# Patient Record
Sex: Female | Born: 1990 | Race: Black or African American | Hispanic: No | Marital: Married | Smoking: Never smoker
Health system: Southern US, Community
[De-identification: ages and names within clinical notes are randomized; demographics above are authoritative.]

## PROBLEM LIST (undated history)

## (undated) ENCOUNTER — Emergency Department (HOSPITAL_COMMUNITY): Admission: EM | Payer: No Typology Code available for payment source

## (undated) ENCOUNTER — Inpatient Hospital Stay (HOSPITAL_COMMUNITY): Payer: Self-pay

## (undated) DIAGNOSIS — O210 Mild hyperemesis gravidarum: Secondary | ICD-10-CM

## (undated) DIAGNOSIS — R87629 Unspecified abnormal cytological findings in specimens from vagina: Secondary | ICD-10-CM

## (undated) DIAGNOSIS — Z789 Other specified health status: Secondary | ICD-10-CM

## (undated) HISTORY — PX: NO PAST SURGERIES: SHX2092

---
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
## 2012-01-07 ENCOUNTER — Emergency Department (HOSPITAL_COMMUNITY)
Admission: EM | Admit: 2012-01-07 | Discharge: 2012-01-07 | Disposition: A | Discharge status: Home / Self Care | Payer: Self-pay | Attending: Emergency Medicine | Admitting: Emergency Medicine

## 2012-01-07 ENCOUNTER — Encounter (HOSPITAL_COMMUNITY): Payer: Self-pay | Admitting: Physical Medicine and Rehabilitation

## 2012-01-07 DIAGNOSIS — R350 Frequency of micturition: Secondary | ICD-10-CM | POA: Insufficient documentation

## 2012-01-07 LAB — POCT I-STAT, CHEM 8
BUN: 8 mg/dL (ref 6–23)
Creatinine, Ser: 0.8 mg/dL (ref 0.50–1.10)
Hemoglobin: 12.6 g/dL (ref 12.0–15.0)
Potassium: 4 mEq/L (ref 3.5–5.1)
Sodium: 141 mEq/L (ref 135–145)

## 2012-01-07 LAB — URINALYSIS, ROUTINE W REFLEX MICROSCOPIC
Glucose, UA: NEGATIVE mg/dL
Specific Gravity, Urine: 1.015 (ref 1.005–1.030)
pH: 6 (ref 5.0–8.0)

## 2012-01-07 LAB — URINE MICROSCOPIC-ADD ON

## 2012-01-07 NOTE — ED Provider Notes (Signed)
History     CSN: [id number]  Arrival date & time 01/07/12  1143   First MD Initiated Contact with Patient 01/07/12 1302      Chief Complaint  Patient presents with  . Urinary Frequency    (Consider location/radiation/quality/duration/timing/severity/associated sxs/prior treatment) Patient is a 21 y.o. female presenting with frequency. The history is provided by the patient.  Urinary Frequency   patient complains of 3 months of urinary frequency and urgency. Denies any fever or flank pain. Notes worsening symptoms over the past 3 days. Currently on her menstrual cycle. Denies any severe abdominal pain but does note some suprapubic pressure. Has been on Cipro Floxin and ditropan for same. Patient is currently seeing a physician but is unsure that person's name  No past medical history on file.  No past surgical history on file.  No family history on file.  History  Substance Use Topics  . Smoking status: Never Smoker   . Smokeless tobacco: Not on file  . Alcohol Use: No    OB History    Grav Para Term Preterm Abortions TAB SAB Ect Mult Living                  Review of Systems  Genitourinary: Positive for frequency.  All other systems reviewed and are negative.    Allergies  Review of patient's allergies indicates no known allergies.  Home Medications   Current Outpatient Rx  Name Route Sig Dispense Refill  . CIPROFLOXACIN HCL 500 MG PO TABS Oral Take 500 mg by mouth 2 (two) times daily.    . OXYBUTYNIN CHLORIDE ER 5 MG PO TB24 Oral Take 5 mg by mouth daily.      BP 132/81  Pulse 87  Temp 97.7 F (36.5 C) (Oral)  Resp 18  SpO2 100%  Physical Exam  Nursing note and vitals reviewed. Constitutional: She is oriented to person, place, and time. She appears well-developed and well-nourished.  Non-toxic appearance. No distress.  HENT:  Head: Normocephalic and atraumatic.  Eyes: Conjunctivae normal, EOM and lids are normal. Pupils are equal, round, and  reactive to light.  Neck: Normal range of motion. Neck supple. No tracheal deviation present. No mass present.  Cardiovascular: Normal rate, regular rhythm and normal heart sounds.  Exam reveals no gallop.   No murmur heard. Pulmonary/Chest: Effort normal and breath sounds normal. No stridor. No respiratory distress. She has no decreased breath sounds. She has no wheezes. She has no rhonchi. She has no rales.  Abdominal: Soft. Normal appearance and bowel sounds are normal. She exhibits no distension. There is no tenderness. There is no rigidity, no rebound, no guarding and no CVA tenderness.  Musculoskeletal: Normal range of motion. She exhibits no edema and no tenderness.  Neurological: She is alert and oriented to person, place, and time. She has normal strength. No cranial nerve deficit or sensory deficit. GCS eye subscore is 4. GCS verbal subscore is 5. GCS motor subscore is 6.  Skin: Skin is warm and dry. No abrasion and no rash noted.  Psychiatric: She has a normal mood and affect. Her speech is normal and behavior is normal.    ED Course  Procedures (including critical care time)  Labs Reviewed  URINALYSIS, ROUTINE W REFLEX MICROSCOPIC - Abnormal; Notable for the following:    APPearance HAZY (*)     Hgb urine dipstick LARGE (*)     Leukocytes, UA TRACE (*)     All other components within normal limits  URINE MICROSCOPIC-ADD ON - Abnormal; Notable for the following:    Squamous Epithelial / LPF FEW (*)     All other components within normal limits  URINE CULTURE   No results found.   No diagnosis found.    MDM  Urine pregnancy test negative. Symptoms have been long-standing for 3 months. Patient to be given referral to urology        Toy Baker, MD 01/07/12 1451

## 2012-01-07 NOTE — ED Notes (Signed)
Pt presents to department for evaluation of lower back pain and urinary frequency. Denies dysuria. Symptoms ongoing x3 months, pain has become worse over the past several days. 8/10 pain at the time. She is alert and oriented x4. No signs of distress noted at present.

## 2012-01-08 LAB — URINE CULTURE: Colony Count: 45000

## 2012-01-09 LAB — POCT PREGNANCY, URINE: Preg Test, Ur: NEGATIVE

## 2014-01-02 ENCOUNTER — Other Ambulatory Visit: Payer: Self-pay | Admitting: Infectious Disease

## 2014-01-02 ENCOUNTER — Ambulatory Visit
Admission: RE | Admit: 2014-01-02 | Discharge: 2014-01-02 | Disposition: A | Discharge status: Home / Self Care | Payer: No Typology Code available for payment source | Source: Ambulatory Visit | Attending: Infectious Disease | Admitting: Infectious Disease

## 2014-01-02 DIAGNOSIS — R7611 Nonspecific reaction to tuberculin skin test without active tuberculosis: Secondary | ICD-10-CM

## 2016-04-18 NOTE — L&D Delivery Note (Signed)
Delivery Note At 12:26 PM a viable female was delivered via Vaginal, Spontaneous (Presentation: ROA).  APGAR: 8, 10; weight pending.   Placenta status: Spontaneous with trailing membranes removed after delivery of placenta.  Cord: 3 vessels  After delivery of placenta, fundus remained boggy with brisk bright red blood flow. Pitocin bolus started, 800mg  cytotec, and methergine 0.2 given. Fundus then firm with minimal bleeding.   Third degree laceration noted. Dr. Alvester MorinNewton at bedside for repair  Anesthesia:  none Episiotomy: None Lacerations: 3rd degree, repaired by Dr. Alvester MorinNewton Suture Repair: 3.0 vicryl Est. Blood Loss (mL): 300  Mom to postpartum.  Baby to Couplet care / Skin to Skin.  Rolm BookbinderCaroline M Neill CNM 03/22/2017, 12:56 PM  Please schedule this patient for PP visit in: 4 weeks Low risk pregnancy complicated by: n/a Delivery mode:  SVD Anticipated Birth Control:  other/unsure PP Procedures needed: vaginal laceration checked  Schedule Integrated BH visit: no Provider: Any provider

## 2016-07-29 ENCOUNTER — Encounter (HOSPITAL_COMMUNITY): Payer: Self-pay | Admitting: Emergency Medicine

## 2016-07-29 ENCOUNTER — Emergency Department (HOSPITAL_COMMUNITY)
Admission: EM | Admit: 2016-07-29 | Discharge: 2016-07-29 | Disposition: A | Discharge status: Home / Self Care | Payer: Medicaid Other | Attending: Emergency Medicine | Admitting: Emergency Medicine

## 2016-07-29 DIAGNOSIS — Z3A01 Less than 8 weeks gestation of pregnancy: Secondary | ICD-10-CM | POA: Insufficient documentation

## 2016-07-29 DIAGNOSIS — O209 Hemorrhage in early pregnancy, unspecified: Secondary | ICD-10-CM | POA: Diagnosis not present

## 2016-07-29 DIAGNOSIS — O4690 Antepartum hemorrhage, unspecified, unspecified trimester: Secondary | ICD-10-CM

## 2016-07-29 DIAGNOSIS — N9489 Other specified conditions associated with female genital organs and menstrual cycle: Secondary | ICD-10-CM | POA: Insufficient documentation

## 2016-07-29 LAB — WET PREP, GENITAL
Clue Cells Wet Prep HPF POC: NONE SEEN
Trich, Wet Prep: NONE SEEN
Yeast Wet Prep HPF POC: NONE SEEN

## 2016-07-29 LAB — URINALYSIS, ROUTINE W REFLEX MICROSCOPIC
Bilirubin Urine: NEGATIVE
Glucose, UA: NEGATIVE mg/dL
Hgb urine dipstick: NEGATIVE
KETONES UR: NEGATIVE mg/dL
LEUKOCYTES UA: NEGATIVE
Nitrite: NEGATIVE
PROTEIN: NEGATIVE mg/dL
Specific Gravity, Urine: 1.009 (ref 1.005–1.030)
pH: 6 (ref 5.0–8.0)

## 2016-07-29 LAB — HCG, QUANTITATIVE, PREGNANCY: HCG, BETA CHAIN, QUANT, S: 12364 m[IU]/mL — AB (ref <5)

## 2016-07-29 LAB — ABO/RH: ABO/RH(D): O POS

## 2016-07-29 MED ORDER — SODIUM CHLORIDE 0.9 % IV BOLUS (SEPSIS)
1000.0000 mL | Freq: Once | INTRAVENOUS | Status: AC
  Administered 2016-07-29: 1000 mL via INTRAVENOUS

## 2016-07-29 NOTE — ED Provider Notes (Signed)
Patient's pelvic exam reveals no bleeding or discharge wet prep reviewed all within normal parameters hCG reveals a quantitative level of 12,364 she is O+ She was put on pelvic rest with follow-up with OB/GYN.   Earley Favor, NP 07/29/16 2246    Marily Memos, MD 07/29/16 [phone]

## 2016-07-29 NOTE — Discharge Instructions (Signed)
Tonight her examination is normal I recommend pelvic rest until you're seen by her OB/GYN give been given a referral to [facility] please: Make an appointment.

## 2016-07-29 NOTE — ED Provider Notes (Signed)
[facility]-EMERGENCY DEPT Provider Note   CSN: [id number] Arrival date & time: 07/29/16  1431     History   Chief Complaint Chief Complaint  Patient presents with  . Vaginal Bleeding    HPI Lisa Reese is a 26 y.o. female.  Pt presents w intermittent vaginal bleeding that began today. Describes bleeding to be a small amount on tissue. Reports positive home pregnancy test on April 1 and yesterday. Denies prenatal care or prenatal vitamins. Denies abd pain, dysuria, F/C.       History reviewed. No pertinent past medical history.  There are no active problems to display for this patient.   History reviewed. No pertinent surgical history.  OB History    Gravida Para Term Preterm AB Living   1             SAB TAB Ectopic Multiple Live Births                   Home Medications    Prior to Admission medications   Not on File    Family History History reviewed. No pertinent family history.  Social History Social History  Substance Use Topics  . Smoking status: Never Smoker  . Smokeless tobacco: Never Used  . Alcohol use No     Allergies   Patient has no known allergies.   Review of Systems Review of Systems  Constitutional: Negative for chills and fever.  Gastrointestinal: Negative for abdominal pain.  Genitourinary: Positive for vaginal bleeding. Negative for dysuria and pelvic pain.     Physical Exam Updated Vital Signs BP 119/79 (BP Location: Left Arm)   Pulse 76   Temp 98.6 F (37 C) (Oral)   Resp 16   Ht  (1.549 m)   Wt 63.5 kg   SpO2 100%   BMI 26.45 kg/m   Physical Exam  Constitutional: She appears well-developed and well-nourished.  HENT:  Head: Normocephalic and atraumatic.  Eyes: Conjunctivae are normal.  Cardiovascular: Normal rate, regular rhythm, normal heart sounds and intact distal pulses.  Exam reveals no friction rub.   No murmur heard. Pulmonary/Chest: Effort normal.  Abdominal: Soft. Bowel sounds are normal.  She exhibits no distension. There is no tenderness.  Psychiatric: She has a normal mood and affect. Her behavior is normal.  Nursing note and vitals reviewed.    ED Treatments / Results  Labs (all labs ordered are listed, but only abnormal results are displayed) Labs Reviewed  WET PREP, GENITAL  HCG, QUANTITATIVE, PREGNANCY  ABO/RH  GC/CHLAMYDIA PROBE AMP () NOT AT [facility]    EKG  EKG Interpretation None       Radiology No results found.  Procedures Procedures (including critical care time)  Medications Ordered in ED Medications  sodium chloride 0.9 % bolus 1,000 mL (not administered)     Initial Impression / Assessment and Plan / ED Course  I have reviewed the triage vital signs and the nursing notes.  Pertinent labs & imaging results that were available during my care of the patient were reviewed by me and considered in my medical decision making (see chart for details).     Pt w mild vaginal bleeding that began today. Positive home pregnancy tests x2. No abdl pain. Physical exam w/o abdl tenderness. HCG quantitative pending.   Care assumed by Earley Favor, NP for further workup.   Pt discussed w Dr. Clayborne Dana.  Final Clinical Impressions(s) / ED Diagnoses   Final diagnoses:  None  New Prescriptions New Prescriptions   No medications on file     [place] N Russo, PA-C 07/29/16 2027    [place] N Russo, PA-C 07/29/16 2029    Marily Memos, MD 07/29/16 [phone]

## 2016-07-29 NOTE — ED Triage Notes (Signed)
Pt presents to ED for assessment of blood noted on the napkin when wiping today.   Patient states she just found out she is pregnant as of yesterday, LMP lasrt month.  Pt denies any other associated symptoms.

## 2016-08-01 LAB — GC/CHLAMYDIA PROBE AMP (~~LOC~~) NOT AT ARMC
Chlamydia: NEGATIVE
Neisseria Gonorrhea: NEGATIVE

## 2016-08-06 ENCOUNTER — Encounter (HOSPITAL_COMMUNITY): Payer: Self-pay | Admitting: *Deleted

## 2016-08-06 ENCOUNTER — Inpatient Hospital Stay (HOSPITAL_COMMUNITY)
Admission: AD | Admit: 2016-08-06 | Discharge: 2016-08-07 | Disposition: A | Discharge status: Home / Self Care | Payer: Medicaid Other | Source: Ambulatory Visit | Attending: Obstetrics and Gynecology | Admitting: Obstetrics and Gynecology

## 2016-08-06 DIAGNOSIS — O99611 Diseases of the digestive system complicating pregnancy, first trimester: Secondary | ICD-10-CM | POA: Diagnosis not present

## 2016-08-06 DIAGNOSIS — Z3A01 Less than 8 weeks gestation of pregnancy: Secondary | ICD-10-CM | POA: Diagnosis not present

## 2016-08-06 DIAGNOSIS — O26891 Other specified pregnancy related conditions, first trimester: Secondary | ICD-10-CM

## 2016-08-06 DIAGNOSIS — O219 Vomiting of pregnancy, unspecified: Secondary | ICD-10-CM

## 2016-08-06 DIAGNOSIS — R12 Heartburn: Secondary | ICD-10-CM | POA: Insufficient documentation

## 2016-08-06 LAB — COMPREHENSIVE METABOLIC PANEL
ALK PHOS: 45 U/L (ref 38–126)
ALT: 14 U/L (ref 14–54)
AST: 20 U/L (ref 15–41)
Albumin: 4.5 g/dL (ref 3.5–5.0)
Anion gap: 8 (ref 5–15)
BILIRUBIN TOTAL: 0.5 mg/dL (ref 0.3–1.2)
BUN: 13 mg/dL (ref 6–20)
CALCIUM: 9.6 mg/dL (ref 8.9–10.3)
CHLORIDE: 105 mmol/L (ref 101–111)
CO2: 25 mmol/L (ref 22–32)
CREATININE: 0.71 mg/dL (ref 0.44–1.00)
GFR calc Af Amer: >60 mL/min (ref >60)
Glucose, Bld: 101 mg/dL — ABNORMAL HIGH (ref 65–99)
Potassium: 3.6 mmol/L (ref 3.5–5.1)
Sodium: 138 mmol/L (ref 135–145)
Total Protein: 9 g/dL — ABNORMAL HIGH (ref 6.5–8.1)

## 2016-08-06 LAB — CBC
HEMATOCRIT: 35.5 % — AB (ref 36.0–46.0)
HEMOGLOBIN: 12.2 g/dL (ref 12.0–15.0)
MCH: 30 pg (ref 26.0–34.0)
MCHC: 34.4 g/dL (ref 30.0–36.0)
MCV: 87.4 fL (ref 78.0–100.0)
PLATELETS: 340 10*3/uL (ref 150–400)
RBC: 4.06 MIL/uL (ref 3.87–5.11)
RDW: 12.6 % (ref 11.5–15.5)
WBC: 7.5 10*3/uL (ref 4.0–10.5)

## 2016-08-06 LAB — URINALYSIS, ROUTINE W REFLEX MICROSCOPIC
Bilirubin Urine: NEGATIVE
Glucose, UA: NEGATIVE mg/dL
Hgb urine dipstick: NEGATIVE
Ketones, ur: 80 mg/dL — AB
Leukocytes, UA: NEGATIVE
Nitrite: NEGATIVE
Protein, ur: 100 mg/dL — AB
SPECIFIC GRAVITY, URINE: 1.03 (ref 1.005–1.030)
pH: 6 (ref 5.0–8.0)

## 2016-08-06 MED ORDER — DEXTROSE 5 % IN LACTATED RINGERS IV BOLUS
1000.0000 mL | Freq: Once | INTRAVENOUS | Status: AC
  Administered 2016-08-06: 1000 mL via INTRAVENOUS

## 2016-08-06 MED ORDER — FAMOTIDINE IN NACL 20-0.9 MG/50ML-% IV SOLN
20.0000 mg | Freq: Once | INTRAVENOUS | Status: AC
  Administered 2016-08-06: 20 mg via INTRAVENOUS
  Filled 2016-08-06: qty 50

## 2016-08-06 MED ORDER — PROMETHAZINE HCL 25 MG/ML IJ SOLN
25.0000 mg | Freq: Once | INTRAMUSCULAR | Status: AC
  Administered 2016-08-06: 25 mg via INTRAVENOUS
  Filled 2016-08-06: qty 1

## 2016-08-06 NOTE — MAU Provider Note (Signed)
Chief Complaint: Emesis During Pregnancy   First Provider Initiated Contact with Patient 08/06/16 2311      SUBJECTIVE HPI: Lisa Reese is a 26 y.o. G1P0 at [redacted]w[redacted]d by LMP who presents to maternity admissions reporting nausea/vomiting x 4 days with dark blood in her emesis today and chest burning.  She reports vomiting 4-5 times in 24 hours and being unable to keep down any food or fluids.  She has tried eating small bland meals which has not helped. She has not tried any medications. She has a New OB appointment scheduled in [facility] next month.  She denies abdominal pain, vaginal bleeding, or any other symptoms. She denies vaginal itching/burning, urinary symptoms, h/a, dizziness,or fever/chills.     HPI  Past Medical History:  Diagnosis Date  . Medical history non-contributory    Past Surgical History:  Procedure Laterality Date  . NO PAST SURGERIES     Social History   Social History  . Marital status: Married    Spouse name: N/A  . Number of children: N/A  . Years of education: N/A   Occupational History  . Not on file.   Social History Main Topics  . Smoking status: Never Smoker  . Smokeless tobacco: Never Used  . Alcohol use No  . Drug use: No  . Sexual activity: Yes    Birth control/ protection: None   Other Topics Concern  . Not on file   Social History Narrative  . No narrative on file   No current facility-administered medications on file prior to encounter.    No current outpatient prescriptions on file prior to encounter.   No Known Allergies  ROS:  Review of Systems  Constitutional: Negative for chills, fatigue and fever.  Respiratory: Negative for shortness of breath.   Cardiovascular: Negative for chest pain.  Gastrointestinal: Positive for nausea and vomiting. Negative for constipation and diarrhea.  Genitourinary: Negative for difficulty urinating, dysuria, flank pain, pelvic pain, vaginal bleeding, vaginal discharge and vaginal pain.   Neurological: Negative for dizziness and headaches.  Psychiatric/Behavioral: Negative.      I have reviewed patient's Past Medical Hx, Surgical Hx, Family Hx, Social Hx, medications and allergies.   Physical Exam   Patient Vitals for the past 24 hrs:  BP Temp Temp src Pulse Resp  08/07/16 0152 (!) 101/47 - - 85 18  08/06/16 2213 117/68 97.9 F (36.6 C) Oral 81 18   Constitutional: Well-developed, well-nourished female in moderate distress.  Cardiovascular: normal rate Respiratory: normal effort GI: Abd soft, non-tender. Pos BS x 4 MS: Extremities nontender, no edema, normal ROM Neurologic: Alert and oriented x 4.  GU: Neg CVAT.  PELVIC EXAM: Deferred   LAB RESULTS Results for orders placed or performed during the hospital encounter of 08/06/16 (from the past 24 hour(s))  Urinalysis, Routine w reflex microscopic     Status: Abnormal   Collection Time: 08/06/16 10:18 PM  Result Value Ref Range   Color, Urine YELLOW YELLOW   APPearance HAZY (A) CLEAR   Specific Gravity, Urine 1.030 1.005 - 1.030   pH 6.0 5.0 - 8.0   Glucose, UA NEGATIVE NEGATIVE mg/dL   Hgb urine dipstick NEGATIVE NEGATIVE   Bilirubin Urine NEGATIVE NEGATIVE   Ketones, ur 80 (A) NEGATIVE mg/dL   Protein, ur 161 (A) NEGATIVE mg/dL   Nitrite NEGATIVE NEGATIVE   Leukocytes, UA NEGATIVE NEGATIVE   RBC / HPF 0-5 0 - 5 RBC/hpf   WBC, UA 0-5 0 - 5 WBC/hpf  Bacteria, UA RARE (A) NONE SEEN   Squamous Epithelial / LPF 6-30 (A) NONE SEEN   Mucous PRESENT   CBC     Status: Abnormal   Collection Time: 08/06/16 10:54 PM  Result Value Ref Range   WBC 7.5 4.0 - 10.5 K/uL   RBC 4.06 3.87 - 5.11 MIL/uL   Hemoglobin 12.2 12.0 - 15.0 g/dL   HCT 16.1 (L) 09.6 - 04.5 %   MCV 87.4 78.0 - 100.0 fL   MCH 30.0 26.0 - 34.0 pg   MCHC 34.4 30.0 - 36.0 g/dL   RDW 40.9 81.1 - 91.4 %   Platelets 340 150 - 400 K/uL  Comprehensive metabolic panel     Status: Abnormal   Collection Time: 08/06/16 10:54 PM  Result Value Ref  Range   Sodium 138 135 - 145 mmol/L   Potassium 3.6 3.5 - 5.1 mmol/L   Chloride 105 101 - 111 mmol/L   CO2 25 22 - 32 mmol/L   Glucose, Bld 101 (H) 65 - 99 mg/dL   BUN 13 6 - 20 mg/dL   Creatinine, Ser 7.82 0.44 - 1.00 mg/dL   Calcium 9.6 8.9 - 95.6 mg/dL   Total Protein 9.0 (H) 6.5 - 8.1 g/dL   Albumin 4.5 3.5 - 5.0 g/dL   AST 20 15 - 41 U/L   ALT 14 14 - 54 U/L   Alkaline Phosphatase 45 38 - 126 U/L   Total Bilirubin 0.5 0.3 - 1.2 mg/dL   GFR calc non Af Amer >60 >60 mL/min   GFR calc Af Amer >60 >60 mL/min   Anion gap 8 5 - 15    --/--/O POS (04/13 2012)  IMAGING No results found.  MAU Management/MDM: Ordered CBC, CMP, UA and reviewed results.  D5LR x 1000 ml, Phenergan 25 mg IV, and Pepcid 20 mg IV given with complete resolution of symptoms. Rx for Phenergan 12.5-25 mg PO Q 6 hours and Zantac 150 mg PO BID.  FU with prenatal care as scheduled. Return to MAU as needed for emergencies.  Pt stable at time of discharge.  ASSESSMENT 1. Nausea and vomiting during pregnancy prior to [redacted] weeks gestation   2. Heartburn during pregnancy in first trimester     PLAN Discharge home Allergies as of 08/07/2016   No Known Allergies     Medication List    TAKE these medications   promethazine 25 MG tablet Commonly known as:  PHENERGAN Take 0.5-1 tablets (12.5-25 mg total) by mouth every 6 (six) hours as needed for nausea.   ranitidine 150 MG tablet Commonly known as:  ZANTAC Take 1 tablet (150 mg total) by mouth 2 (two) times daily.      Follow-up Information    [facility] Follow up.   Specialty:  Obstetrics and Gynecology Why:  As scheduled, return to MAU as needed for emergencies Contact information: [address] [place] [address] [phone]          Sharen Counter Certified Nurse-Midwife 08/07/2016  2:55 AM

## 2016-08-06 NOTE — MAU Note (Signed)
p reports vomiting x 4 days. Stated emesis looks like coffee grounds now. C/o throat pain and burning in her chest.

## 2016-08-07 DIAGNOSIS — O219 Vomiting of pregnancy, unspecified: Secondary | ICD-10-CM

## 2016-08-07 MED ORDER — LACTATED RINGERS IV BOLUS (SEPSIS)
1000.0000 mL | Freq: Once | INTRAVENOUS | Status: AC
  Administered 2016-08-07: 1000 mL via INTRAVENOUS

## 2016-08-07 MED ORDER — PROMETHAZINE HCL 25 MG PO TABS: 12.5000 mg | ORAL_TABLET | Freq: Four times a day (QID) | ORAL | 2 refills | Status: DC | PRN

## 2016-08-07 MED ORDER — RANITIDINE HCL 150 MG PO TABS: 150.0000 mg | ORAL_TABLET | Freq: Two times a day (BID) | ORAL | 3 refills | Status: DC

## 2016-08-27 ENCOUNTER — Encounter (HOSPITAL_COMMUNITY): Payer: Self-pay | Admitting: Emergency Medicine

## 2016-08-27 DIAGNOSIS — R1030 Lower abdominal pain, unspecified: Secondary | ICD-10-CM | POA: Insufficient documentation

## 2016-08-27 DIAGNOSIS — O219 Vomiting of pregnancy, unspecified: Secondary | ICD-10-CM | POA: Insufficient documentation

## 2016-08-27 DIAGNOSIS — Z3A09 9 weeks gestation of pregnancy: Secondary | ICD-10-CM | POA: Insufficient documentation

## 2016-08-27 LAB — COMPREHENSIVE METABOLIC PANEL
ALBUMIN: 4.1 g/dL (ref 3.5–5.0)
ALK PHOS: 37 U/L — AB (ref 38–126)
ALT: 16 U/L (ref 14–54)
AST: 19 U/L (ref 15–41)
Anion gap: 9 (ref 5–15)
BILIRUBIN TOTAL: 0.6 mg/dL (ref 0.3–1.2)
BUN: 10 mg/dL (ref 6–20)
CO2: 22 mmol/L (ref 22–32)
Calcium: 9.7 mg/dL (ref 8.9–10.3)
Chloride: 102 mmol/L (ref 101–111)
Creatinine, Ser: 0.71 mg/dL (ref 0.44–1.00)
GFR calc Af Amer: >60 mL/min (ref >60)
GFR calc non Af Amer: >60 mL/min (ref >60)
GLUCOSE: 97 mg/dL (ref 65–99)
POTASSIUM: 3.4 mmol/L — AB (ref 3.5–5.1)
Sodium: 133 mmol/L — ABNORMAL LOW (ref 135–145)
TOTAL PROTEIN: 8.1 g/dL (ref 6.5–8.1)

## 2016-08-27 LAB — CBC
HEMATOCRIT: 32.9 % — AB (ref 36.0–46.0)
Hemoglobin: 11.2 g/dL — ABNORMAL LOW (ref 12.0–15.0)
MCH: 29.9 pg (ref 26.0–34.0)
MCHC: 34 g/dL (ref 30.0–36.0)
MCV: 87.7 fL (ref 78.0–100.0)
PLATELETS: 327 10*3/uL (ref 150–400)
RBC: 3.75 MIL/uL — ABNORMAL LOW (ref 3.87–5.11)
RDW: 12.7 % (ref 11.5–15.5)
WBC: 6.8 10*3/uL (ref 4.0–10.5)

## 2016-08-27 LAB — URINALYSIS, ROUTINE W REFLEX MICROSCOPIC
BILIRUBIN URINE: NEGATIVE
Glucose, UA: NEGATIVE mg/dL
Ketones, ur: 80 mg/dL — AB
Leukocytes, UA: NEGATIVE
Nitrite: NEGATIVE
PH: 5 (ref 5.0–8.0)
Protein, ur: 30 mg/dL — AB
SPECIFIC GRAVITY, URINE: 1.03 (ref 1.005–1.030)

## 2016-08-27 LAB — I-STAT BETA HCG BLOOD, ED (MC, WL, AP ONLY): I-stat hCG, quantitative: >2000 m[IU]/mL — ABNORMAL HIGH (ref <5)

## 2016-08-27 NOTE — ED Notes (Addendum)
No urine in cup, order cancelled per Lab.  Will reorder.

## 2016-08-27 NOTE — ED Triage Notes (Signed)
Pt presents to ED for assessment of streaks of blood noted in her emesis.  Pt is currently pregnant (approx 1.5 months).  Has not seen an OB/Gyn yet. States constant nausea during pregnancy with difficulty eating or drinking.  Denies any pain.

## 2016-08-28 ENCOUNTER — Emergency Department (HOSPITAL_COMMUNITY)
Admission: EM | Admit: 2016-08-28 | Discharge: 2016-08-28 | Disposition: A | Discharge status: Home / Self Care | Payer: Medicaid Other | Attending: Emergency Medicine | Admitting: Emergency Medicine

## 2016-08-28 ENCOUNTER — Emergency Department (HOSPITAL_COMMUNITY): Payer: Medicaid Other

## 2016-08-28 DIAGNOSIS — R112 Nausea with vomiting, unspecified: Secondary | ICD-10-CM

## 2016-08-28 MED ORDER — SODIUM CHLORIDE 0.9 % IV SOLN
1000.0000 mL | Freq: Once | INTRAVENOUS | Status: AC
  Administered 2016-08-28: 1000 mL via INTRAVENOUS

## 2016-08-28 MED ORDER — DOXYLAMINE SUCCINATE (SLEEP) 25 MG PO TABS: 25.0000 mg | ORAL_TABLET | Freq: Three times a day (TID) | ORAL | 0 refills | Status: DC | PRN

## 2016-08-28 MED ORDER — SODIUM CHLORIDE 0.9 % IV SOLN
1000.0000 mL | INTRAVENOUS | Status: DC
Start: 2016-08-28 — End: 2016-08-28
  Administered 2016-08-28: 1000 mL via INTRAVENOUS

## 2016-08-28 MED ORDER — ONDANSETRON HCL 4 MG/2ML IJ SOLN
4.0000 mg | Freq: Once | INTRAMUSCULAR | Status: AC
  Administered 2016-08-28: 4 mg via INTRAVENOUS
  Filled 2016-08-28: qty 2

## 2016-08-28 NOTE — ED Notes (Signed)
Pt returned from Ultrasound.

## 2016-08-28 NOTE — ED Provider Notes (Signed)
[facility] DEPT Provider Note   CSN: [id number] Arrival date & time: 08/27/16  1954  By signing my name below, I, Lisa Reese, attest that this documentation has been prepared under the direction and in the presence of physician practitioner, Azalia Bilisampos, Shera Laubach, MD. Electronically Signed: Linna Darnerussell Reese, Scribe. 08/28/2016. 1:09 AM.  History   Chief Complaint Chief Complaint  Patient presents with  . Emesis  . Hematemesis   The history is provided by the patient. No language interpreter was used.    HPI Comments: Lisa Reese is a 26 y.o. female who presents to the Emergency Department complaining of persistent nausea and vomiting beginning on 08/26/16. Patient states her emesis has been both bloody and non-bloody but was non-bloody initially. Patient reports some suprapubic pain secondary to vomiting as well as some urinary urgency with a couple of episodes of incontinence. No alleviating factors noted. Patient discovered that she was pregnant last month and has not had any ultrasounds or evaluations by an OB/GYN. She denies diarrhea, dysuria, difficulty urinating, or any other associated symptoms.  Past Medical History:  Diagnosis Date  . Medical history non-contributory     There are no active problems to display for this patient.   Past Surgical History:  Procedure Laterality Date  . NO PAST SURGERIES      OB History    Gravida Para Term Preterm AB Living   1             SAB TAB Ectopic Multiple Live Births                   Home Medications    Prior to Admission medications   Medication Sig Start Date End Date Taking? Authorizing Provider  promethazine (PHENERGAN) 25 MG tablet Take 0.5-1 tablets (12.5-25 mg total) by mouth every 6 (six) hours as needed for nausea. 08/07/16   Leftwich-Kirby, Wilmer FloorLisa A, CNM  ranitidine (ZANTAC) 150 MG tablet Take 1 tablet (150 mg total) by mouth 2 (two) times daily. 08/07/16   Leftwich-Kirby, Wilmer FloorLisa A, CNM    Family  History History reviewed. No pertinent family history.  Social History Social History  Substance Use Topics  . Smoking status: Never Smoker  . Smokeless tobacco: Never Used  . Alcohol use No     Allergies   Patient has no known allergies.   Review of Systems Review of Systems  All other systems reviewed and are negative for acute change except as noted in the HPI. Physical Exam Updated Vital Signs BP 111/60 (BP Location: Right Arm)   Pulse 82   Temp 98.2 F (36.8 C) (Oral)   Resp 12   LMP 06/22/2016   SpO2 100%   Physical Exam  Constitutional: She is oriented to person, place, and time. She appears well-developed and well-nourished. No distress.  HENT:  Head: Normocephalic and atraumatic.  Eyes: EOM are normal.  Neck: Normal range of motion.  Cardiovascular: Normal rate, regular rhythm and normal heart sounds.   Pulmonary/Chest: Effort normal and breath sounds normal.  Abdominal: Soft. She exhibits no distension. There is tenderness.  Lower abdominal tenderness.  Musculoskeletal: Normal range of motion.  Neurological: She is alert and oriented to person, place, and time.  Skin: Skin is warm and dry.  Psychiatric: She has a normal mood and affect. Judgment normal.  Nursing note and vitals reviewed.  ED Treatments / Results  Labs (all labs ordered are listed, but only abnormal results are displayed) Labs Reviewed  COMPREHENSIVE METABOLIC PANEL - Abnormal; Notable  for the following:       Result Value   Sodium 133 (*)    Potassium 3.4 (*)    Alkaline Phosphatase 37 (*)    All other components within normal limits  CBC - Abnormal; Notable for the following:    RBC 3.75 (*)    Hemoglobin 11.2 (*)    HCT 32.9 (*)    All other components within normal limits  URINALYSIS, ROUTINE W REFLEX MICROSCOPIC - Abnormal; Notable for the following:    APPearance HAZY (*)    Hgb urine dipstick SMALL (*)    Ketones, ur 80 (*)    Protein, ur 30 (*)    Bacteria, UA RARE (*)     Squamous Epithelial / LPF 0-5 (*)    All other components within normal limits  I-STAT BETA HCG BLOOD, ED ([facility], [facility], [facility] ONLY) - Abnormal; Notable for the following:    I-stat hCG, quantitative >2,000.0 (*)    All other components within normal limits    EKG  EKG Interpretation None       Radiology No results found.  Procedures Procedures (including critical care time)  DIAGNOSTIC STUDIES: Oxygen Saturation is 100% on RA, normal by my interpretation.    COORDINATION OF CARE: 1:08 AM Discussed treatment plan with pt at bedside and pt agreed to plan.  Medications Ordered in ED Medications  0.9 %  sodium chloride infusion (0 mLs Intravenous Stopped 08/28/16 0444)    Followed by  0.9 %  sodium chloride infusion (1,000 mLs Intravenous New Bag/Given 08/28/16 0448)  ondansetron (ZOFRAN) injection 4 mg (4 mg Intravenous Given 08/28/16 0120)     Initial Impression / Assessment and Plan / ED Course  I have reviewed the triage vital signs and the nursing notes.  Pertinent labs & imaging results that were available during my care of the patient were reviewed by me and considered in my medical decision making (see chart for details).     Vomiting with likely mild Mallory-Weiss tear.  No hematemesis while in the emergency department.  Feels much better after fluids and nausea medication.  Close OB follow-up.  Normal intrauterine pregnancy.  Home with [employer]  Final Clinical Impressions(s) / ED Diagnoses   Final diagnoses:  Non-intractable vomiting with nausea, unspecified vomiting type    New Prescriptions New Prescriptions   DOXYLAMINE, SLEEP, (UNISOM) 25 MG TABLET    Take 1 tablet (25 mg total) by mouth every 8 (eight) hours as needed (nausea).   I personally performed the services described in this documentation, which was scribed in my presence. The recorded information has been reviewed and is accurate.       Azalia Bilis, MD 08/28/16 [phone]

## 2016-08-28 NOTE — ED Notes (Signed)
Patient transported to Ultrasound 

## 2016-09-05 ENCOUNTER — Encounter (HOSPITAL_COMMUNITY): Payer: Self-pay | Admitting: Emergency Medicine

## 2016-09-05 DIAGNOSIS — R112 Nausea with vomiting, unspecified: Secondary | ICD-10-CM | POA: Insufficient documentation

## 2016-09-05 DIAGNOSIS — Z79899 Other long term (current) drug therapy: Secondary | ICD-10-CM | POA: Diagnosis not present

## 2016-09-05 NOTE — ED Notes (Signed)
Delay in lab draw,  Pt vomiting 

## 2016-09-05 NOTE — ED Triage Notes (Signed)
Pt to ED from home c/o hematemesis and "coughing up a little blood" for months now. Patient is [redacted] weeks pregnant and states she's tried every nausea medication but nothing helps anymore. Patient also c/o intermittent abd pain and heartburn, but denies any at this time.

## 2016-09-06 ENCOUNTER — Emergency Department (HOSPITAL_COMMUNITY): Payer: Medicaid Other

## 2016-09-06 ENCOUNTER — Emergency Department (HOSPITAL_COMMUNITY)
Admission: EM | Admit: 2016-09-06 | Discharge: 2016-09-06 | Disposition: A | Discharge status: Home / Self Care | Payer: Medicaid Other | Attending: Emergency Medicine | Admitting: Emergency Medicine

## 2016-09-06 DIAGNOSIS — R112 Nausea with vomiting, unspecified: Secondary | ICD-10-CM

## 2016-09-06 DIAGNOSIS — O210 Mild hyperemesis gravidarum: Secondary | ICD-10-CM

## 2016-09-06 LAB — CBC
HCT: 33.3 % — ABNORMAL LOW (ref 36.0–46.0)
Hemoglobin: 11.2 g/dL — ABNORMAL LOW (ref 12.0–15.0)
MCH: 29.9 pg (ref 26.0–34.0)
MCHC: 33.6 g/dL (ref 30.0–36.0)
MCV: 89 fL (ref 78.0–100.0)
Platelets: 340 10*3/uL (ref 150–400)
RBC: 3.74 MIL/uL — ABNORMAL LOW (ref 3.87–5.11)
RDW: 13.2 % (ref 11.5–15.5)
WBC: 8.7 10*3/uL (ref 4.0–10.5)

## 2016-09-06 LAB — URINALYSIS, ROUTINE W REFLEX MICROSCOPIC
BILIRUBIN URINE: NEGATIVE
GLUCOSE, UA: NEGATIVE mg/dL
HGB URINE DIPSTICK: NEGATIVE
KETONES UR: NEGATIVE mg/dL
Leukocytes, UA: NEGATIVE
Nitrite: NEGATIVE
PROTEIN: NEGATIVE mg/dL
Specific Gravity, Urine: 1.021 (ref 1.005–1.030)
pH: 9 — ABNORMAL HIGH (ref 5.0–8.0)

## 2016-09-06 LAB — COMPREHENSIVE METABOLIC PANEL
ALK PHOS: 32 U/L — AB (ref 38–126)
ALT: 7 U/L — AB (ref 14–54)
AST: 38 U/L (ref 15–41)
Albumin: 3.8 g/dL (ref 3.5–5.0)
Anion gap: 7 (ref 5–15)
BUN: 6 mg/dL (ref 6–20)
CALCIUM: 9.1 mg/dL (ref 8.9–10.3)
CHLORIDE: 105 mmol/L (ref 101–111)
CO2: 21 mmol/L — AB (ref 22–32)
Creatinine, Ser: 0.64 mg/dL (ref 0.44–1.00)
GFR calc Af Amer: >60 mL/min (ref >60)
GFR calc non Af Amer: >60 mL/min (ref >60)
Glucose, Bld: 81 mg/dL (ref 65–99)
Potassium: 4.8 mmol/L (ref 3.5–5.1)
SODIUM: 133 mmol/L — AB (ref 135–145)
Total Bilirubin: 1.3 mg/dL — ABNORMAL HIGH (ref 0.3–1.2)
Total Protein: 7.2 g/dL (ref 6.5–8.1)

## 2016-09-06 LAB — POC URINE PREG, ED: Preg Test, Ur: POSITIVE — AB

## 2016-09-06 MED ORDER — SODIUM CHLORIDE 0.9 % IV BOLUS (SEPSIS)
1000.0000 mL | Freq: Once | INTRAVENOUS | Status: AC
  Administered 2016-09-06: 1000 mL via INTRAVENOUS

## 2016-09-06 MED ORDER — METOCLOPRAMIDE HCL 5 MG/ML IJ SOLN
5.0000 mg | Freq: Once | INTRAMUSCULAR | Status: AC
  Administered 2016-09-06: 5 mg via INTRAVENOUS
  Filled 2016-09-06: qty 2

## 2016-09-06 MED ORDER — THIAMINE HCL 100 MG/ML IJ SOLN
100.0000 mg | Freq: Every day | INTRAMUSCULAR | Status: DC
  Administered 2016-09-06: 100 mg via INTRAVENOUS
  Filled 2016-09-06: qty 2

## 2016-09-06 MED ORDER — METOCLOPRAMIDE HCL 10 MG PO TABS: 10.0000 mg | ORAL_TABLET | Freq: Four times a day (QID) | ORAL | 0 refills | Status: DC | PRN

## 2016-09-06 MED ORDER — DIPHENHYDRAMINE HCL 50 MG/ML IJ SOLN
12.5000 mg | Freq: Once | INTRAMUSCULAR | Status: AC
Start: 2016-09-06 — End: 2016-09-06
  Administered 2016-09-06: 12.5 mg via INTRAVENOUS
  Filled 2016-09-06: qty 1

## 2016-09-06 NOTE — ED Notes (Signed)
Pt departed in NAD, refused use of wheelchair.  

## 2016-09-06 NOTE — ED Notes (Signed)
Patient ambulated to the restroom independently  

## 2016-09-06 NOTE — ED Provider Notes (Signed)
[facility]-EMERGENCY DEPT Provider Note   CSN: [id number] Arrival date & time: 09/05/16  2312  By signing my name below, I, Rosario Adie, attest that this documentation has been prepared under the direction and in the presence of [name], Mayer Masker, MD. Electronically Signed: Rosario Adie, ED Scribe. 09/06/16. 1:57 AM.  History   Chief Complaint Chief Complaint  Patient presents with  . Hematemesis   The history is provided by the patient and medical records. No language interpreter was used.    HPI Comments: Lisa Reese is a G64P0 26 y.o. female who is ~[redacted] weeks pregnant, who presents to the Emergency Department complaining of intermittent episodes of nausea, vomiting, and post-tussive emesis beginning several months ago. She also notes associated hematemesis descried as small spots of blood over the past several months as well. She has been taking antiemetics at home without relief of her nausea/vomiting at home. Per prior chart review, pt did have recently have an [place] while in the ED on 05/13 (9 days ago) which confirmed intrauterine pregnancy. She denies fever, or any other associated symptoms.   Past Medical History:  Diagnosis Date  . Medical history non-contributory    There are no active problems to display for this patient.  Past Surgical History:  Procedure Laterality Date  . NO PAST SURGERIES     OB History    Gravida Para Term Preterm AB Living   1             SAB TAB Ectopic Multiple Live Births                 Home Medications    Prior to Admission medications   Medication Sig Start Date End Date Taking? Authorizing Provider  doxylamine, Sleep, (UNISOM) 25 MG tablet Take 1 tablet (25 mg total) by mouth every 8 (eight) hours as needed (nausea). 08/28/16  Yes Azalia Bilis, MD  metoCLOPramide (REGLAN) 10 MG tablet Take 1 tablet (10 mg total) by mouth every 6 (six) hours as needed for nausea or vomiting. 09/06/16   [name], Mayer Masker, MD  promethazine  (PHENERGAN) 25 MG tablet Take 0.5-1 tablets (12.5-25 mg total) by mouth every 6 (six) hours as needed for nausea. Patient not taking: Reported on 09/06/2016 08/07/16   Hurshel Party, CNM  ranitidine (ZANTAC) 150 MG tablet Take 1 tablet (150 mg total) by mouth 2 (two) times daily. Patient not taking: Reported on 09/06/2016 08/07/16   Hurshel Party, CNM   Family History No family history on file.  Social History Social History  Substance Use Topics  . Smoking status: Never Smoker  . Smokeless tobacco: Never Used  . Alcohol use No   Allergies   Patient has no known allergies.  Review of Systems Review of Systems  Constitutional: Negative for fever.  Respiratory: Positive for cough.   Cardiovascular: Negative for chest pain.  Gastrointestinal: Positive for abdominal pain, nausea and vomiting.  All other systems reviewed and are negative.  Physical Exam Updated Vital Signs BP (!) 118/53 (BP Location: Left Arm)   Pulse 80   Temp 98.3 F (36.8 C) (Oral)   Resp 16   Ht 5\' 2"  (1.575 m)   Wt 60.6 kg (133 lb 8 oz)   LMP 06/22/2016   SpO2 100%   BMI 24.42 kg/m   Physical Exam  Constitutional: She is oriented to person, place, and time. She appears well-developed and well-nourished. No distress.  HENT:  Head: Normocephalic and atraumatic.  Cardiovascular: Normal  rate, regular rhythm and normal heart sounds.   No murmur heard. Pulmonary/Chest: Effort normal and breath sounds normal. No respiratory distress. She has no wheezes.  Abdominal: Soft. There is no tenderness. There is no rebound and no guarding.  Genitourinary:  Genitourinary Comments: Deferred  Neurological: She is alert and oriented to person, place, and time.  Skin: Skin is warm and dry.  Psychiatric: She has a normal mood and affect.  Nursing note and vitals reviewed.  ED Treatments / Results  DIAGNOSTIC STUDIES: Oxygen Saturation is 100% on RA, normal by my interpretation.   COORDINATION OF  CARE: 1:57 AM-Discussed next steps with pt. Pt verbalized understanding and is agreeable with the plan.   Labs (all labs ordered are listed, but only abnormal results are displayed) Labs Reviewed  COMPREHENSIVE METABOLIC PANEL - Abnormal; Notable for the following:       Result Value   Sodium 133 (*)    CO2 21 (*)    ALT 7 (*)    Alkaline Phosphatase 32 (*)    Total Bilirubin 1.3 (*)    All other components within normal limits  CBC - Abnormal; Notable for the following:    RBC 3.74 (*)    Hemoglobin 11.2 (*)    HCT 33.3 (*)    All other components within normal limits  URINALYSIS, ROUTINE W REFLEX MICROSCOPIC - Abnormal; Notable for the following:    APPearance CLOUDY (*)    pH 9.0 (*)    All other components within normal limits  POC URINE PREG, ED - Abnormal; Notable for the following:    Preg Test, Ur POSITIVE (*)    All other components within normal limits  I-STAT BETA HCG BLOOD, ED ([facility], [facility], [facility] ONLY)   EKG  EKG Interpretation None      Radiology Dg Chest 2 View  Result Date: 09/06/2016 CLINICAL DATA:  Initial evaluation for acute cough, emesis. EXAM: CHEST  2 VIEW COMPARISON:  Prior radiograph from 01/02/2014. FINDINGS: The cardiac and mediastinal silhouettes are stable in size and contour, and remain within normal limits. The lungs are normally inflated. No airspace consolidation, pleural effusion, or pulmonary edema is identified. There is no pneumothorax. No acute osseous abnormality identified. IMPRESSION: No radiographic evidence for active cardiopulmonary disease. Electronically Signed   By: Rise Mu M.D.   On: 09/06/2016 03:02    Procedures Procedures   Medications Ordered in ED Medications  thiamine (B-1) injection 100 mg (100 mg Intravenous Given 09/06/16 0213)  sodium chloride 0.9 % bolus 1,000 mL (0 mLs Intravenous Stopped 09/06/16 0507)  metoCLOPramide (REGLAN) injection 5 mg (5 mg Intravenous Given 09/06/16 0212)  diphenhydrAMINE (BENADRYL)  injection 12.5 mg (12.5 mg Intravenous Given 09/06/16 0213)    Initial Impression / Assessment and Plan / ED Course  I have reviewed the triage vital signs and the nursing notes.  Pertinent labs & imaging results that were available during my care of the patient were reviewed by me and considered in my medical decision making (see chart for details).  Clinical Course as of Sep 06 737  Tue Sep 06, 2016  0404 Improved nausea.  Will po challenge.  [CH]    Clinical Course User Index [CH] [name], Mayer Masker, MD    She presents with vomiting, coughing, and occasional vomiting blood. She states she had had difficulty with this during this pregnancy. No abdominal pain or vaginal bleeding. She has had an ultrasound that showed an intrauterine pregnancy. She was prescribed Unisom and it  is not working. Patient was given fluids and Reglan. She is also given IV thiamine. Workup is largely reassuring. On recheck, she feels much better. Will discharge home with by mouth Reglan. Follow-up closely with OB/GYN.  After history, exam, and medical workup I feel the patient has been appropriately medically screened and is safe for discharge home. Pertinent diagnoses were discussed with the patient. Patient was given return precautions.   Final Clinical Impressions(s) / ED Diagnoses   Final diagnoses:  Morning sickness  Non-intractable vomiting with nausea, unspecified vomiting type   New Prescriptions Discharge Medication List as of 09/06/2016  6:11 AM    START taking these medications   Details  metoCLOPramide (REGLAN) 10 MG tablet Take 1 tablet (10 mg total) by mouth every 6 (six) hours as needed for nausea or vomiting., Starting Tue 09/06/2016, Print       I personally performed the services described in this documentation, which was scribed in my presence. The recorded information has been reviewed and is accurate.     Shon BatonHorton, Courtney F, MD 09/06/16 [phone]

## 2016-09-06 NOTE — Discharge Instructions (Signed)
If you develop worsening nausea or vomiting, inability to tolerate any fluids, or a lot of blood in your vomit, you need to be reevaluated immediately.

## 2016-09-06 NOTE — ED Notes (Signed)
ED physician at bedside.

## 2016-09-06 NOTE — ED Notes (Signed)
Patient taken to XRAY

## 2016-09-06 NOTE — ED Notes (Signed)
Pt to bathroom

## 2016-09-13 ENCOUNTER — Encounter: Payer: Self-pay | Admitting: Family Medicine

## 2016-09-13 ENCOUNTER — Ambulatory Visit (INDEPENDENT_AMBULATORY_CARE_PROVIDER_SITE_OTHER): Payer: Medicaid Other | Admitting: Family Medicine

## 2016-09-13 ENCOUNTER — Ambulatory Visit: Payer: Self-pay

## 2016-09-13 VITALS — BP 117/48 | HR 70 | Wt 121.5 lb

## 2016-09-13 DIAGNOSIS — O3680X0 Pregnancy with inconclusive fetal viability, not applicable or unspecified: Secondary | ICD-10-CM

## 2016-09-13 DIAGNOSIS — Z3400 Encounter for supervision of normal first pregnancy, unspecified trimester: Secondary | ICD-10-CM

## 2016-09-13 DIAGNOSIS — Z124 Encounter for screening for malignant neoplasm of cervix: Secondary | ICD-10-CM

## 2016-09-13 DIAGNOSIS — Z3401 Encounter for supervision of normal first pregnancy, first trimester: Secondary | ICD-10-CM | POA: Diagnosis present

## 2016-09-13 NOTE — Progress Notes (Signed)
New OB Note  09/15/2016   CC:  Chief Complaint  Patient presents with  . New Patient (Initial Visit)    Transfer of Care Patient: no  History of Present Illness: Lisa Reese is a 26 y.o. G1P0 at [redacted]w[redacted]d by LMP Patient's last menstrual period was 06/22/2016 (exact date)., early ultrasound 9 weeks, consistent by 1 day, being seen today for her first obstetrical visit. Her obstetrical history is significant for severe nausea/vomiting.. Patient does intend to breast feed. Patient unsure plans for contraception after completion of pregnancy. Pregnancy history fully reviewed.  Her periods were: regular periods every 28 days She was using no method, planned pregnancy when she conceived.  She has Positive signs or symptoms of nausea/vomiting of pregnancy. Severe nausea/vomiting, stopped using Reglan/Phenergan/Diclegis, she's only tried the individual medications alone She has Negative signs or symptoms of miscarriage or preterm labor She identifies Negative Zika risk factors for her and her partner.  Patient reports nausea and vomiting.  Any prior children are healthy, doing well, without any problems or issues: not applicable Complications in prior pregnancies: not applicable   Complications in prior deliveries: not applicable     ROS: A 12-point review of systems was performed and negative, except as stated in the above HPI.  HISTORY:  OBGYN History: As per HPI. OB History  Gravida Para Term Preterm AB Living  1            SAB TAB Ectopic Multiple Live Births               # Outcome Date GA Lbr Len/2nd Weight Sex Delivery Anes PTL Lv  1 Current               Past Medical History: Past Medical History:  Diagnosis Date  . Medical history non-contributory     Past Surgical History: Past Surgical History:  Procedure Laterality Date  . NO PAST SURGERIES      Family History:  History reviewed. No pertinent family history.  She denies any female cancers,  bleeding or blood clotting disorders.  She denies any history of mental retardation, birth defects or genetic disorders in her or the FOB's history  Social History:  Social History   Social History  . Marital status: Married    Spouse name: N/A  . Number of children: N/A  . Years of education: N/A   Occupational History  . Not on file.   Social History Main Topics  . Smoking status: Never Smoker  . Smokeless tobacco: Never Used  . Alcohol use No  . Drug use: No  . Sexual activity: Yes    Birth control/ protection: None   Other Topics Concern  . Not on file   Social History Narrative  . No narrative on file   Any pets in the household: no   Allergy: No Known Allergies  Health Maintenance:  Mammogram Up to Date: not applicable Pap Smear Up to date: no.  Last pap smear TODAY. Abnormal: not applicable History of STIs: No   Current Outpatient Medications:    Current Outpatient Prescriptions:  .  doxylamine, Sleep, (UNISOM) 25 MG tablet, Take 1 tablet (25 mg total) by mouth every 8 (eight) hours as needed (nausea). (Patient not taking: Reported on 09/13/2016), Disp: 15 tablet, Rfl: 0 .  metoCLOPramide (REGLAN) 10 MG tablet, Take 1 tablet (10 mg total) by mouth every 6 (six) hours as needed for nausea or vomiting. (Patient not taking: Reported on 09/13/2016), Disp:  30 tablet, Rfl: 0 .  promethazine (PHENERGAN) 25 MG tablet, Take 0.5-1 tablets (12.5-25 mg total) by mouth every 6 (six) hours as needed for nausea. (Patient not taking: Reported on 09/06/2016), Disp: 30 tablet, Rfl: 2 .  ranitidine (ZANTAC) 150 MG tablet, Take 1 tablet (150 mg total) by mouth 2 (two) times daily. (Patient not taking: Reported on 09/06/2016), Disp: 60 tablet, Rfl: 3  Physical Exam:   BP (!) 117/48   Pulse 70   Wt 121 lb 8 oz (55.1 kg)   LMP 06/22/2016 (Exact Date)   BMI 22.22 kg/m  Body mass index is 22.22 kg/m. Fundal height: not applicable FHTs: Unable to get today. US performed, CRL  consistent with LMP EDC.  Sending for NT/FT screen  Vitals:   09/13/16 1510  BP: (!) 117/48  Pulse: 70  Weight: 121 lb 8 oz (55.1 kg)      Uterus:     Pelvic Exam: Perineum: no hemorrhoids, normal perineum   Vulva: normal external genitalia, no lesions   Vagina:  normal mucosa, normal discharge with no blood in the vault   Cervix: no lesions and normal, pap smear done.    Adnexa: normal adnexa and no mass, fullness, tenderness;  normal adnexa   Uterus: nonenlarged   Bony Pelvis: average  System: General: well-developed, well-nourished female in no acute distress   Breast:  normal appearance, no masses or tenderness   Skin: normal coloration and turgor, no rashes   Neurologic/Psych: Alert, oriented, normal mood and affect, no gross deficits   Extremities: normal strength, tone, and muscle mass, ROM of all joints is normal   HEENT PERRLA, extraocular movement intact and sclera clear, anicteric   Mouth/Teeth mucous membranes moist, pharynx normal without lesions and dental hygiene good   Neck Supple, normal appearance, and no thyromegaly    Cardiovascular: S1, S2 normal, no murmur, rub or gallop, regular rate and rhythm   Respiratory:  Clear to auscultation bilateral. Normal respiratory effort   Abdomen: soft, non-tender; bowel sounds normal; no masses,  no organomegaly     Assessment/Plan: G1P0 5166w6d 1. Supervision of normal first pregnancy, antepartum - Difficult to auscultate FHT with Doppler (although could hear movement of baby), sent to US for confirmation of viability, was seen and CRL appropriate for GA.  - Desires genetic screening, set up appt for FT/NT, MSAFP later - encouraged to take medications for nausea around the clock until able to tolerate PO intake.  - Prenatal Profile I - HIV antibody - Culture, OB Urine - Hemoglobinopathy Evaluation - Cytology - PAP - US MFM Fetal Nuchal Translucency; Future  2. Encounter to determine fetal viability of pregnancy,  single or unspecified fetus - US OB Limited    Initial labs drawn. Continue prenatal vitamins. Genetic Screening discussed, First trimester screen and Quad screen: requested. Ultrasound discussed; fetal anatomic survey: ordered. Problem list reviewed and updated. The nature [facility] with multiple MDs and other Advanced Practice Providers was explained to patient; also emphasized that residents, students are part of our team. Routine obstetric precautions reviewed. Return in about 4 weeks (around 10/11/2016) for Routine OB visit.  >50% of 30 min visit spent on counseling and coordination of care.     Cleda ClarksElizabeth W. Vanesa Renier, DO OB Fellow [facility] Practice)

## 2016-09-13 NOTE — Progress Notes (Signed)
Pt informed that the ultrasound is considered a limited OB ultrasound and is not intended to be a complete ultrasound exam.  Patient also informed that the ultrasound is not being completed with the intent of assessing for fetal or placental anomalies or any pelvic abnormalities.  Explained that the purpose of today's ultrasound is to assess for viability.  Patient acknowledges the purpose of the exam and the limitations of the study.    Single IUP CRL - 5.31 cm (12w 0d) FHR - 158 bpm per PW doppler FM present Dr. Omer JackMumaw notified

## 2016-09-13 NOTE — Progress Notes (Signed)
Need something for nausea

## 2016-09-13 NOTE — Progress Notes (Signed)
Need PAP and Flu

## 2016-09-14 LAB — HIV ANTIBODY (ROUTINE TESTING W REFLEX): HIV SCREEN 4TH GENERATION: NONREACTIVE

## 2016-09-14 LAB — PRENATAL PROFILE I(LABCORP)
Antibody Screen: NEGATIVE
Basophils Absolute: 0 10*3/uL (ref 0.0–0.2)
Basos: 0 %
EOS (ABSOLUTE): 0 10*3/uL (ref 0.0–0.4)
Eos: 0 %
HEMOGLOBIN: 11.8 g/dL (ref 11.1–15.9)
HEP B S AG: NEGATIVE
Hematocrit: 35 % (ref 34.0–46.6)
IMMATURE GRANS (ABS): 0 10*3/uL (ref 0.0–0.1)
IMMATURE GRANULOCYTES: 0 %
LYMPHS: 20 %
Lymphocytes Absolute: 1.2 10*3/uL (ref 0.7–3.1)
MCH: 30.8 pg (ref 26.6–33.0)
MCHC: 33.7 g/dL (ref 31.5–35.7)
MCV: 91 fL (ref 79–97)
MONOS ABS: 0.3 10*3/uL (ref 0.1–0.9)
Monocytes: 4 %
NEUTROS PCT: 76 %
Neutrophils Absolute: 4.5 10*3/uL (ref 1.4–7.0)
Platelets: 373 10*3/uL (ref 150–379)
RBC: 3.83 x10E6/uL (ref 3.77–5.28)
RDW: 14.1 % (ref 12.3–15.4)
RH TYPE: POSITIVE
RPR: NONREACTIVE
Rubella Antibodies, IGG: 9.08 index (ref >0.99)
WBC: 6.1 10*3/uL (ref 3.4–10.8)

## 2016-09-14 LAB — HEMOGLOBINOPATHY EVALUATION
FERRITIN: 87 ng/mL (ref 15–150)
HGB A2 QUANT: 2.6 % (ref 1.8–3.2)
HGB C: 0 %
HGB VARIANT: 0 %
Hgb A: 96.6 % (ref 96.4–98.8)
Hgb F Quant: 0.8 % (ref 0.0–2.0)
Hgb S: 0 %
Hgb Solubility: NEGATIVE

## 2016-09-15 LAB — CYTOLOGY - PAP

## 2016-09-15 NOTE — Patient Instructions (Signed)
First Trimester of Pregnancy The first trimester of pregnancy is from week 1 until the end of week 13 (months 1 through 3). During this time, your baby will begin to develop inside you. At 6-8 weeks, the eyes and face are formed, and the heartbeat can be seen on ultrasound. At the end of 12 weeks, all the baby's organs are formed. Prenatal care is all the medical care you receive before the birth of your baby. Make sure you get good prenatal care and follow all of your doctor's instructions. Follow these instructions at home: Medicines  Take over-the-counter and prescription medicines only as told by your doctor. Some medicines are safe and some medicines are not safe during pregnancy.  Take a prenatal vitamin that contains at least 600 micrograms (mcg) of folic acid.  If you have trouble pooping (constipation), take medicine that will make your stool soft (stool softener) if your doctor approves. Eating and drinking  Eat regular, healthy meals.  Your doctor will tell you the amount of weight gain that is right for you.  Avoid raw meat and uncooked cheese.  If you feel sick to your stomach (nauseous) or throw up (vomit): ? Eat 4 or 5 small meals a day instead of 3 large meals. ? Try eating a few soda crackers. ? Drink liquids between meals instead of during meals.  To prevent constipation: ? Eat foods that are high in fiber, like fresh fruits and vegetables, whole grains, and beans. ? Drink enough fluids to keep your pee (urine) clear or pale yellow. Activity  Exercise only as told by your doctor. Stop exercising if you have cramps or pain in your lower belly (abdomen) or low back.  Do not exercise if it is too hot, too humid, or if you are in a place of great height (high altitude).  Try to avoid standing for long periods of time. Move your legs often if you must stand in one place for a long time.  Avoid heavy lifting.  Wear low-heeled shoes. Sit and stand up straight.  You  can have sex unless your doctor tells you not to. Relieving pain and discomfort  Wear a good support bra if your breasts are sore.  Take warm water baths (sitz baths) to soothe pain or discomfort caused by hemorrhoids. Use hemorrhoid cream if your doctor says it is okay.  Rest with your legs raised if you have leg cramps or low back pain.  If you have puffy, bulging veins (varicose veins) in your legs: ? Wear support hose or compression stockings as told by your doctor. ? Raise (elevate) your feet for 15 minutes, 3-4 times a day. ? Limit salt in your food. Prenatal care  Schedule your prenatal visits by the twelfth week of pregnancy.  Write down your questions. Take them to your prenatal visits.  Keep all your prenatal visits as told by your doctor. This is important. Safety  Wear your seat belt at all times when driving.  Make a list of emergency phone numbers. The list should include numbers for family, friends, the hospital, and police and fire departments. General instructions  Ask your doctor for a referral to a local prenatal class. Begin classes no later than at the start of month 6 of your pregnancy.  Ask for help if you need counseling or if you need help with nutrition. Your doctor can give you advice or tell you where to go for help.  Do not use hot tubs, steam rooms, or   saunas.  Do not douche or use tampons or scented sanitary pads.  Do not cross your legs for long periods of time.  Avoid all herbs and alcohol. Avoid drugs that are not approved by your doctor.  Do not use any tobacco products, including cigarettes, chewing tobacco, and electronic cigarettes. If you need help quitting, ask your doctor. You may get counseling or other support to help you quit.  Avoid cat litter boxes and soil used by cats. These carry germs that can cause birth defects in the baby and can cause a loss of your baby (miscarriage) or stillbirth.  Visit your dentist. At home, brush  your teeth with a soft toothbrush. Be gentle when you floss. Contact a doctor if:  You are dizzy.  You have mild cramps or pressure in your lower belly.  You have a nagging pain in your belly area.  You continue to feel sick to your stomach, you throw up, or you have watery poop (diarrhea).  You have a bad smelling fluid coming from your vagina.  You have pain when you pee (urinate).  You have increased puffiness (swelling) in your face, hands, legs, or ankles. Get help right away if:  You have a fever.  You are leaking fluid from your vagina.  You have spotting or bleeding from your vagina.  You have very bad belly cramping or pain.  You gain or lose weight rapidly.  You throw up blood. It may look like coffee grounds.  You are around people who have [place] measles, fifth disease, or chickenpox.  You have a very bad headache.  You have shortness of breath.  You have any kind of trauma, such as from a fall or a car accident. Summary  The first trimester of pregnancy is from week 1 until the end of week 13 (months 1 through 3).  To take care of yourself and your unborn baby, you will need to eat healthy meals, take medicines only if your doctor tells you to do so, and do activities that are safe for you and your baby.  Keep all follow-up visits as told by your doctor. This is important as your doctor will have to ensure that your baby is healthy and growing well. This information is not intended to replace advice given to you by your health care provider. Make sure you discuss any questions you have with your health care provider. Document Released: 09/21/2007 Document Revised: 04/12/2016 Document Reviewed: 04/12/2016 Elsevier Interactive Patient Education  2017 [employer]  SAFE MEDICATIONS IN PREGNANCY  Acne:  Benzoyl Peroxide  Salicylic Acid   Backache/Headache:  Tylenol: 2 regular strength every 4 hours OR        2 Extra strength every 6 hours    Colds/Coughs/Allergies:  Benadryl (alcohol free) 25 mg every 6 hours as needed  Breath right strips  Claritin  Cepacol throat lozenges  Chloraseptic throat spray  Cold-Eeze- up to three times per day  Cough drops, alcohol free  Flonase (by prescription only)  Guaifenesin  Mucinex  Robitussin DM (plain only, alcohol free)  Saline nasal spray/drops  Sudafed (pseudoephedrine) & Actifed * use only after [redacted] weeks gestation and if you do not have high blood pressure  Tylenol  Vicks Vaporub  Zinc lozenges  Zyrtec   Constipation:  Colace  Ducolax suppositories  Fleet enema  Glycerin suppositories  Metamucil  Milk of magnesia  Miralax  Senokot  Smooth move tea   Diarrhea:  Kaopectate  Imodium A-D   *  NO pepto Bismol   Hemorrhoids:  Anusol  Anusol HC  Preparation H  Tucks   Indigestion:  Tums  Maalox  Mylanta  Zantac  Pepcid   Insomnia:  Benadryl (alcohol free) 25mg  every 6 hours as needed  Tylenol PM  Unisom, no Gelcaps   Leg Cramps:  Tums  MagGel   Nausea/Vomiting:  Bonine  Dramamine  Emetrol  Ginger extract  Sea bands  Meclizine  Nausea medication to take during pregnancy:  Unisom (doxylamine succinate 25 mg tablets) Take one tablet daily at bedtime. If symptoms are not adequately controlled, the dose can be increased to a maximum recommended dose of two tablets daily (1/2 tablet in the morning, 1/2 tablet mid-afternoon and one at bedtime).  Vitamin B6 100mg  tablets. Take one tablet twice a day (up to 200 mg per day).   Skin Rashes:  Aveeno products  Benadryl cream or 25mg  every 6 hours as needed  Calamine Lotion  1% cortisone cream   Yeast infection:  Gyne-lotrimin 7  Monistat 7    **If taking multiple medications, please check labels to avoid duplicating the same active ingredients  **take medication as directed on the label  ** Do not exceed 4000 mg of tylenol in 24 hours  **Do not take medications that contain aspirin or ibuprofen

## 2016-09-16 LAB — URINE CULTURE, OB REFLEX

## 2016-09-16 LAB — CULTURE, OB URINE

## 2016-09-20 ENCOUNTER — Encounter (HOSPITAL_COMMUNITY): Payer: Self-pay

## 2016-09-20 ENCOUNTER — Ambulatory Visit (HOSPITAL_COMMUNITY)
Admission: RE | Admit: 2016-09-20 | Discharge: 2016-09-20 | Disposition: A | Discharge status: Home / Self Care | Payer: Medicaid Other | Source: Ambulatory Visit | Attending: Family Medicine | Admitting: Family Medicine

## 2016-09-20 DIAGNOSIS — Z3401 Encounter for supervision of normal first pregnancy, first trimester: Secondary | ICD-10-CM | POA: Diagnosis not present

## 2016-09-20 DIAGNOSIS — Z3682 Encounter for antenatal screening for nuchal translucency: Secondary | ICD-10-CM | POA: Diagnosis not present

## 2016-09-20 DIAGNOSIS — Z3A12 12 weeks gestation of pregnancy: Secondary | ICD-10-CM | POA: Insufficient documentation

## 2016-09-20 DIAGNOSIS — Z3400 Encounter for supervision of normal first pregnancy, unspecified trimester: Secondary | ICD-10-CM | POA: Diagnosis present

## 2016-09-21 ENCOUNTER — Telehealth: Payer: Self-pay | Admitting: General Practice

## 2016-09-21 ENCOUNTER — Encounter: Payer: Self-pay | Admitting: General Practice

## 2016-09-21 NOTE — Telephone Encounter (Signed)
Per Dr Omer JackMumaw, Please call patient regarding LSIL result from pap smear. She will need a repeat pap in 1 year. Called patient, no answer- left message to call us back concerning non urgent results. Will send letter

## 2016-09-23 ENCOUNTER — Other Ambulatory Visit: Payer: Self-pay

## 2016-09-26 ENCOUNTER — Emergency Department (HOSPITAL_COMMUNITY)
Admission: EM | Admit: 2016-09-26 | Discharge: 2016-09-27 | Disposition: A | Discharge status: Home / Self Care | Payer: Medicaid Other | Attending: Emergency Medicine | Admitting: Emergency Medicine

## 2016-09-26 ENCOUNTER — Encounter (HOSPITAL_COMMUNITY): Payer: Self-pay | Admitting: *Deleted

## 2016-09-26 DIAGNOSIS — Z3A13 13 weeks gestation of pregnancy: Secondary | ICD-10-CM | POA: Diagnosis not present

## 2016-09-26 DIAGNOSIS — K920 Hematemesis: Secondary | ICD-10-CM

## 2016-09-26 DIAGNOSIS — O219 Vomiting of pregnancy, unspecified: Secondary | ICD-10-CM | POA: Diagnosis not present

## 2016-09-26 HISTORY — DX: Mild hyperemesis gravidarum: O21.0

## 2016-09-26 LAB — CBC WITH DIFFERENTIAL/PLATELET
Basophils Absolute: 0 10*3/uL (ref 0.0–0.1)
Basophils Relative: 0 %
Eosinophils Absolute: 0 10*3/uL (ref 0.0–0.7)
Eosinophils Relative: 0 %
HCT: 35.6 % — ABNORMAL LOW (ref 36.0–46.0)
Hemoglobin: 12 g/dL (ref 12.0–15.0)
LYMPHS ABS: 1.2 10*3/uL (ref 0.7–4.0)
Lymphocytes Relative: 15 %
MCH: 30.1 pg (ref 26.0–34.0)
MCHC: 33.7 g/dL (ref 30.0–36.0)
MCV: 89.2 fL (ref 78.0–100.0)
Monocytes Absolute: 0.5 10*3/uL (ref 0.1–1.0)
Monocytes Relative: 6 %
NEUTROS PCT: 79 %
Neutro Abs: 6.1 10*3/uL (ref 1.7–7.7)
Platelets: 369 10*3/uL (ref 150–400)
RBC: 3.99 MIL/uL (ref 3.87–5.11)
RDW: 13.1 % (ref 11.5–15.5)
WBC: 7.8 10*3/uL (ref 4.0–10.5)

## 2016-09-26 LAB — URINALYSIS, ROUTINE W REFLEX MICROSCOPIC
Bilirubin Urine: NEGATIVE
Glucose, UA: NEGATIVE mg/dL
HGB URINE DIPSTICK: NEGATIVE
KETONES UR: 80 mg/dL — AB
Leukocytes, UA: NEGATIVE
NITRITE: NEGATIVE
PROTEIN: 100 mg/dL — AB
Specific Gravity, Urine: 1.028 (ref 1.005–1.030)
pH: 7 (ref 5.0–8.0)

## 2016-09-26 LAB — COMPREHENSIVE METABOLIC PANEL
ALK PHOS: 39 U/L (ref 38–126)
ALT: 16 U/L (ref 14–54)
AST: 26 U/L (ref 15–41)
Albumin: 4.3 g/dL (ref 3.5–5.0)
Anion gap: 12 (ref 5–15)
BUN: 7 mg/dL (ref 6–20)
CO2: 21 mmol/L — ABNORMAL LOW (ref 22–32)
CREATININE: 0.73 mg/dL (ref 0.44–1.00)
Calcium: 10.1 mg/dL (ref 8.9–10.3)
Chloride: 104 mmol/L (ref 101–111)
GFR calc Af Amer: >60 mL/min (ref >60)
Glucose, Bld: 90 mg/dL (ref 65–99)
Potassium: 3.7 mmol/L (ref 3.5–5.1)
Sodium: 137 mmol/L (ref 135–145)
Total Bilirubin: 0.6 mg/dL (ref 0.3–1.2)
Total Protein: 8.4 g/dL — ABNORMAL HIGH (ref 6.5–8.1)

## 2016-09-26 MED ORDER — SODIUM CHLORIDE 0.9 % IV BOLUS (SEPSIS)
2000.0000 mL | Freq: Once | INTRAVENOUS | Status: AC
  Administered 2016-09-27: 2000 mL via INTRAVENOUS

## 2016-09-26 MED ORDER — THIAMINE HCL 100 MG/ML IJ SOLN
100.0000 mg | Freq: Every day | INTRAMUSCULAR | Status: DC
  Administered 2016-09-27: 100 mg via INTRAVENOUS
  Filled 2016-09-26: qty 2

## 2016-09-26 MED ORDER — PANTOPRAZOLE SODIUM 40 MG IV SOLR
40.0000 mg | Freq: Once | INTRAVENOUS | Status: AC
  Administered 2016-09-27: 40 mg via INTRAVENOUS
  Filled 2016-09-26: qty 40

## 2016-09-26 MED ORDER — PROMETHAZINE HCL 25 MG/ML IJ SOLN
25.0000 mg | Freq: Once | INTRAMUSCULAR | Status: AC
  Administered 2016-09-27: 25 mg via INTRAVENOUS
  Filled 2016-09-26: qty 1

## 2016-09-26 NOTE — ED Notes (Signed)
Pt family was instructed not to give pt anything to eat or drink. Pt says she needs something on her stomach. Explained to pt that it will make her more sick. Family bought pt crackers and a drink

## 2016-09-26 NOTE — ED Triage Notes (Signed)
Pt is 13weeks and 4 days pregnant and is having constant nausea and vomting.  Pt has been seen for this and given meds.  No relief.  Pt is constantly nauseated and has been vomiting "constantly".  Pt appears to be feeling very unwell and while in triage she vomits what appears to be blood (dark) x3 while in triage.

## 2016-09-26 NOTE — ED Provider Notes (Signed)
[facility]-EMERGENCY DEPT Provider Note   CSN: [id number] Arrival date & time: 09/26/16  2211  By signing my name below, I, Lisa Reese, attest that this documentation has been prepared under the direction and in the presence of Lisa Reese, Lisa Maskerourtney F, MD. Electronically Signed: Modena JanskyAlbert Reese, Scribe. 09/26/2016. 11:24 PM.  History   Chief Complaint Chief Complaint  Patient presents with  . Hyperemesis Gravidarum   The history is provided by the patient. No language interpreter was used.   HPI Comments: Lisa Reese is a 26 y.o. female with a PMHx of hyperemesis gravidarum who presents to the Emergency Department complaining of intermittent vomiting that started this morning. She is currently G1P0 @ 6141w5d. She was seen in the ED on 09/06/16 for the similar complaint and discharged with Reglan. She saw her OB/GYN about a 6 days ago and given Reglan, Unisom, and Phenergan with relief until today. She has had several episodes of vomiting today with emesis containing dark blood. She took her medications today without relief. She reports associated nausea, abdominal pain (lower region, undescribed quality), and chest pain (worsened by vomiting). She has had prior hx of hematemesis while pregnant. Denies any blood in stool, melena, vaginal bleeding/discharge, or other complaints at this time.  Patient was seen and evaluated by me on May 22. At that time showed a negative chest x-ray and no recurrent emesis. Past Medical History:  Diagnosis Date  . Hyperemesis gravidarum   . Medical history non-contributory     Patient Active Problem List   Diagnosis Date Noted  . Supervision of normal first pregnancy, antepartum 09/13/2016    Past Surgical History:  Procedure Laterality Date  . NO PAST SURGERIES      OB History    Gravida Para Term Preterm AB Living   1         0   SAB TAB Ectopic Multiple Live Births                   Home Medications    Prior to Admission medications     Medication Sig Start Date End Date Taking? Authorizing Provider  ranitidine (ZANTAC) 150 MG tablet Take 1 tablet (150 mg total) by mouth 2 (two) times daily. 08/07/16  Yes Leftwich-Kirby, Wilmer FloorLisa A, CNM  doxylamine, Sleep, (UNISOM) 25 MG tablet Take 1 tablet (25 mg total) by mouth at bedtime as needed (nausea). 09/27/16   Lisa Reese, Lisa Maskerourtney F, MD  metoCLOPramide (REGLAN) 10 MG tablet Take 1 tablet (10 mg total) by mouth every 6 (six) hours as needed for nausea. 09/27/16   Lisa Reese, Lisa Maskerourtney F, MD  omeprazole (PRILOSEC) 20 MG capsule Take 1 capsule (20 mg total) by mouth daily. 09/27/16   Lisa Reese, Lisa Maskerourtney F, MD  promethazine (PHENERGAN) 25 MG tablet Take 1 tablet (25 mg total) by mouth every 6 (six) hours as needed for nausea or vomiting. 09/27/16   Lisa Reese, Lisa Maskerourtney F, MD    Family History No family history on file.  Social History Social History  Substance Use Topics  . Smoking status: Never Smoker  . Smokeless tobacco: Never Used  . Alcohol use No     Allergies   Patient has no known allergies.   Review of Systems Review of Systems  Constitutional: Negative for fever.  Respiratory: Positive for cough. Negative for shortness of breath.   Cardiovascular: Positive for chest pain (secondary to vomiting).  Gastrointestinal: Positive for abdominal pain, nausea and vomiting. Negative for blood in stool.  Genitourinary: Negative for vaginal bleeding and  vaginal discharge.  All other systems reviewed and are negative.    Physical Exam Updated Vital Signs BP 113/63 (BP Location: Left Arm)   Pulse 78   Temp 98.7 Reese (37.1 C) (Oral)   Resp 18   Wt 122 lb 12.8 oz (55.7 kg)   LMP 06/22/2016 (Exact Date)   SpO2 99%   BMI 22.46 kg/m   Physical Exam  Constitutional: She is oriented to person, place, and time. She appears well-developed and well-nourished. No distress.  HENT:  Head: Normocephalic and atraumatic.  Mucous membranes dry  Eyes: Pupils are equal, round, and reactive to light.   Cardiovascular: Normal rate, regular rhythm and normal heart sounds.   Pulmonary/Chest: Effort normal and breath sounds normal. No respiratory distress. She has no wheezes.  Abdominal: Soft. Bowel sounds are normal. There is no tenderness.  Neurological: She is alert and oriented to person, place, and time.  Skin: Skin is warm and dry.  Psychiatric: She has a normal mood and affect.  Nursing note and vitals reviewed.    ED Treatments / Results  DIAGNOSTIC STUDIES: Oxygen Saturation is 99% on RA, normal by my interpretation.    COORDINATION OF CARE: 11:28 PM- Pt advised of plan for treatment and pt agrees.  Labs (all labs ordered are listed, but only abnormal results are displayed) Labs Reviewed  CBC WITH DIFFERENTIAL/PLATELET - Abnormal; Notable for the following:       Result Value   HCT 35.6 (*)    All other components within normal limits  COMPREHENSIVE METABOLIC PANEL - Abnormal; Notable for the following:    CO2 21 (*)    Total Protein 8.4 (*)    All other components within normal limits  URINALYSIS, ROUTINE W REFLEX MICROSCOPIC - Abnormal; Notable for the following:    APPearance HAZY (*)    Ketones, ur 80 (*)    Protein, ur 100 (*)    Bacteria, UA RARE (*)    Squamous Epithelial / LPF 0-5 (*)    All other components within normal limits  OCCULT BLOOD GASTRIC / DUODENUM (SPECIMEN CUP) - Abnormal; Notable for the following:    Occult Blood, Gastric POSITIVE (*)    All other components within normal limits    EKG  EKG Interpretation None       Radiology No results found.  Procedures Procedures (including critical care time) EMERGENCY DEPARTMENT [place] PREGNANCY "Study: Limited Ultrasound of the Pelvis for Pregnancy"  INDICATIONS:Pregnancy(required) Multiple views of the uterus and pelvic cavity were obtained in real-time with a multi-frequency probe.  APPROACH:Transabdominal  PERFORMED BY: Myself IMAGES ARCHIVED?: Yes LIMITATIONS: Emergent  procedure PREGNANCY FREE FLUID: None ADNEXAL FINDINGS: GESTATIONAL AGE, ESTIMATE: 13 weeks FETAL HEART RATE: 176 INTERPRETATION: Good fetal movement      Medications Ordered in ED Medications  thiamine (B-1) injection 100 mg (100 mg Intravenous Given 09/27/16 0129)  sodium chloride 0.9 % bolus 2,000 mL (2,000 mLs Intravenous New Bag/Given 09/27/16 0126)  pantoprazole (PROTONIX) injection 40 mg (40 mg Intravenous Given 09/27/16 0128)  promethazine (PHENERGAN) injection 25 mg (25 mg Intravenous Given 09/27/16 0127)     Initial Impression / Assessment and Plan / ED Course  I have reviewed the triage vital signs and the nursing notes.  Pertinent labs & imaging results that were available during my care of the patient were reviewed by me and considered in my medical decision making (see chart for details).     Patient presents with recurrent vomiting during pregnancy. Had been doing well  on a regimen of Reglan, Phenergan, and Unisom. She developed blood in her emesis today. She was seen and evaluated for the same several weeks ago. She is nontoxic on exam. Overall her vital signs are reassuring. She does have 80 ketones in the urine. At bedside she has an emesis basin with dark, coffee-ground colored emesis. Gastric occult is positive. Denies bloody stools. Hemoglobin is stable. Breath sounds are clear. Do not feel she needs another x-ray at this time. She was given Protonix, fluids, thiamine, and Phenergan. She has rested comfortably. No recurrent emesis. After 2 L of fluid, she is able to tolerate fluids.  Patient was given refills of her medication in addition to adding omeprazole. Suspect she may have a Mallory-Weiss tear or gastritis from recurrent vomiting. Follow-up closely with her OB. GI follow-up was also provided.  After history, exam, and medical workup I feel the patient has been appropriately medically screened and is safe for discharge home. Pertinent diagnoses were discussed with  the patient. Patient was given return precautions.   Final Clinical Impressions(s) / ED Diagnoses   Final diagnoses:  Nausea/vomiting in pregnancy  Hematemesis with nausea    New Prescriptions New Prescriptions   DOXYLAMINE, SLEEP, (UNISOM) 25 MG TABLET    Take 1 tablet (25 mg total) by mouth at bedtime as needed (nausea).   METOCLOPRAMIDE (REGLAN) 10 MG TABLET    Take 1 tablet (10 mg total) by mouth every 6 (six) hours as needed for nausea.   OMEPRAZOLE (PRILOSEC) 20 MG CAPSULE    Take 1 capsule (20 mg total) by mouth daily.   PROMETHAZINE (PHENERGAN) 25 MG TABLET    Take 1 tablet (25 mg total) by mouth every 6 (six) hours as needed for nausea or vomiting.   I personally performed the services described in this documentation, which was scribed in my presence. The recorded information has been reviewed and is accurate.     Shon Baton, MD 09/27/16 [phone]

## 2016-09-27 LAB — OCCULT BLOOD GASTRIC / DUODENUM (SPECIMEN CUP): Occult Blood, Gastric: POSITIVE — AB

## 2016-09-27 MED ORDER — PROMETHAZINE HCL 25 MG PO TABS: 25.0000 mg | ORAL_TABLET | Freq: Four times a day (QID) | ORAL | 0 refills | Status: DC | PRN

## 2016-09-27 MED ORDER — METOCLOPRAMIDE HCL 10 MG PO TABS: 10.0000 mg | ORAL_TABLET | Freq: Four times a day (QID) | ORAL | 0 refills | Status: DC | PRN

## 2016-09-27 MED ORDER — OMEPRAZOLE 20 MG PO CPDR: 20.0000 mg | DELAYED_RELEASE_CAPSULE | Freq: Every day | ORAL | 0 refills | Status: DC

## 2016-09-27 MED ORDER — DOXYLAMINE SUCCINATE (SLEEP) 25 MG PO TABS: 25.0000 mg | ORAL_TABLET | Freq: Every evening | ORAL | 0 refills | Status: DC | PRN

## 2016-09-27 NOTE — ED Notes (Signed)
Pt ambulated to restroom. 

## 2016-09-27 NOTE — Discharge Instructions (Signed)
You were seen today for nausea and vomiting during pregnancy. It was noted that he had some blood in your vomit. This may be related to a small tear your esophagus. You're otherwise well appearing. You will be started on an acid reducer. Continue to take your vomiting medications at home. If you develop worsening bloody emesis or any new or worsening symptoms she should be reevaluated.  Follow-up closely with your OB/GYN.

## 2016-09-27 NOTE — ED Notes (Signed)
Pt states she cannot drink anything, she said she needs to sleep, water at bedside

## 2016-10-12 ENCOUNTER — Ambulatory Visit (INDEPENDENT_AMBULATORY_CARE_PROVIDER_SITE_OTHER): Payer: Medicaid Other | Admitting: Student

## 2016-10-12 VITALS — BP 124/71 | HR 70 | Wt 132.0 lb

## 2016-10-12 DIAGNOSIS — R87612 Low grade squamous intraepithelial lesion on cytologic smear of cervix (LGSIL): Secondary | ICD-10-CM | POA: Diagnosis not present

## 2016-10-12 DIAGNOSIS — R309 Painful micturition, unspecified: Secondary | ICD-10-CM

## 2016-10-12 DIAGNOSIS — Z3402 Encounter for supervision of normal first pregnancy, second trimester: Secondary | ICD-10-CM

## 2016-10-12 DIAGNOSIS — Z3400 Encounter for supervision of normal first pregnancy, unspecified trimester: Secondary | ICD-10-CM

## 2016-10-12 DIAGNOSIS — Z3401 Encounter for supervision of normal first pregnancy, first trimester: Secondary | ICD-10-CM

## 2016-10-12 NOTE — Progress Notes (Signed)
   PRENATAL VISIT NOTE  Subjective:  Lisa Reese is a 26 y.o. G1P0 at 1643w0d being seen today for ongoing prenatal care.  She is currently monitored for the following issues for this low-risk pregnancy and has Supervision of normal first pregnancy, antepartum and Low grade squamous intraepith lesion on cytologic smear cervix (lgsil) on her problem list.  Patient reports no complaints.   .  .  Movement: Present. Denies leaking of fluid.   The following portions of the patient's history were reviewed and updated as appropriate: allergies, current medications, past family history, past medical history, past social history, past surgical history and problem list. Problem list updated.  Objective:   Vitals:   10/12/16 1408  BP: 124/71  Pulse: 70  Weight: 132 lb (59.9 kg)    Fetal Status: Fetal Heart Rate (bpm): 136   Movement: Present     General:  Alert, oriented and cooperative. Patient is in no acute distress.  Skin: Skin is warm and dry. No rash noted.   Cardiovascular: Normal heart rate noted  Respiratory: Normal respiratory effort, no problems with respiration noted  Abdomen: Soft, gravid, appropriate for gestational age. Pain/Pressure: Absent     Pelvic:  Cervical exam deferred        Extremities: Normal range of motion.  Edema: None  Mental Status: Normal mood and affect. Normal behavior. Normal judgment and thought content.   Assessment and Plan:  Pregnancy: G1P0 at 2943w0d  1. Encounter for supervision of normal first pregnancy in first trimester - US MFM OB COMP + 14 WK; Future  2. Pain with urination Patient complains of some slight burning with urination that started in the past two days; denies urgency, frequency or low back pain.  - Urine Culture  3. Supervision of normal first pregnancy, antepartum   4. Low grade squamous intraepith lesion on cytologic smear cervix (lgsil) Discussed results with patient; will plan for repeat pap in May 2019  Preterm labor  symptoms and general obstetric precautions including but not limited to vaginal bleeding, contractions, leaking of fluid and fetal movement were reviewed in detail with the patient. Please refer to After Visit Summary for other counseling recommendations.  Return in about 4 weeks (around 11/09/2016).   Marylene LandKathryn Lorraine Dunia Pringle, CNM

## 2016-10-14 LAB — URINE CULTURE

## 2016-10-20 ENCOUNTER — Encounter: Payer: Self-pay | Admitting: Student

## 2016-10-26 ENCOUNTER — Encounter: Payer: Self-pay | Admitting: Family Medicine

## 2016-10-26 ENCOUNTER — Other Ambulatory Visit: Payer: Self-pay | Admitting: *Deleted

## 2016-10-26 DIAGNOSIS — Z3400 Encounter for supervision of normal first pregnancy, unspecified trimester: Secondary | ICD-10-CM

## 2016-10-26 DIAGNOSIS — K2100 Gastro-esophageal reflux disease with esophagitis, without bleeding: Secondary | ICD-10-CM

## 2016-10-26 MED ORDER — OMEPRAZOLE 20 MG PO CPDR: 20.0000 mg | DELAYED_RELEASE_CAPSULE | Freq: Every day | ORAL | 6 refills | Status: DC

## 2016-10-31 ENCOUNTER — Other Ambulatory Visit: Payer: Self-pay | Admitting: General Practice

## 2016-10-31 DIAGNOSIS — O219 Vomiting of pregnancy, unspecified: Secondary | ICD-10-CM

## 2016-10-31 MED ORDER — PROMETHAZINE HCL 25 MG PO TABS: 25.0000 mg | ORAL_TABLET | Freq: Four times a day (QID) | ORAL | 0 refills | Status: DC | PRN

## 2016-11-03 ENCOUNTER — Ambulatory Visit (HOSPITAL_COMMUNITY)
Admission: RE | Admit: 2016-11-03 | Discharge: 2016-11-03 | Disposition: A | Discharge status: Home / Self Care | Payer: Medicaid Other | Source: Ambulatory Visit | Attending: Student | Admitting: Student

## 2016-11-03 ENCOUNTER — Other Ambulatory Visit: Payer: Self-pay | Admitting: Student

## 2016-11-03 DIAGNOSIS — Z3A19 19 weeks gestation of pregnancy: Secondary | ICD-10-CM

## 2016-11-03 DIAGNOSIS — Z363 Encounter for antenatal screening for malformations: Secondary | ICD-10-CM

## 2016-11-03 DIAGNOSIS — Z3401 Encounter for supervision of normal first pregnancy, first trimester: Secondary | ICD-10-CM

## 2016-11-09 ENCOUNTER — Ambulatory Visit (INDEPENDENT_AMBULATORY_CARE_PROVIDER_SITE_OTHER): Payer: Medicaid Other | Admitting: Family Medicine

## 2016-11-09 VITALS — BP 111/69 | HR 97 | Wt 140.9 lb

## 2016-11-09 DIAGNOSIS — Z3400 Encounter for supervision of normal first pregnancy, unspecified trimester: Secondary | ICD-10-CM

## 2016-11-09 DIAGNOSIS — Z3402 Encounter for supervision of normal first pregnancy, second trimester: Secondary | ICD-10-CM | POA: Diagnosis not present

## 2016-11-09 DIAGNOSIS — Z09 Encounter for follow-up examination after completed treatment for conditions other than malignant neoplasm: Secondary | ICD-10-CM

## 2016-11-09 MED ORDER — CONCEPT OB 130-92.4-1 MG PO CAPS: 1.0000 | ORAL_CAPSULE | Freq: Every day | ORAL | 11 refills | Status: DC

## 2016-11-09 NOTE — Progress Notes (Signed)
   PRENATAL VISIT NOTE  Subjective:  Lisa Reese is a 26 y.o. G1P0 at 4187w0d being seen today for ongoing prenatal care.  She is currently monitored for the following issues for this low-risk pregnancy and has Supervision of normal first pregnancy, antepartum and Low grade squamous intraepith lesion on cytologic smear cervix (lgsil) on her problem list.  Patient reports difficulty getting comfortable while sleeping.  Contractions: Not present. Vag. Bleeding: None.  Movement: Present. Denies leaking of fluid.   The following portions of the patient's history were reviewed and updated as appropriate: allergies, current medications, past family history, past medical history, past social history, past surgical history and problem list. Problem list updated.  Objective:   Vitals:   11/09/16 1256  BP: 111/69  Pulse: 97  Weight: 140 lb 14.4 oz (63.9 kg)    Fetal Status: Fetal Heart Rate (bpm): 140 Fundal Height: 20 cm Movement: Present     General:  Alert, oriented and cooperative. Patient is in no acute distress.  Skin: Skin is warm and dry. No rash noted.   Cardiovascular: Normal heart rate noted  Respiratory: Normal respiratory effort, no problems with respiration noted  Abdomen: Soft, gravid, appropriate for gestational age.  Pain/Pressure: Absent     Pelvic: Cervical exam deferred        Extremities: Normal range of motion.  Edema: None  Mental Status:  Normal mood and affect. Normal behavior. Normal judgment and thought content.   Assessment and Plan:  Pregnancy: G1P0 at 5187w0d  1. Supervision of normal first pregnancy, antepartum Needs f/u to complete anatomy PNVs ordered General comfort measures in pregnancy discussed. - US MFM OB FOLLOW UP; Future - Prenat w/o A [id number]-FA (CONCEPT OB) 130-92.4-1 MG CAPS; Take 1 capsule by mouth daily.  Dispense: 30 capsule; Refill: 11  General obstetric precautions including but not limited to vaginal bleeding, contractions,  leaking of fluid and fetal movement were reviewed in detail with the patient. Please refer to After Visit Summary for other counseling recommendations.  Return in 4 weeks (on 12/07/2016).   Reva Boresanya S Sharley Keeler, MD

## 2016-11-09 NOTE — Patient Instructions (Signed)
 Second Trimester of Pregnancy The second trimester is from week 14 through week 27 (months 4 through 6). The second trimester is often a time when you feel your best. Your body has adjusted to being pregnant, and you begin to feel better physically. Usually, morning sickness has lessened or quit completely, you may have more energy, and you may have an increase in appetite. The second trimester is also a time when the fetus is growing rapidly. At the end of the sixth month, the fetus is about 9 inches long and weighs about 1 pounds. You will likely begin to feel the baby move (quickening) between 16 and 20 weeks of pregnancy. Body changes during your second trimester Your body continues to go through many changes during your second trimester. The changes vary from woman to woman.  Your weight will continue to increase. You will notice your lower abdomen bulging out.  You may begin to get stretch marks on your hips, abdomen, and breasts.  You may develop headaches that can be relieved by medicines. The medicines should be approved by your health care provider.  You may urinate more often because the fetus is pressing on your bladder.  You may develop or continue to have heartburn as a result of your pregnancy.  You may develop constipation because certain hormones are causing the muscles that push waste through your intestines to slow down.  You may develop hemorrhoids or swollen, bulging veins (varicose veins).  You may have back pain. This is caused by: ? Weight gain. ? Pregnancy hormones that are relaxing the joints in your pelvis. ? A shift in weight and the muscles that support your balance.  Your breasts will continue to grow and they will continue to become tender.  Your gums may bleed and may be sensitive to brushing and flossing.  Dark spots or blotches (chloasma, mask of pregnancy) may develop on your face. This will likely fade after the baby is born.  A dark line from  your belly button to the pubic area (linea nigra) may appear. This will likely fade after the baby is born.  You may have changes in your hair. These can include thickening of your hair, rapid growth, and changes in texture. Some women also have hair loss during or after pregnancy, or hair that feels dry or thin. Your hair will most likely return to normal after your baby is born.  What to expect at prenatal visits During a routine prenatal visit:  You will be weighed to make sure you and the fetus are growing normally.  Your blood pressure will be taken.  Your abdomen will be measured to track your baby's growth.  The fetal heartbeat will be listened to.  Any test results from the previous visit will be discussed.  Your health care provider may ask you:  How you are feeling.  If you are feeling the baby move.  If you have had any abnormal symptoms, such as leaking fluid, bleeding, severe headaches, or abdominal cramping.  If you are using any tobacco products, including cigarettes, chewing tobacco, and electronic cigarettes.  If you have any questions.  Other tests that may be performed during your second trimester include:  Blood tests that check for: ? Low iron levels (anemia). ? High blood sugar that affects pregnant women (gestational diabetes) between 24 and 28 weeks. ? Rh antibodies. This is to check for a protein on red blood cells (Rh factor).  Urine tests to check for infections, diabetes,   or protein in the urine.  An ultrasound to confirm the proper growth and development of the baby.  An amniocentesis to check for possible genetic problems.  Fetal screens for spina bifida and Down syndrome.  HIV (human immunodeficiency virus) testing. Routine prenatal testing includes screening for HIV, unless you choose not to have this test.  Follow these instructions at home: Medicines  Follow your health care provider's instructions regarding medicine use. Specific  medicines may be either safe or unsafe to take during pregnancy.  Take a prenatal vitamin that contains at least 600 micrograms (mcg) of folic acid.  If you develop constipation, try taking a stool softener if your health care provider approves. Eating and drinking  Eat a balanced diet that includes fresh fruits and vegetables, whole grains, good sources of protein such as meat, eggs, or tofu, and low-fat dairy. Your health care provider will help you determine the amount of weight gain that is right for you.  Avoid raw meat and uncooked cheese. These carry germs that can cause birth defects in the baby.  If you have low calcium intake from food, talk to your health care provider about whether you should take a daily calcium supplement.  Limit foods that are high in fat and processed sugars, such as fried and sweet foods.  To prevent constipation: ? Drink enough fluid to keep your urine clear or pale yellow. ? Eat foods that are high in fiber, such as fresh fruits and vegetables, whole grains, and beans. Activity  Exercise only as directed by your health care provider. Most women can continue their usual exercise routine during pregnancy. Try to exercise for 30 minutes at least 5 days a week. Stop exercising if you experience uterine contractions.  Avoid heavy lifting, wear low heel shoes, and practice good posture.  A sexual relationship may be continued unless your health care provider directs you otherwise. Relieving pain and discomfort  Wear a good support bra to prevent discomfort from breast tenderness.  Take warm sitz baths to soothe any pain or discomfort caused by hemorrhoids. Use hemorrhoid cream if your health care provider approves.  Rest with your legs elevated if you have leg cramps or low back pain.  If you develop varicose veins, wear support hose. Elevate your feet for 15 minutes, 3-4 times a day. Limit salt in your diet. Prenatal Care  Write down your questions.  Take them to your prenatal visits.  Keep all your prenatal visits as told by your health care provider. This is important. Safety  Wear your seat belt at all times when driving.  Make a list of emergency phone numbers, including numbers for family, friends, the hospital, and police and fire departments. General instructions  Ask your health care provider for a referral to a local prenatal education class. Begin classes no later than the beginning of month 6 of your pregnancy.  Ask for help if you have counseling or nutritional needs during pregnancy. Your health care provider can offer advice or refer you to specialists for help with various needs.  Do not use hot tubs, steam rooms, or saunas.  Do not douche or use tampons or scented sanitary pads.  Do not cross your legs for long periods of time.  Avoid cat litter boxes and soil used by cats. These carry germs that can cause birth defects in the baby and possibly loss of the fetus by miscarriage or stillbirth.  Avoid all smoking, herbs, alcohol, and unprescribed drugs. Chemicals in these products   can affect the formation and growth of the baby.  Do not use any products that contain nicotine or tobacco, such as cigarettes and e-cigarettes. If you need help quitting, ask your health care provider.  Visit your dentist if you have not gone yet during your pregnancy. Use a soft toothbrush to brush your teeth and be gentle when you floss. Contact a health care provider if:  You have dizziness.  You have mild pelvic cramps, pelvic pressure, or nagging pain in the abdominal area.  You have persistent nausea, vomiting, or diarrhea.  You have a bad smelling vaginal discharge.  You have pain when you urinate. Get help right away if:  You have a fever.  You are leaking fluid from your vagina.  You have spotting or bleeding from your vagina.  You have severe abdominal cramping or pain.  You have rapid weight gain or weight  loss.  You have shortness of breath with chest pain.  You notice sudden or extreme swelling of your face, hands, ankles, feet, or legs.  You have not felt your baby move in over an hour.  You have severe headaches that do not go away when you take medicine.  You have vision changes. Summary  The second trimester is from week 14 through week 27 (months 4 through 6). It is also a time when the fetus is growing rapidly.  Your body goes through many changes during pregnancy. The changes vary from woman to woman.  Avoid all smoking, herbs, alcohol, and unprescribed drugs. These chemicals affect the formation and growth your baby.  Do not use any tobacco products, such as cigarettes, chewing tobacco, and e-cigarettes. If you need help quitting, ask your health care provider.  Contact your health care provider if you have any questions. Keep all prenatal visits as told by your health care provider. This is important. This information is not intended to replace advice given to you by your health care provider. Make sure you discuss any questions you have with your health care provider. Document Released: 03/29/2001 Document Revised: 09/10/2015 Document Reviewed: 06/05/2012 [employer] Interactive Patient Education  2017 [employer]   Breastfeeding Deciding to breastfeed is one of the best choices you can make for you and your baby. A change in hormones during pregnancy causes your breast tissue to grow and increases the number and size of your milk ducts. These hormones also allow proteins, sugars, and fats from your blood supply to make breast milk in your milk-producing glands. Hormones prevent breast milk from being released before your baby is born as well as prompt milk flow after birth. Once breastfeeding has begun, thoughts of your baby, as well as his or her sucking or crying, can stimulate the release of milk from your milk-producing glands. Benefits of breastfeeding For Your  Baby  Your first milk (colostrum) helps your baby's digestive system function better.  There are antibodies in your milk that help your baby fight off infections.  Your baby has a lower incidence of asthma, allergies, and sudden infant death syndrome.  The nutrients in breast milk are better for your baby than infant formulas and are designed uniquely for your baby's needs.  Breast milk improves your baby's brain development.  Your baby is less likely to develop other conditions, such as childhood obesity, asthma, or type 2 diabetes mellitus.  For You  Breastfeeding helps to create a very special bond between you and your baby.  Breastfeeding is convenient. Breast milk is always available at   the correct temperature and costs nothing.  Breastfeeding helps to burn calories and helps you lose the weight gained during pregnancy.  Breastfeeding makes your uterus contract to its prepregnancy size faster and slows bleeding (lochia) after you give birth.  Breastfeeding helps to lower your risk of developing type 2 diabetes mellitus, osteoporosis, and breast or ovarian cancer later in life.  Signs that your baby is hungry Early Signs of Hunger  Increased alertness or activity.  Stretching.  Movement of the head from side to side.  Movement of the head and opening of the mouth when the corner of the mouth or cheek is stroked (rooting).  Increased sucking sounds, smacking lips, cooing, sighing, or squeaking.  Hand-to-mouth movements.  Increased sucking of fingers or hands.  Late Signs of Hunger  Fussing.  Intermittent crying.  Extreme Signs of Hunger Signs of extreme hunger will require calming and consoling before your baby will be able to breastfeed successfully. Do not wait for the following signs of extreme hunger to occur before you initiate breastfeeding:  Restlessness.  A loud, strong cry.  Screaming.  Breastfeeding basics Breastfeeding Initiation  Find a  comfortable place to sit or lie down, with your neck and back well supported.  Place a pillow or rolled up blanket under your baby to bring him or her to the level of your breast (if you are seated). Nursing pillows are specially designed to help support your arms and your baby while you breastfeed.  Make sure that your baby's abdomen is facing your abdomen.  Gently massage your breast. With your fingertips, massage from your chest wall toward your nipple in a circular motion. This encourages milk flow. You may need to continue this action during the feeding if your milk flows slowly.  Support your breast with 4 fingers underneath and your thumb above your nipple. Make sure your fingers are well away from your nipple and your baby's mouth.  Stroke your baby's lips gently with your finger or nipple.  When your baby's mouth is open wide enough, quickly bring your baby to your breast, placing your entire nipple and as much of the colored area around your nipple (areola) as possible into your baby's mouth. ? More areola should be visible above your baby's upper lip than below the lower lip. ? Your baby's tongue should be between his or her lower gum and your breast.  Ensure that your baby's mouth is correctly positioned around your nipple (latched). Your baby's lips should create a seal on your breast and be turned out (everted).  It is common for your baby to suck about 2-3 minutes in order to start the flow of breast milk.  Latching Teaching your baby how to latch on to your breast properly is very important. An improper latch can cause nipple pain and decreased milk supply for you and poor weight gain in your baby. Also, if your baby is not latched onto your nipple properly, he or she may swallow some air during feeding. This can make your baby fussy. Burping your baby when you switch breasts during the feeding can help to get rid of the air. However, teaching your baby to latch on properly is  still the best way to prevent fussiness from swallowing air while breastfeeding. Signs that your baby has successfully latched on to your nipple:  Silent tugging or silent sucking, without causing you pain.  Swallowing heard between every 3-4 sucks.  Muscle movement above and in front of his or her   ears while sucking.  Signs that your baby has not successfully latched on to nipple:  Sucking sounds or smacking sounds from your baby while breastfeeding.  Nipple pain.  If you think your baby has not latched on correctly, slip your finger into the corner of your baby's mouth to break the suction and place it between your baby's gums. Attempt breastfeeding initiation again. Signs of Successful Breastfeeding Signs from your baby:  A gradual decrease in the number of sucks or complete cessation of sucking.  Falling asleep.  Relaxation of his or her body.  Retention of a small amount of milk in his or her mouth.  Letting go of your breast by himself or herself.  Signs from you:  Breasts that have increased in firmness, weight, and size 1-3 hours after feeding.  Breasts that are softer immediately after breastfeeding.  Increased milk volume, as well as a change in milk consistency and color by the fifth day of breastfeeding.  Nipples that are not sore, cracked, or bleeding.  Signs That Your Baby is Getting Enough Milk  Wetting at least 1-2 diapers during the first 24 hours after birth.  Wetting at least 5-6 diapers every 24 hours for the first week after birth. The urine should be clear or pale yellow by 5 days after birth.  Wetting 6-8 diapers every 24 hours as your baby continues to grow and develop.  At least 3 stools in a 24-hour period by age 5 days. The stool should be soft and yellow.  At least 3 stools in a 24-hour period by age 7 days. The stool should be seedy and yellow.  No loss of weight greater than 10% of birth weight during the first 3 days of age.  Average  weight gain of 4-7 ounces (113-198 g) per week after age 4 days.  Consistent daily weight gain by age 5 days, without weight loss after the age of 2 weeks.  After a feeding, your baby may spit up a small amount. This is common. Breastfeeding frequency and duration Frequent feeding will help you make more milk and can prevent sore nipples and breast engorgement. Breastfeed when you feel the need to reduce the fullness of your breasts or when your baby shows signs of hunger. This is called "breastfeeding on demand." Avoid introducing a pacifier to your baby while you are working to establish breastfeeding (the first 4-6 weeks after your baby is born). After this time you may choose to use a pacifier. Research has shown that pacifier use during the first year of a baby's life decreases the risk of sudden infant death syndrome (SIDS). Allow your baby to feed on each breast as long as he or she wants. Breastfeed until your baby is finished feeding. When your baby unlatches or falls asleep while feeding from the first breast, offer the second breast. Because newborns are often sleepy in the first few weeks of life, you may need to awaken your baby to get him or her to feed. Breastfeeding times will vary from baby to baby. However, the following rules can serve as a guide to help you ensure that your baby is properly fed:  Newborns (babies 4 weeks of age or younger) may breastfeed every 1-3 hours.  Newborns should not go longer than 3 hours during the day or 5 hours during the night without breastfeeding.  You should breastfeed your baby a minimum of 8 times in a 24-hour period until you begin to introduce solid foods to your   baby at around 6 months of age.  Breast milk pumping Pumping and storing breast milk allows you to ensure that your baby is exclusively fed your breast milk, even at times when you are unable to breastfeed. This is especially important if you are going back to work while you are still  breastfeeding or when you are not able to be present during feedings. Your lactation consultant can give you guidelines on how long it is safe to store breast milk. A breast pump is a machine that allows you to pump milk from your breast into a sterile bottle. The pumped breast milk can then be stored in a refrigerator or freezer. Some breast pumps are operated by hand, while others use electricity. Ask your lactation consultant which type will work best for you. Breast pumps can be purchased, but some hospitals and breastfeeding support groups lease breast pumps on a monthly basis. A lactation consultant can teach you how to hand express breast milk, if you prefer not to use a pump. Caring for your breasts while you breastfeed Nipples can become dry, cracked, and sore while breastfeeding. The following recommendations can help keep your breasts moisturized and healthy:  Avoid using soap on your nipples.  Wear a supportive bra. Although not required, special nursing bras and tank tops are designed to allow access to your breasts for breastfeeding without taking off your entire bra or top. Avoid wearing underwire-style bras or extremely tight bras.  Air dry your nipples for 3-4minutes after each feeding.  Use only cotton bra pads to absorb leaked breast milk. Leaking of breast milk between feedings is normal.  Use lanolin on your nipples after breastfeeding. Lanolin helps to maintain your skin's normal moisture barrier. If you use pure lanolin, you do not need to wash it off before feeding your baby again. Pure lanolin is not toxic to your baby. You may also hand express a few drops of breast milk and gently massage that milk into your nipples and allow the milk to air dry.  In the first few weeks after giving birth, some women experience extremely full breasts (engorgement). Engorgement can make your breasts feel heavy, warm, and tender to the touch. Engorgement peaks within 3-5 days after you give  birth. The following recommendations can help ease engorgement:  Completely empty your breasts while breastfeeding or pumping. You may want to start by applying warm, moist heat (in the shower or with warm water-soaked hand towels) just before feeding or pumping. This increases circulation and helps the milk flow. If your baby does not completely empty your breasts while breastfeeding, pump any extra milk after he or she is finished.  Wear a snug bra (nursing or regular) or tank top for 1-2 days to signal your body to slightly decrease milk production.  Apply ice packs to your breasts, unless this is too uncomfortable for you.  Make sure that your baby is latched on and positioned properly while breastfeeding.  If engorgement persists after 48 hours of following these recommendations, contact your health care provider or a lactation consultant. Overall health care recommendations while breastfeeding  Eat healthy foods. Alternate between meals and snacks, eating 3 of each per day. Because what you eat affects your breast milk, some of the foods may make your baby more irritable than usual. Avoid eating these foods if you are sure that they are negatively affecting your baby.  Drink milk, fruit juice, and water to satisfy your thirst (about 10 glasses a day).    Rest often, relax, and continue to take your prenatal vitamins to prevent fatigue, stress, and anemia.  Continue breast self-awareness checks.  Avoid chewing and smoking tobacco. Chemicals from cigarettes that pass into breast milk and exposure to secondhand smoke may harm your baby.  Avoid alcohol and drug use, including marijuana. Some medicines that may be harmful to your baby can pass through breast milk. It is important to ask your health care provider before taking any medicine, including all over-the-counter and prescription medicine as well as vitamin and herbal supplements. It is possible to become pregnant while breastfeeding.  If birth control is desired, ask your health care provider about options that will be safe for your baby. Contact a health care provider if:  You feel like you want to stop breastfeeding or have become frustrated with breastfeeding.  You have painful breasts or nipples.  Your nipples are cracked or bleeding.  Your breasts are red, tender, or warm.  You have a swollen area on either breast.  You have a fever or chills.  You have nausea or vomiting.  You have drainage other than breast milk from your nipples.  Your breasts do not become full before feedings by the fifth day after you give birth.  You feel sad and depressed.  Your baby is too sleepy to eat well.  Your baby is having trouble sleeping.  Your baby is wetting less than 3 diapers in a 24-hour period.  Your baby has less than 3 stools in a 24-hour period.  Your baby's skin or the white part of his or her eyes becomes yellow.  Your baby is not gaining weight by 5 days of age. Get help right away if:  Your baby is overly tired (lethargic) and does not want to wake up and feed.  Your baby develops an unexplained fever. This information is not intended to replace advice given to you by your health care provider. Make sure you discuss any questions you have with your health care provider. Document Released: 04/04/2005 Document Revised: 09/16/2015 Document Reviewed: 09/26/2012 [employer] Interactive Patient Education  2017 [employer]  

## 2016-11-16 ENCOUNTER — Encounter (HOSPITAL_COMMUNITY): Payer: Self-pay | Admitting: *Deleted

## 2016-11-16 ENCOUNTER — Inpatient Hospital Stay (HOSPITAL_COMMUNITY)
Admission: AD | Admit: 2016-11-16 | Discharge: 2016-11-16 | Disposition: A | Discharge status: Home / Self Care | Payer: Medicaid Other | Source: Ambulatory Visit | Attending: Obstetrics & Gynecology | Admitting: Obstetrics & Gynecology

## 2016-11-16 DIAGNOSIS — Z3A21 21 weeks gestation of pregnancy: Secondary | ICD-10-CM | POA: Diagnosis not present

## 2016-11-16 DIAGNOSIS — O26892 Other specified pregnancy related conditions, second trimester: Secondary | ICD-10-CM

## 2016-11-16 DIAGNOSIS — R1032 Left lower quadrant pain: Secondary | ICD-10-CM | POA: Insufficient documentation

## 2016-11-16 DIAGNOSIS — O368120 Decreased fetal movements, second trimester, not applicable or unspecified: Secondary | ICD-10-CM | POA: Insufficient documentation

## 2016-11-16 DIAGNOSIS — O26899 Other specified pregnancy related conditions, unspecified trimester: Secondary | ICD-10-CM

## 2016-11-16 DIAGNOSIS — R102 Pelvic and perineal pain: Secondary | ICD-10-CM | POA: Diagnosis not present

## 2016-11-16 LAB — URINALYSIS, ROUTINE W REFLEX MICROSCOPIC
Bilirubin Urine: NEGATIVE
Glucose, UA: NEGATIVE mg/dL
Hgb urine dipstick: NEGATIVE
Ketones, ur: NEGATIVE mg/dL
Leukocytes, UA: NEGATIVE
Nitrite: NEGATIVE
Protein, ur: NEGATIVE mg/dL
Specific Gravity, Urine: 1.002 — ABNORMAL LOW (ref 1.005–1.030)
WBC, UA: NONE SEEN WBC/hpf (ref 0–5)
pH: 7 (ref 5.0–8.0)

## 2016-11-16 NOTE — [facility] Note (Signed)
Pt reports she has not felt baby move much since yesterday. C/o some sharp pian in her LLQ as well

## 2016-11-16 NOTE — MAU Provider Note (Signed)
History     CSN: [id number]  Arrival date and time: 11/16/16 2035   First Provider Initiated Contact with Patient 11/16/16 2146      Chief Complaint  Patient presents with  . Decreased Fetal Movement  . Abdominal Pain   G1 @21  wks here with decreased FM and LLQ pain. Reports no FM in 2 days. LLQ pain occurred today. She's had this pain before. Describes as sharp and intermittent. Worse with standing and reaching above head. In past has occurred on right side as well. No VB or LOF.     OB History    Gravida Para Term Preterm AB Living   1         0   SAB TAB Ectopic Multiple Live Births                  Past Medical History:  Diagnosis Date  . Hyperemesis gravidarum   . Medical history non-contributory     Past Surgical History:  Procedure Laterality Date  . NO PAST SURGERIES      No family history on file.  Social History  Substance Use Topics  . Smoking status: Never Smoker  . Smokeless tobacco: Never Used  . Alcohol use No    Allergies: No Known Allergies  Prescriptions Prior to Admission  Medication Sig Dispense Refill Last Dose  . omeprazole (PRILOSEC) 20 MG capsule Take 1 capsule (20 mg total) by mouth daily. 30 capsule 6 Taking  . Prenat w/o A Vit-FeFum-FePo-FA (CONCEPT OB) 130-92.4-1 MG CAPS Take 1 capsule by mouth daily. 30 capsule 11   . promethazine (PHENERGAN) 25 MG tablet Take 1 tablet (25 mg total) by mouth every 6 (six) hours as needed for nausea or vomiting. 30 tablet 0 Taking    Review of Systems  Gastrointestinal: Positive for abdominal pain (LLQ).  Genitourinary: Negative for vaginal bleeding and vaginal discharge.   Physical Exam   Blood pressure 121/76, pulse 96, temperature 97.9 F (36.6 C), resp. rate 18, height 5' 2.5" (1.588 m), weight 146 lb 1.9 oz (66.3 kg), last menstrual period 06/22/2016.  Physical Exam  Constitutional: She is oriented to person, place, and time. She appears well-developed and well-nourished. No distress  (appears comfortable).  HENT:  Head: Normocephalic and atraumatic.  Neck: Normal range of motion.  Respiratory: Effort normal. No respiratory distress.  GI: Soft. She exhibits no distension and no mass. There is no tenderness. There is no rebound and no guarding.  gravid  Musculoskeletal: Normal range of motion.  Neurological: She is alert and oriented to person, place, and time.  Skin: Skin is warm and dry.  Psychiatric: She has a normal mood and affect.   FHT: 146  Results for orders placed or performed during the hospital encounter of 11/16/16 (from the past 24 hour(s))  Urinalysis, Routine w reflex microscopic     Status: Abnormal   Collection Time: 11/16/16  8:45 PM  Result Value Ref Range   Color, Urine COLORLESS (A) YELLOW   APPearance CLEAR CLEAR   Specific Gravity, Urine 1.002 (L) 1.005 - 1.030   pH 7.0 5.0 - 8.0   Glucose, UA NEGATIVE NEGATIVE mg/dL   Hgb urine dipstick NEGATIVE NEGATIVE   Bilirubin Urine NEGATIVE NEGATIVE   Ketones, ur NEGATIVE NEGATIVE mg/dL   Protein, ur NEGATIVE NEGATIVE mg/dL   Nitrite NEGATIVE NEGATIVE   Leukocytes, UA NEGATIVE NEGATIVE   RBC / HPF 0-5 0 - 5 RBC/hpf   WBC, UA NONE SEEN 0 - 5  WBC/hpf   Bacteria, UA RARE (A) NONE SEEN   Squamous Epithelial / LPF 0-5 (A) NONE SEEN   MAU Course  Procedures  MDM Labs ordered and reviewed. No evidence of PTL or UTI. Pain likely round ligament. Discussed comfort measures. Reassured pt that FM is not consistent at this gestation as she may not be feeling all movements. Stable for discharge home.    Assessment and Plan   1. [redacted] weeks gestation of pregnancy   2. Pain of round ligament during pregnancy   3. Decreased fetal movements in second trimester, single or unspecified fetus    Discharge home Follow up in OB office as scheduled PTL precautions  Allergies as of 11/16/2016   No Known Allergies     Medication List    TAKE these medications   CONCEPT OB 130-92.4-1 MG Caps Take 1 capsule  by mouth daily.   omeprazole 20 MG capsule Commonly known as:  PRILOSEC Take 1 capsule (20 mg total) by mouth daily.   promethazine 25 MG tablet Commonly known as:  PHENERGAN Take 1 tablet (25 mg total) by mouth every 6 (six) hours as needed for nausea or vomiting.      Donette LarryMelanie Danylah Holden, CNM 11/16/2016, 9:52 PM

## 2016-11-16 NOTE — Discharge Instructions (Signed)

## 2016-11-22 ENCOUNTER — Encounter: Payer: Self-pay | Admitting: Student

## 2016-11-22 ENCOUNTER — Encounter: Payer: Self-pay | Admitting: Family Medicine

## 2016-11-30 ENCOUNTER — Other Ambulatory Visit: Payer: Self-pay | Admitting: Family Medicine

## 2016-11-30 ENCOUNTER — Ambulatory Visit (HOSPITAL_COMMUNITY)
Admission: RE | Admit: 2016-11-30 | Discharge: 2016-11-30 | Disposition: A | Discharge status: Home / Self Care | Payer: Medicaid Other | Source: Ambulatory Visit | Attending: Family Medicine | Admitting: Family Medicine

## 2016-11-30 DIAGNOSIS — Z3400 Encounter for supervision of normal first pregnancy, unspecified trimester: Secondary | ICD-10-CM

## 2016-11-30 DIAGNOSIS — Z09 Encounter for follow-up examination after completed treatment for conditions other than malignant neoplasm: Secondary | ICD-10-CM

## 2016-11-30 DIAGNOSIS — Z3402 Encounter for supervision of normal first pregnancy, second trimester: Secondary | ICD-10-CM | POA: Diagnosis not present

## 2016-11-30 DIAGNOSIS — Z3A23 23 weeks gestation of pregnancy: Secondary | ICD-10-CM | POA: Insufficient documentation

## 2016-11-30 DIAGNOSIS — Z362 Encounter for other antenatal screening follow-up: Secondary | ICD-10-CM

## 2016-11-30 DIAGNOSIS — Z3689 Encounter for other specified antenatal screening: Secondary | ICD-10-CM | POA: Insufficient documentation

## 2016-12-12 ENCOUNTER — Ambulatory Visit (INDEPENDENT_AMBULATORY_CARE_PROVIDER_SITE_OTHER): Payer: Medicaid Other | Admitting: Medical

## 2016-12-12 ENCOUNTER — Encounter: Payer: Self-pay | Admitting: Medical

## 2016-12-12 VITALS — BP 111/52 | HR 84 | Wt 147.9 lb

## 2016-12-12 DIAGNOSIS — Z3400 Encounter for supervision of normal first pregnancy, unspecified trimester: Secondary | ICD-10-CM

## 2016-12-12 MED ORDER — TUBERCULIN PPD 5 UNIT/0.1ML ID SOLN
5.0000 [IU] | Freq: Once | INTRADERMAL | Status: AC
  Administered 2016-12-12: 5 [IU] via INTRADERMAL

## 2016-12-12 NOTE — Progress Notes (Signed)
TB SKIN TEST GIVEN IN RIGHT FOREARM. WILL RETURN IN 2-3 DAYS TO HAVE READ.

## 2016-12-12 NOTE — Patient Instructions (Signed)
Second Trimester of Pregnancy The second trimester is from week 13 through week 28, month 4 through 6. This is often the time in pregnancy that you feel your best. Often times, morning sickness has lessened or quit. You may have more energy, and you may get hungry more often. Your unborn baby (fetus) is growing rapidly. At the end of the sixth month, he or she is about 9 inches long and weighs about 1 pounds. You will likely feel the baby move (quickening) between 18 and 20 weeks of pregnancy. Follow these instructions at home:  Avoid all smoking, herbs, and alcohol. Avoid drugs not approved by your doctor.  Do not use any tobacco products, including cigarettes, chewing tobacco, and electronic cigarettes. If you need help quitting, ask your doctor. You may get counseling or other support to help you quit.  Only take medicine as told by your doctor. Some medicines are safe and some are not during pregnancy.  Exercise only as told by your doctor. Stop exercising if you start having cramps.  Eat regular, healthy meals.  Wear a good support bra if your breasts are tender.  Do not use hot tubs, steam rooms, or saunas.  Wear your seat belt when driving.  Avoid raw meat, uncooked cheese, and liter boxes and soil used by cats.  Take your prenatal vitamins.  Take 1500-2000 milligrams of calcium daily starting at the 20th week of pregnancy until you deliver your baby.  Try taking medicine that helps you poop (stool softener) as needed, and if your doctor approves. Eat more fiber by eating fresh fruit, vegetables, and whole grains. Drink enough fluids to keep your pee (urine) clear or pale yellow.  Take warm water baths (sitz baths) to soothe pain or discomfort caused by hemorrhoids. Use hemorrhoid cream if your doctor approves.  If you have puffy, bulging veins (varicose veins), wear support hose. Raise (elevate) your feet for 15 minutes, 3-4 times a day. Limit salt in your diet.  Avoid heavy  lifting, wear low heals, and sit up straight.  Rest with your legs raised if you have leg cramps or low back pain.  Visit your dentist if you have not gone during your pregnancy. Use a soft toothbrush to brush your teeth. Be gentle when you floss.  You can have sex (intercourse) unless your doctor tells you not to.  Go to your doctor visits. Get help if:  You feel dizzy.  You have mild cramps or pressure in your lower belly (abdomen).  You have a nagging pain in your belly area.  You continue to feel sick to your stomach (nauseous), throw up (vomit), or have watery poop (diarrhea).  You have bad smelling fluid coming from your vagina.  You have pain with peeing (urination). Get help right away if:  You have a fever.  You are leaking fluid from your vagina.  You have spotting or bleeding from your vagina.  You have severe belly cramping or pain.  You lose or gain weight rapidly.  You have trouble catching your breath and have chest pain.  You notice sudden or extreme puffiness (swelling) of your face, hands, ankles, feet, or legs.  You have not felt the baby move in over an hour.  You have severe headaches that do not go away with medicine.  You have vision changes. This information is not intended to replace advice given to you by your health care provider. Make sure you discuss any questions you have with your health care   provider. Document Released: 06/29/2009 Document Revised: 09/10/2015 Document Reviewed: 06/05/2012 Elsevier Interactive Patient Education  2017 Elsevier Inc.  

## 2016-12-12 NOTE — Progress Notes (Signed)
   PRENATAL VISIT NOTE  Subjective:  Lisa Reese is a 26 y.o. G1P0 at [redacted]w[redacted]d being seen today for ongoing prenatal care.  She is currently monitored for the following issues for this low-risk pregnancy and has Supervision of normal first pregnancy, antepartum and Low grade squamous intraepith lesion on cytologic smear cervix (lgsil) on her problem list.  Patient reports backache.  Contractions: Irritability. Vag. Bleeding: None.  Movement: Present. Denies leaking of fluid.   The following portions of the patient's history were reviewed and updated as appropriate: allergies, current medications, past family history, past medical history, past social history, past surgical history and problem list. Problem list updated.  Objective:   Vitals:   12/12/16 1537 12/12/16 1545  BP: (!) 107/49 (!) 111/52  Pulse: 79 84  Weight: 147 lb 14.4 oz (67.1 kg)     Fetal Status: Fetal Heart Rate (bpm): 145 Fundal Height: 23 cm Movement: Present     General:  Alert, oriented and cooperative. Patient is in no acute distress.  Skin: Skin is warm and dry. No rash noted.   Cardiovascular: Normal heart rate noted  Respiratory: Normal respiratory effort, no problems with respiration noted  Abdomen: Soft, gravid, appropriate for gestational age.  Pain/Pressure: Present     Pelvic: Cervical exam deferred        Extremities: Normal range of motion.  Edema: None  Mental Status:  Normal mood and affect. Normal behavior. Normal judgment and thought content.   Assessment and Plan:  Pregnancy: G1P0 at [redacted]w[redacted]d  1. Supervision of normal first pregnancy, antepartum - Doing well - Occasional back aches - advised Tylenol PRN, abdominal binder and heat to the affected area - Pre-work paperwork completed and TB test given by CMA today   Preterm labor symptoms and general obstetric precautions including but not limited to vaginal bleeding, contractions, leaking of fluid and fetal movement were reviewed in detail  with the patient. Please refer to After Visit Summary for other counseling recommendations.  Return in about 4 weeks (around 01/09/2017) for LOB with 2 hour GTT.   Vonzella Nipple, PA-C

## 2016-12-12 NOTE — Progress Notes (Signed)
Patient here for Routine OB visit [redacted]w[redacted]d.

## 2016-12-14 ENCOUNTER — Ambulatory Visit: Payer: Medicaid Other | Admitting: General Practice

## 2016-12-14 ENCOUNTER — Ambulatory Visit (HOSPITAL_COMMUNITY)
Admission: RE | Admit: 2016-12-14 | Discharge: 2016-12-14 | Disposition: A | Discharge status: Home / Self Care | Payer: Medicaid Other | Source: Ambulatory Visit | Attending: Obstetrics and Gynecology | Admitting: Obstetrics and Gynecology

## 2016-12-14 DIAGNOSIS — R7611 Nonspecific reaction to tuberculin skin test without active tuberculosis: Secondary | ICD-10-CM

## 2016-12-14 NOTE — Progress Notes (Signed)
Patient here for TB skin test reading. Right forearm reveals redness and mild swelling. Induration area 1.3cm x 1.3 cm. Patient denies night sweats, productive cough or weight loss. Per Dr Vergie LivingPickens, patient should have chest x-ray. Xray ordered and patient taken upstairs.  Patient returned after xray and waited 15 minutes but xray still not read by radiology. Encouraged patient to return tomorrow for results & to have work form completed. Patient verbalized understanding & had no questions

## 2016-12-15 ENCOUNTER — Encounter: Payer: Self-pay | Admitting: General Practice

## 2016-12-15 NOTE — Progress Notes (Signed)
Patient came by office for chest x ray results. Informed patient of negative chest xray and signed paper for her job. Discussed with patient that our provider wants her to be seen at the [facility] at the [facility] for follow up and possible medication therapy. Patient verbalized understanding & will await phone call from [facility]. Called Tammy at [facility] clinic [facility] and left message detailing patient info, results, & need for referral. Have asked her to call back in regards for process of referral

## 2016-12-27 ENCOUNTER — Encounter: Payer: Self-pay | Admitting: Obstetrics & Gynecology

## 2016-12-27 ENCOUNTER — Encounter: Payer: Self-pay | Admitting: Student

## 2016-12-29 ENCOUNTER — Encounter (HOSPITAL_COMMUNITY): Payer: Self-pay | Admitting: *Deleted

## 2016-12-29 ENCOUNTER — Inpatient Hospital Stay (HOSPITAL_COMMUNITY)
Admission: AD | Admit: 2016-12-29 | Discharge: 2016-12-29 | Disposition: A | Discharge status: Home / Self Care | Payer: Medicaid Other | Source: Ambulatory Visit | Attending: Obstetrics and Gynecology | Admitting: Obstetrics and Gynecology

## 2016-12-29 DIAGNOSIS — H109 Unspecified conjunctivitis: Secondary | ICD-10-CM | POA: Insufficient documentation

## 2016-12-29 DIAGNOSIS — O26892 Other specified pregnancy related conditions, second trimester: Secondary | ICD-10-CM | POA: Diagnosis present

## 2016-12-29 DIAGNOSIS — H578 Other specified disorders of eye and adnexa: Secondary | ICD-10-CM | POA: Diagnosis present

## 2016-12-29 DIAGNOSIS — Z3A27 27 weeks gestation of pregnancy: Secondary | ICD-10-CM | POA: Diagnosis not present

## 2016-12-29 DIAGNOSIS — R05 Cough: Secondary | ICD-10-CM | POA: Diagnosis present

## 2016-12-29 DIAGNOSIS — O9989 Other specified diseases and conditions complicating pregnancy, childbirth and the puerperium: Secondary | ICD-10-CM | POA: Insufficient documentation

## 2016-12-29 DIAGNOSIS — H1033 Unspecified acute conjunctivitis, bilateral: Secondary | ICD-10-CM

## 2016-12-29 LAB — URINALYSIS, ROUTINE W REFLEX MICROSCOPIC
Bilirubin Urine: NEGATIVE
Glucose, UA: NEGATIVE mg/dL
Hgb urine dipstick: NEGATIVE
Ketones, ur: NEGATIVE mg/dL
LEUKOCYTES UA: NEGATIVE
NITRITE: NEGATIVE
Protein, ur: NEGATIVE mg/dL
Specific Gravity, Urine: 1.004 — ABNORMAL LOW (ref 1.005–1.030)
pH: 7 (ref 5.0–8.0)

## 2016-12-29 MED ORDER — POLYMYXIN B-TRIMETHOPRIM 10000-0.1 UNIT/ML-% OP SOLN: 1.0000 [drp] | Freq: Four times a day (QID) | OPHTHALMIC | 0 refills | Status: DC

## 2016-12-29 NOTE — Discharge Instructions (Signed)
Bacterial Conjunctivitis Bacterial conjunctivitis is an infection of your conjunctiva. This is the clear membrane that covers the white part of your eye and the inner surface of your eyelid. This condition can make your eye:  Red or pink.  Itchy.  This condition is caused by bacteria. This condition spreads very easily from person to person (is contagious) and from one eye to the other eye. Follow these instructions at home: Medicines  Take or apply your antibiotic medicine as told by your doctor. Do not stop taking or applying the antibiotic even if you start to feel better.  Take or apply over-the-counter and prescription medicines only as told by your doctor.  Do not touch your eyelid with the eye drop bottle or the ointment tube. Managing discomfort  Wipe any fluid from your eye with a warm, wet washcloth or a cotton ball.  Place a cool, clean washcloth on your eye. Do this for 10-20 minutes, 3-4 times per day. General instructions  Do not wear contact lenses until the irritation is gone. Wear glasses until your doctor says it is okay to wear contacts.  Do not wear eye makeup until your symptoms are gone. Throw away any old makeup.  Change or wash your pillowcase every day.  Do not share towels or washcloths with anyone.  Wash your hands often with soap and water. Use paper towels to dry your hands.  Do not touch or rub your eyes.  Do not drive or use heavy machinery if your vision is blurry. Contact a doctor if:  You have a fever.  Your symptoms do not get better after 10 days. Get help right away if:  You have a fever and your symptoms suddenly get worse.  You have very bad pain when you move your eye.  Your face: ? Hurts. ? Is red. ? Is swollen.  You have sudden loss of vision. This information is not intended to replace advice given to you by your health care provider. Make sure you discuss any questions you have with your health care provider. Document  Released: 01/12/2008 Document Revised: 09/10/2015 Document Reviewed: 01/15/2015 Elsevier Interactive Patient Education  2018 [employer].  How to Use Eye Drops and Eye Ointments How to apply eye drops Follow these steps when applying eye drops: 1. Wash your hands. 2. Tilt your head back. 3. Put a finger under your eye and use it to gently pull your lower lid downward. Keep that finger in place. 4. Using your other hand, hold the dropper between your thumb and index finger. 5. Position the dropper just over the edge of the lower lid. Hold it as close to your eye as you can without touching the dropper to your eye. 6. Steady your hand. One way to do this is to lean your index finger against your brow. 7. Look up. 8. Slowly and gently squeeze one drop of medicine into your eye. 9. Close your eye. 10. Place a finger between your lower eyelid and your nose. Press gently for 2 minutes. This increases the amount of time that the medicine is exposed to the eye. It also reduces side effects that can develop if the drop gets into the bloodstream through the nose.  How to apply eye ointments Follow these steps when applying eye ointments: 1. Wash your hands. 2. Put a finger under your eye and use it to gently pull your lower lid downward. Keep that finger in place. 3. Using your other hand, place the tip of  the tube between your thumb and index finger with the remaining fingers braced against your cheek or nose. 4. Hold the tube just over the edge of your lower lid without touching the tube to your lid or eyeball. 5. Look up. 6. Line the inner part of your lower lid with ointment. 7. Gently pull up on your upper lid and look down. This will force the ointment to spread over the surface of the eye. 8. Release the upper lid. 9. If you can, close your eyes for 1-2 minutes.  Do not rub your eyes. If you applied the ointment correctly, your vision will be blurry for a few minutes. This is  normal. Additional information  Make sure to use the eye drops or ointment as told by your health care provider.  If you have been told to use both eye drops and an eye ointment, apply the eye drops first, then wait 3-4 minutes before you apply the ointment.  Try not to touch the tip of the dropper or tube to your eye. A dropper or tube that has touched the eye can become contaminated. This information is not intended to replace advice given to you by your health care provider. Make sure you discuss any questions you have with your health care provider. Document Released: 07/11/2000 Document Revised: 09/03/2015 Document Reviewed: 03/31/2014 Elsevier Interactive Patient Education  [employer]2018 Elsevier Inc.

## 2016-12-29 NOTE — MAU Note (Addendum)
Pt presents with c/o bilateral eye redness that began 5 days ago, but this morning pt noted bilat eyes itching and draining.  Reports eyes were crusted together this morning.  Pt also reports having cough since last week, currently taking Robitussin & Loratidine.

## 2016-12-29 NOTE — MAU Note (Signed)
Pt presents with complaint of red, itching eyes and states there is drainage from her eyes and pain when she bends forward. Also reports a nonproductive cough.

## 2016-12-29 NOTE — MAU Provider Note (Signed)
History     CSN: [id number]  Arrival date and time: 12/29/16 [id number]   First Provider Initiated Contact with Patient 12/29/16 1006      Chief Complaint  Patient presents with  . Cough  . Eye irritation   HPI Ms. Lisa Reese is a 26 y.o. G1P0 at 5563w1d who presents to MAU today with complaint of pain, redness and discharge from both eyes. Symptoms started 4-5 days ago and have worsening today. She states eyes were "stuck shut" this morning. She works in a daycare. She denies fever, nasal congestion, ear pain, contractions, vaginal bleeding, LOF or complications with the pregnancy. She reports some cough recently. She reports normal fetal movement.   OB History    Gravida Para Term Preterm AB Living   1         0   SAB TAB Ectopic Multiple Live Births                  Past Medical History:  Diagnosis Date  . Hyperemesis gravidarum   . Medical history non-contributory     Past Surgical History:  Procedure Laterality Date  . NO PAST SURGERIES      History reviewed. No pertinent family history.  Social History  Substance Use Topics  . Smoking status: Never Smoker  . Smokeless tobacco: Never Used  . Alcohol use No    Allergies: No Known Allergies  Prescriptions Prior to Admission  Medication Sig Dispense Refill Last Dose  . guaiFENesin-dextromethorphan (ROBITUSSIN DM) 100-10 MG/5ML syrup Take 10 mLs by mouth every 4 (four) hours as needed for cough.   12/29/2016 at Unknown time  . loratadine (CLARITIN) 10 MG tablet Take 10 mg by mouth daily as needed for allergies.   12/29/2016 at Unknown time  . omeprazole (PRILOSEC) 20 MG capsule Take 1 capsule (20 mg total) by mouth daily. 30 capsule 6 12/29/2016 at Unknown time  . Prenat w/o A Vit-FeFum-FePo-FA (CONCEPT OB) 130-92.4-1 MG CAPS Take 1 capsule by mouth daily. 30 capsule 11 12/29/2016 at Unknown time  . promethazine (PHENERGAN) 25 MG tablet Take 1 tablet (25 mg total) by mouth every 6 (six) hours as needed for nausea or  vomiting. (Patient not taking: Reported on 12/12/2016) 30 tablet 0 Not Taking    Review of Systems  Constitutional: Negative for fever.  HENT: Negative for congestion, ear discharge, ear pain, rhinorrhea, sinus pain, sinus pressure and sore throat.   Eyes: Positive for pain, discharge, redness and itching.  Respiratory: Positive for cough.   Gastrointestinal: Negative for abdominal pain, constipation, diarrhea, nausea and vomiting.  Genitourinary: Negative for vaginal bleeding and vaginal discharge.   Physical Exam   Blood pressure 124/71, pulse 97, temperature 98.2 F (36.8 C), temperature source Oral, resp. rate 18, height 5' 2.5" (1.588 m), weight 154 lb (69.9 kg), last menstrual period 06/22/2016, SpO2 99 %.  Physical Exam  Nursing note and vitals reviewed. Constitutional: She is oriented to person, place, and time. She appears well-developed and well-nourished. No distress.  HENT:  Head: Normocephalic and atraumatic.  Right Ear: Tympanic membrane, external ear and ear canal normal.  Left Ear: Tympanic membrane, external ear and ear canal normal.  Nose: No mucosal edema or rhinorrhea. Right sinus exhibits no maxillary sinus tenderness and no frontal sinus tenderness. Left sinus exhibits no maxillary sinus tenderness and no frontal sinus tenderness.  Mouth/Throat: Oropharynx is clear and moist and mucous membranes are normal. No oropharyngeal exudate, posterior oropharyngeal edema, posterior oropharyngeal erythema or tonsillar abscesses.  Eyes: Pupils are equal, round, and reactive to light. EOM are normal. Right eye exhibits discharge. Left eye exhibits discharge. Right conjunctiva is injected. Left conjunctiva is injected.  Cardiovascular: Normal rate.   Respiratory: Effort normal.  GI: Soft. She exhibits no distension and no mass. There is no tenderness. There is no rebound and no guarding.  Neurological: She is alert and oriented to person, place, and time.  Skin: Skin is warm and  dry. No erythema.  Psychiatric: She has a normal mood and affect.    Results for orders placed or performed during the hospital encounter of 12/29/16 (from the past 24 hour(s))  Urinalysis, Routine w reflex microscopic     Status: Abnormal   Collection Time: 12/29/16  9:38 AM  Result Value Ref Range   Color, Urine STRAW (A) YELLOW   APPearance CLEAR CLEAR   Specific Gravity, Urine 1.004 (L) 1.005 - 1.030   pH 7.0 5.0 - 8.0   Glucose, UA NEGATIVE NEGATIVE mg/dL   Hgb urine dipstick NEGATIVE NEGATIVE   Bilirubin Urine NEGATIVE NEGATIVE   Ketones, ur NEGATIVE NEGATIVE mg/dL   Protein, ur NEGATIVE NEGATIVE mg/dL   Nitrite NEGATIVE NEGATIVE   Leukocytes, UA NEGATIVE NEGATIVE    Fetal Monitoring: Baseline: 130 bpm Variability: moderate Accelerations: 10 x 10 and 15 x 15 Decelerations: none Contractions: none  MAU Course  Procedures None  MDM UA today -normal  Assessment and Plan  A: SIUP at [redacted]w[redacted]d Bilateral conjunctivitis  P: Discharge home Rx for Polytrim drops given  Warning signs for worsening condition discussed Patient advised to follow-up with [facility] as scheduled for routine prenatal care Patient may return to MAU as needed or if her condition were to change or worsen   Vonzella Nipple, PA-C 12/29/2016, 10:55 AM

## 2017-01-09 ENCOUNTER — Ambulatory Visit (INDEPENDENT_AMBULATORY_CARE_PROVIDER_SITE_OTHER): Payer: Medicaid Other | Admitting: Obstetrics & Gynecology

## 2017-01-09 ENCOUNTER — Inpatient Hospital Stay (HOSPITAL_COMMUNITY)
Admission: AD | Admit: 2017-01-09 | Discharge: 2017-01-09 | Disposition: A | Discharge status: Home / Self Care | Payer: Medicaid Other | Source: Ambulatory Visit | Attending: Family Medicine | Admitting: Family Medicine

## 2017-01-09 ENCOUNTER — Encounter (HOSPITAL_COMMUNITY): Payer: Self-pay | Admitting: *Deleted

## 2017-01-09 VITALS — BP 112/67 | HR 72 | Wt 150.9 lb

## 2017-01-09 DIAGNOSIS — O26892 Other specified pregnancy related conditions, second trimester: Secondary | ICD-10-CM

## 2017-01-09 DIAGNOSIS — R102 Pelvic and perineal pain: Secondary | ICD-10-CM | POA: Diagnosis not present

## 2017-01-09 DIAGNOSIS — R109 Unspecified abdominal pain: Secondary | ICD-10-CM | POA: Diagnosis present

## 2017-01-09 DIAGNOSIS — Z3A28 28 weeks gestation of pregnancy: Secondary | ICD-10-CM | POA: Diagnosis not present

## 2017-01-09 DIAGNOSIS — Z23 Encounter for immunization: Secondary | ICD-10-CM | POA: Diagnosis not present

## 2017-01-09 DIAGNOSIS — Z3400 Encounter for supervision of normal first pregnancy, unspecified trimester: Secondary | ICD-10-CM

## 2017-01-09 DIAGNOSIS — O26899 Other specified pregnancy related conditions, unspecified trimester: Secondary | ICD-10-CM

## 2017-01-09 DIAGNOSIS — Z3403 Encounter for supervision of normal first pregnancy, third trimester: Secondary | ICD-10-CM

## 2017-01-09 DIAGNOSIS — O26893 Other specified pregnancy related conditions, third trimester: Secondary | ICD-10-CM | POA: Diagnosis not present

## 2017-01-09 LAB — URINALYSIS, ROUTINE W REFLEX MICROSCOPIC
BILIRUBIN URINE: NEGATIVE
GLUCOSE, UA: NEGATIVE mg/dL
HGB URINE DIPSTICK: NEGATIVE
Ketones, ur: NEGATIVE mg/dL
Leukocytes, UA: NEGATIVE
Nitrite: NEGATIVE
PROTEIN: NEGATIVE mg/dL
Specific Gravity, Urine: 1.01 (ref 1.005–1.030)
pH: 7 (ref 5.0–8.0)

## 2017-01-09 MED ORDER — TETANUS-DIPHTH-ACELL PERTUSSIS 5-2.5-18.5 LF-MCG/0.5 IM SUSP
0.5000 mL | Freq: Once | INTRAMUSCULAR | Status: AC
  Administered 2017-01-09: 0.5 mL via INTRAMUSCULAR

## 2017-01-09 MED ORDER — COMFORT FIT MATERNITY SUPP SM MISC: 1.0000 [IU] | Freq: Every day | 0 refills | Status: DC | PRN

## 2017-01-09 NOTE — MAU Provider Note (Signed)
Chief Complaint:  Abdominal Pain   First Provider Initiated Contact with Patient 01/09/17 1517     Abdominal Pain  This is a new problem. The current episode started today. The onset quality is sudden. The problem has been waxing and waning. The pain is located in the suprapubic region. The quality of the pain is aching. The abdominal pain does not radiate. Pertinent negatives include no dysuria, fever, frequency or vomiting. The pain is aggravated by certain positions and movement. The pain is relieved by being still.    HPI: Lisa Reese is a 26 y.o. G1P0 at 69w5dwho presents to maternity admissions reporting lower abdominal pain. She reports good fetal movement, denies LOF, vaginal bleeding, vaginal itching/burning, urinary symptoms, h/a, dizziness, n/v, diarrhea, constipation or fever/chills.  She denies headache, visual changes or RUQ abdominal pain.   Past Medical History: Past Medical History:  Diagnosis Reese  . Hyperemesis gravidarum   . Medical history non-contributory     Past obstetric history: OB History  Gravida Para Term Preterm AB Living  1         0  SAB TAB Ectopic Multiple Live Births               # Outcome Reese GA Lbr Len/2nd Weight Sex Delivery Anes PTL Lv  1 Current               Past Surgical History: Past Surgical History:  Procedure Laterality Reese  . NO PAST SURGERIES      Family History: No family history on file.  Social History: Social History  Substance Use Topics  . Smoking status: Never Smoker  . Smokeless tobacco: Never Used  . Alcohol use No    Allergies: No Known Allergies  Meds:  Prescriptions Prior to Admission  Medication Sig Dispense Refill Last Dose  . omeprazole (PRILOSEC) 20 MG capsule Take 1 capsule (20 mg total) by mouth daily. 30 capsule 6 Taking  . Prenat w/o A Vit-FeFum-FePo-FA (CONCEPT OB) 130-92.4-1 MG CAPS Take 1 capsule by mouth daily. 30 capsule 11 Taking  . trimethoprim-polymyxin b (POLYTRIM) ophthalmic  solution Place 1 drop into both eyes every 6 (six) hours. 10 mL 0 Taking    I have reviewed patient's Past Medical Hx, Surgical Hx, Family Hx, Social Hx, medications and allergies.   ROS:  Review of Systems  Constitutional: Negative for fever.  Gastrointestinal: Positive for abdominal pain. Negative for vomiting.  Genitourinary: Negative for dysuria and frequency.   Other systems negative  Physical Exam  Patient Vitals for the past 24 hrs:  BP Temp Pulse Resp SpO2 Height Weight  01/09/17 1447 (!) 109/57 (!) 97.5 F (36.4 C) 74 18 100 % 5' 2.5" (1.588 m) 69.9 kg (154 lb)   Constitutional: Well-developed, well-nourished female in no acute distress.  Cardiovascular: normal rate and rhythm Respiratory: normal effort GI: Abd soft, non-tender, gravid appropriate for gestational age. No rebound or guarding. MS: Extremities nontender, no edema, normal ROM Neurologic: Alert and oriented x 4.  GU: Closed cervix    FHT:  Baseline 135, moderate variability, accelerations present, no decelerations Contractions: 1 seen on monitor over 1 hour period   Labs: Results for orders placed or performed during the hospital encounter of 01/09/17 (from the past 24 hour(s))  Urinalysis, Routine w reflex microscopic     Status: Abnormal   Collection Time: 01/09/17  2:50 PM  Result Value Ref Range   Color, Urine STRAW (A) YELLOW   APPearance CLEAR CLEAR   Specific  Gravity, Urine 1.010 1.005 - 1.030   pH 7.0 5.0 - 8.0   Glucose, UA NEGATIVE NEGATIVE mg/dL   Hgb urine dipstick NEGATIVE NEGATIVE   Bilirubin Urine NEGATIVE NEGATIVE   Ketones, ur NEGATIVE NEGATIVE mg/dL   Protein, ur NEGATIVE NEGATIVE mg/dL   Nitrite NEGATIVE NEGATIVE   Leukocytes, UA NEGATIVE NEGATIVE   O/Positive/-- (05/29 1608)  Imaging: None  MAU Course/MDM: I have ordered labs and reviewed results.  NST reviewed  Assessment: 1. Pain of round ligament during pregnancy   2. [redacted] weeks gestation of pregnancy      Plan: Recommend a maternity support belt and slow position changes Discharge home Labor precautions and fetal kick counts Follow up in Office for prenatal visits and recheck   Pt stable at time of discharge.  Elna Breslow, MS3 01/09/2017 3:18 PM   I confirm that I have verified the information documented in the med student's note and that I have also personally performed the physical exam and all medical decision making activities.  Judeth Horn, NP

## 2017-01-09 NOTE — MAU Note (Signed)
Pt presents with c/o lower intermittent abdominal pain that began today.  Denies LOF or VB.  Reports +FM.

## 2017-01-09 NOTE — MAU Note (Addendum)
Pt reports she had lower abd pain/pressure when she gets up or moves around. Pain is gone when she sits. Good fetal movement felt and denies any vaginal bleeding or discharge.Pt had appointment 2 hours ago and did not have any pain . Started when she got home.

## 2017-01-09 NOTE — Progress Notes (Signed)
   PRENATAL VISIT NOTE  Subjective:  Lisa Reese is a 26 y.o. G1P0 at [redacted]w[redacted]d being seen today for ongoing prenatal care.  She is currently monitored for the following issues for this low-risk pregnancy and has Supervision of normal first pregnancy, antepartum and Low grade squamous intraepith lesion on cytologic smear cervix (lgsil) on her problem list.  Patient reports no complaints.  Contractions: Not present. Vag. Bleeding: None.  Movement: Present. Denies leaking of fluid.   The following portions of the patient's history were reviewed and updated as appropriate: allergies, current medications, past family history, past medical history, past social history, past surgical history and problem list. Problem list updated.  Objective:   Vitals:   01/09/17 0759  BP: 112/67  Pulse: 72  Weight: 68.4 kg (150 lb 14.4 oz)    Fetal Status: Fetal Heart Rate (bpm): 133   Movement: Present     General:  Alert, oriented and cooperative. Patient is in no acute distress.  Skin: Skin is warm and dry. No rash noted.   Cardiovascular: Normal heart rate noted  Respiratory: Normal respiratory effort, no problems with respiration noted  Abdomen: Soft, gravid, appropriate for gestational age.  Pain/Pressure: Present     Pelvic: Cervical exam deferred        Extremities: Normal range of motion.  Edema: None  Mental Status:  Normal mood and affect. Normal behavior. Normal judgment and thought content.   Assessment and Plan:  Pregnancy: G1P0 at [redacted]w[redacted]d  1. Supervision of normal first pregnancy, antepartum Doing well - Glucose Tolerance, 2 Hours w/1 Hour - CBC - RPR - HIV antibody - Tdap (BOOSTRIX) injection 0.5 mL; Inject 0.5 mLs into the muscle once.  Preterm labor symptoms and general obstetric precautions including but not limited to vaginal bleeding, contractions, leaking of fluid and fetal movement were reviewed in detail with the patient. Please refer to After Visit Summary for other  counseling recommendations.  Return in about 2 weeks (around 01/23/2017).   Scheryl Darter, MD

## 2017-01-09 NOTE — Patient Instructions (Signed)

## 2017-01-09 NOTE — Progress Notes (Signed)
Would like tdap today, flu shot next visit.

## 2017-01-09 NOTE — Discharge Instructions (Signed)

## 2017-01-10 LAB — CBC
HEMATOCRIT: 36.8 % (ref 34.0–46.6)
HEMOGLOBIN: 12 g/dL (ref 11.1–15.9)
MCH: 30.7 pg (ref 26.6–33.0)
MCHC: 32.6 g/dL (ref 31.5–35.7)
MCV: 94 fL (ref 79–97)
Platelets: 251 10*3/uL (ref 150–379)
RBC: 3.91 x10E6/uL (ref 3.77–5.28)
RDW: 14.4 % (ref 12.3–15.4)
WBC: 7.1 10*3/uL (ref 3.4–10.8)

## 2017-01-10 LAB — GLUCOSE TOLERANCE, 2 HOURS W/ 1HR
GLUCOSE, 2 HOUR: 90 mg/dL (ref 65–152)
Glucose, 1 hour: 118 mg/dL (ref 65–179)
Glucose, Fasting: 70 mg/dL (ref 65–91)

## 2017-01-10 LAB — RPR: RPR Ser Ql: NONREACTIVE

## 2017-01-10 LAB — HIV ANTIBODY (ROUTINE TESTING W REFLEX): HIV SCREEN 4TH GENERATION: NONREACTIVE

## 2017-01-23 ENCOUNTER — Ambulatory Visit (INDEPENDENT_AMBULATORY_CARE_PROVIDER_SITE_OTHER): Payer: Medicaid Other | Admitting: Advanced Practice Midwife

## 2017-01-23 VITALS — BP 116/67 | HR 85 | Wt 151.7 lb

## 2017-01-23 DIAGNOSIS — Z3400 Encounter for supervision of normal first pregnancy, unspecified trimester: Secondary | ICD-10-CM

## 2017-01-23 DIAGNOSIS — Z23 Encounter for immunization: Secondary | ICD-10-CM

## 2017-01-23 DIAGNOSIS — Z3403 Encounter for supervision of normal first pregnancy, third trimester: Secondary | ICD-10-CM | POA: Diagnosis present

## 2017-01-23 NOTE — Progress Notes (Signed)
   PRENATAL VISIT NOTE  Subjective:  Lisa Reese is a 26 y.o. G1P0 at [redacted]w[redacted]d being seen today for ongoing prenatal care.  She is currently monitored for the following issues for this low-risk pregnancy and has Supervision of normal first pregnancy, antepartum and Low grade squamous intraepith lesion on cytologic smear cervix (lgsil) on her problem list.  Patient reports no complaints.  Contractions: Irregular. Vag. Bleeding: None.  Movement: Present. Denies leaking of fluid.   The following portions of the patient's history were reviewed and updated as appropriate: allergies, current medications, past family history, past medical history, past social history, past surgical history and problem list. Problem list updated.  Objective:   Vitals:   01/23/17 1432  BP: 116/67  Pulse: 85  Weight: 151 lb 11.2 oz (68.8 kg)    Fetal Status: Fetal Heart Rate (bpm): 146   Movement: Present     General:  Alert, oriented and cooperative. Patient is in no acute distress.  Skin: Skin is warm and dry. No rash noted.   Cardiovascular: Normal heart rate noted  Respiratory: Normal respiratory effort, no problems with respiration noted  Abdomen: Soft, gravid, appropriate for gestational age.  Pain/Pressure: Present     Pelvic: Cervical exam deferred        Extremities: Normal range of motion.  Edema: None  Mental Status:  Normal mood and affect. Normal behavior. Normal judgment and thought content.   Assessment and Plan:  Pregnancy: G1P0 at [redacted]w[redacted]d  1. Supervision of normal first pregnancy, antepartum  - Flu Vaccine QUAD 36+ mos IM  Preterm labor symptoms and general obstetric precautions including but not limited to vaginal bleeding, contractions, leaking of fluid and fetal movement were reviewed in detail with the patient. Please refer to After Visit Summary for other counseling recommendations.  Return in about 2 weeks (around 02/06/2017).   Thressa Sheller, CNM

## 2017-01-23 NOTE — Patient Instructions (Signed)
Third Trimester of Pregnancy The third trimester is from week 28 through week 40 (months 7 through 9). The third trimester is a time when the unborn baby (fetus) is growing rapidly. At the end of the ninth month, the fetus is about 20 inches in length and weighs 6-10 pounds. Body changes during your third trimester Your body will continue to go through many changes during pregnancy. The changes vary from woman to woman. During the third trimester:  Your weight will continue to increase. You can expect to gain 25-35 pounds (11-16 kg) by the end of the pregnancy.  You may begin to get stretch marks on your hips, abdomen, and breasts.  You may urinate more often because the fetus is moving lower into your pelvis and pressing on your bladder.  You may develop or continue to have heartburn. This is caused by increased hormones that slow down muscles in the digestive tract.  You may develop or continue to have constipation because increased hormones slow digestion and cause the muscles that push waste through your intestines to relax.  You may develop hemorrhoids. These are swollen veins (varicose veins) in the rectum that can itch or be painful.  You may develop swollen, bulging veins (varicose veins) in your legs.  You may have increased body aches in the pelvis, back, or thighs. This is due to weight gain and increased hormones that are relaxing your joints.  You may have changes in your hair. These can include thickening of your hair, rapid growth, and changes in texture. Some women also have hair loss during or after pregnancy, or hair that feels dry or thin. Your hair will most likely return to normal after your baby is born.  Your breasts will continue to grow and they will continue to become tender. A yellow fluid (colostrum) may leak from your breasts. This is the first milk you are producing for your baby.  Your belly button may stick out.  You may notice more swelling in your hands,  face, or ankles.  You may have increased tingling or numbness in your hands, arms, and legs. The skin on your belly may also feel numb.  You may feel short of breath because of your expanding uterus.  You may have more problems sleeping. This can be caused by the size of your belly, increased need to urinate, and an increase in your body's metabolism.  You may notice the fetus "dropping," or moving lower in your abdomen (lightening).  You may have increased vaginal discharge.  You may notice your joints feel loose and you may have pain around your pelvic bone.  What to expect at prenatal visits You will have prenatal exams every 2 weeks until week 36. Then you will have weekly prenatal exams. During a routine prenatal visit:  You will be weighed to make sure you and the baby are growing normally.  Your blood pressure will be taken.  Your abdomen will be measured to track your baby's growth.  The fetal heartbeat will be listened to.  Any test results from the previous visit will be discussed.  You may have a cervical check near your due date to see if your cervix has softened or thinned (effaced).  You will be tested for Group B streptococcus. This happens between 35 and 37 weeks.  Your health care provider may ask you:  What your birth plan is.  How you are feeling.  If you are feeling the baby move.  If you have had   any abnormal symptoms, such as leaking fluid, bleeding, severe headaches, or abdominal cramping.  If you are using any tobacco products, including cigarettes, chewing tobacco, and electronic cigarettes.  If you have any questions.  Other tests or screenings that may be performed during your third trimester include:  Blood tests that check for low iron levels (anemia).  Fetal testing to check the health, activity level, and growth of the fetus. Testing is done if you have certain medical conditions or if there are problems during the  pregnancy.  Nonstress test (NST). This test checks the health of your baby to make sure there are no signs of problems, such as the baby not getting enough oxygen. During this test, a belt is placed around your belly. The baby is made to move, and its heart rate is monitored during movement.  What is false labor? False labor is a condition in which you feel small, irregular tightenings of the muscles in the womb (contractions) that usually go away with rest, changing position, or drinking water. These are called Braxton Hicks contractions. Contractions may last for hours, days, or even weeks before true labor sets in. If contractions come at regular intervals, become more frequent, increase in intensity, or become painful, you should see your health care provider. What are the signs of labor?  Abdominal cramps.  Regular contractions that start at 10 minutes apart and become stronger and more frequent with time.  Contractions that start on the top of the uterus and spread down to the lower abdomen and back.  Increased pelvic pressure and dull back pain.  A watery or bloody mucus discharge that comes from the vagina.  Leaking of amniotic fluid. This is also known as your "water breaking." It could be a slow trickle or a gush. Let your health care provider know if it has a color or strange odor. If you have any of these signs, call your health care provider right away, even if it is before your due date. Follow these instructions at home: Medicines  Follow your health care provider's instructions regarding medicine use. Specific medicines may be either safe or unsafe to take during pregnancy.  Take a prenatal vitamin that contains at least 600 micrograms (mcg) of folic acid.  If you develop constipation, try taking a stool softener if your health care provider approves. Eating and drinking  Eat a balanced diet that includes fresh fruits and vegetables, whole grains, good sources of protein  such as meat, eggs, or tofu, and low-fat dairy. Your health care provider will help you determine the amount of weight gain that is right for you.  Avoid raw meat and uncooked cheese. These carry germs that can cause birth defects in the baby.  If you have low calcium intake from food, talk to your health care provider about whether you should take a daily calcium supplement.  Eat four or five small meals rather than three large meals a day.  Limit foods that are high in fat and processed sugars, such as fried and sweet foods.  To prevent constipation: ? Drink enough fluid to keep your urine clear or pale yellow. ? Eat foods that are high in fiber, such as fresh fruits and vegetables, whole grains, and beans. Activity  Exercise only as directed by your health care provider. Most women can continue their usual exercise routine during pregnancy. Try to exercise for 30 minutes at least 5 days a week. Stop exercising if you experience uterine contractions.  Avoid heavy   lifting.  Do not exercise in extreme heat or humidity, or at high altitudes.  Wear low-heel, comfortable shoes.  Practice good posture.  You may continue to have sex unless your health care provider tells you otherwise. Relieving pain and discomfort  Take frequent breaks and rest with your legs elevated if you have leg cramps or low back pain.  Take warm sitz baths to soothe any pain or discomfort caused by hemorrhoids. Use hemorrhoid cream if your health care provider approves.  Wear a good support bra to prevent discomfort from breast tenderness.  If you develop varicose veins: ? Wear support pantyhose or compression stockings as told by your healthcare provider. ? Elevate your feet for 15 minutes, 3-4 times a day. Prenatal care  Write down your questions. Take them to your prenatal visits.  Keep all your prenatal visits as told by your health care provider. This is important. Safety  Wear your seat belt at  all times when driving.  Make a list of emergency phone numbers, including numbers for family, friends, the hospital, and police and fire departments. General instructions  Avoid cat litter boxes and soil used by cats. These carry germs that can cause birth defects in the baby. If you have a cat, ask someone to clean the litter box for you.  Do not travel far distances unless it is absolutely necessary and only with the approval of your health care provider.  Do not use hot tubs, steam rooms, or saunas.  Do not drink alcohol.  Do not use any products that contain nicotine or tobacco, such as cigarettes and e-cigarettes. If you need help quitting, ask your health care provider.  Do not use any medicinal herbs or unprescribed drugs. These chemicals affect the formation and growth of the baby.  Do not douche or use tampons or scented sanitary pads.  Do not cross your legs for long periods of time.  To prepare for the arrival of your baby: ? Take prenatal classes to understand, practice, and ask questions about labor and delivery. ? Make a trial run to the hospital. ? Visit the hospital and tour the maternity area. ? Arrange for maternity or paternity leave through employers. ? Arrange for family and friends to take care of pets while you are in the hospital. ? Purchase a rear-facing car seat and make sure you know how to install it in your car. ? Pack your hospital bag. ? Prepare the baby's nursery. Make sure to remove all pillows and stuffed animals from the baby's crib to prevent suffocation.  Visit your dentist if you have not gone during your pregnancy. Use a soft toothbrush to brush your teeth and be gentle when you floss. Contact a health care provider if:  You are unsure if you are in labor or if your water has broken.  You become dizzy.  You have mild pelvic cramps, pelvic pressure, or nagging pain in your abdominal area.  You have lower back pain.  You have persistent  nausea, vomiting, or diarrhea.  You have an unusual or bad smelling vaginal discharge.  You have pain when you urinate. Get help right away if:  Your water breaks before 37 weeks.  You have regular contractions less than 5 minutes apart before 37 weeks.  You have a fever.  You are leaking fluid from your vagina.  You have spotting or bleeding from your vagina.  You have severe abdominal pain or cramping.  You have rapid weight loss or weight gain.    You have shortness of breath with chest pain.  You notice sudden or extreme swelling of your face, hands, ankles, feet, or legs.  Your baby makes fewer than 10 movements in 2 hours.  You have severe headaches that do not go away when you take medicine.  You have vision changes. Summary  The third trimester is from week 28 through week 40, months 7 through 9. The third trimester is a time when the unborn baby (fetus) is growing rapidly.  During the third trimester, your discomfort may increase as you and your baby continue to gain weight. You may have abdominal, leg, and back pain, sleeping problems, and an increased need to urinate.  During the third trimester your breasts will keep growing and they will continue to become tender. A yellow fluid (colostrum) may leak from your breasts. This is the first milk you are producing for your baby.  False labor is a condition in which you feel small, irregular tightenings of the muscles in the womb (contractions) that eventually go away. These are called Braxton Hicks contractions. Contractions may last for hours, days, or even weeks before true labor sets in.  Signs of labor can include: abdominal cramps; regular contractions that start at 10 minutes apart and become stronger and more frequent with time; watery or bloody mucus discharge that comes from the vagina; increased pelvic pressure and dull back pain; and leaking of amniotic fluid. This information is not intended to replace advice  given to you by your health care provider. Make sure you discuss any questions you have with your health care provider. Document Released: 03/29/2001 Document Revised: 09/10/2015 Document Reviewed: 06/05/2012 Elsevier Interactive Patient Education  2017 [employer]  

## 2017-02-06 ENCOUNTER — Ambulatory Visit (INDEPENDENT_AMBULATORY_CARE_PROVIDER_SITE_OTHER): Payer: Medicaid Other | Admitting: Obstetrics & Gynecology

## 2017-02-06 VITALS — BP 118/65 | HR 72 | Wt 158.0 lb

## 2017-02-06 DIAGNOSIS — Z3403 Encounter for supervision of normal first pregnancy, third trimester: Secondary | ICD-10-CM

## 2017-02-06 DIAGNOSIS — Z3400 Encounter for supervision of normal first pregnancy, unspecified trimester: Secondary | ICD-10-CM

## 2017-02-06 NOTE — Progress Notes (Signed)
   PRENATAL VISIT NOTE  Subjective:  Lisa Reese is a 26 y.o. G1P0 at 2638w5d being seen today for ongoing prenatal care.  She is currently monitored for the following issues for this low-risk pregnancy and has Supervision of normal first pregnancy, antepartum and Low grade squamous intraepith lesion on cytologic smear cervix (lgsil) on her problem list.  Patient reports no complaints.  Contractions: Not present. Vag. Bleeding: None.  Movement: Present. Denies leaking of fluid.   The following portions of the patient's history were reviewed and updated as appropriate: allergies, current medications, past family history, past medical history, past social history, past surgical history and problem list. Problem list updated.  Objective:   Vitals:   02/06/17 1556  BP: 118/65  Pulse: 72  Weight: 158 lb (71.7 kg)    Fetal Status: Fetal Heart Rate (bpm): 134   Movement: Present     General:  Alert, oriented and cooperative. Patient is in no acute distress.  Skin: Skin is warm and dry. No rash noted.   Cardiovascular: Normal heart rate noted  Respiratory: Normal respiratory effort, no problems with respiration noted  Abdomen: Soft, gravid, appropriate for gestational age.  Pain/Pressure: Present     Pelvic: Cervical exam deferred        Extremities: Normal range of motion.  Edema: None  Mental Status:  Normal mood and affect. Normal behavior. Normal judgment and thought content.   Assessment and Plan:  Pregnancy: G1P0 at 9438w5d  1. Supervision of normal first pregnancy, antepartum No issues today.  Pt needs to pick pediatrician.  Preterm labor symptoms and general obstetric precautions including but not limited to vaginal bleeding, contractions, leaking of fluid and fetal movement were reviewed in detail with the patient. Please refer to After Visit Summary for other counseling recommendations.  Return in about 2 weeks (around 02/20/2017).   Elsie LincolnKelly Lelania Bia, MD

## 2017-02-20 ENCOUNTER — Encounter: Payer: Self-pay | Admitting: Advanced Practice Midwife

## 2017-02-20 ENCOUNTER — Ambulatory Visit (INDEPENDENT_AMBULATORY_CARE_PROVIDER_SITE_OTHER): Payer: Medicaid Other | Admitting: Advanced Practice Midwife

## 2017-02-20 VITALS — BP 110/53 | HR 64 | Wt 154.9 lb

## 2017-02-20 DIAGNOSIS — Z3400 Encounter for supervision of normal first pregnancy, unspecified trimester: Secondary | ICD-10-CM

## 2017-02-20 DIAGNOSIS — R059 Cough, unspecified: Secondary | ICD-10-CM

## 2017-02-20 DIAGNOSIS — R05 Cough: Secondary | ICD-10-CM

## 2017-02-20 MED ORDER — GUAIFENESIN 100 MG/5ML PO SOLN: 5.0000 mL | ORAL | 0 refills | Status: DC | PRN

## 2017-02-20 NOTE — Progress Notes (Signed)
   PRENATAL VISIT NOTE  Subjective:  Lisa Reese is a 26 y.o. G1P0 at 8681w5d being seen today for ongoing prenatal care.  She is currently monitored for the following issues for this low-risk pregnancy and has Supervision of normal first pregnancy, antepartum and Low grade squamous intraepith lesion on cytologic smear cervix (lgsil) on their problem list.  Patient reports no complaints.  Contractions: Irritability. Vag. Bleeding: None.  Movement: Present. Denies leaking of fluid.   The following portions of the patient's history were reviewed and updated as appropriate: allergies, current medications, past family history, past medical history, past social history, past surgical history and problem list. Problem list updated.  Objective:   Vitals:   02/20/17 1600  BP: (!) 110/53  Pulse: 64  Weight: 154 lb 14.4 oz (70.3 kg)    Fetal Status: Fetal Heart Rate (bpm): 138 Fundal Height: 34 cm Movement: Present     General:  Alert, oriented and cooperative. Patient is in no acute distress.  Skin: Skin is warm and dry. No rash noted.   Cardiovascular: Normal heart rate noted  Respiratory: Normal respiratory effort, no problems with respiration noted  Abdomen: Soft, gravid, appropriate for gestational age.  Pain/Pressure: Present     Pelvic: Cervical exam deferred        Extremities: Normal range of motion.  Edema: None  Mental Status:  Normal mood and affect. Normal behavior. Normal judgment and thought content.   Assessment and Plan:  Pregnancy: G1P0 at 2081w5d  1. Supervision of normal first pregnancy, antepartum -Routine care -Pediatrician list given -Circumcision information reviewed  2. Cough -Safe medications in pregnancy list given.  - guaiFENesin (ROBITUSSIN) 100 MG/5ML SOLN; Take 5 mLs (100 mg total) every 4 (four) hours as needed by mouth for cough or to loosen phlegm.  Dispense: 1200 mL; Refill: 0  Preterm labor symptoms and general obstetric precautions including but  not limited to vaginal bleeding, contractions, leaking of fluid and fetal movement were reviewed in detail with the patient. Please refer to After Visit Summary for other counseling recommendations.  Return in about 2 weeks (around 03/06/2017).   Rolm BookbinderCaroline M Kien Mirsky, CNM  02/20/17 4:48 PM

## 2017-02-20 NOTE — Patient Instructions (Addendum)
AREA PEDIATRIC/FAMILY [place] [address], [address] [place], [place]  [address] Phone - [phone]   Fax - [phone]  [facility] [place] [address] [place], [place] [address] Phone - [phone]   Fax - [place] [address] [place], [place]  [address] Phone - [phone]   Fax - [phone]  [place] Bel[place]. [address] [place] [address] [place], [place]  [address] Phone - [phone]   Fax - [phone]  [place] [address] [place], [place]  [address] Phone - [phone]   Fax - [phone]  [facility] [address] [place], [place]  [address] Phone - [phone]   Fax - [place] [address] [place], [place]  [address] Phone - [phone]   Fax - [phone]  [place] [address] [place], [place] [address] [place], [place]  [address] Phone - [phone]   Fax - [place] [address] [place], [place]  [address] Phone - [phone]   Fax - [phone] [facility] [place]. [address] [place], [place]  [address] Phone - [phone]   Fax - [phone]  [facility] [place] [address] [place], [place]  [address] Phone - [phone]   Fax - [phone]  [facility] [place], [place], [place]  [address] Phone - [phone]   Fax - [place] [address] [place], [place]  [address] Phone - [phone]   Fax - [phone]  [facility] [address] [place], [place]  [address] Phone - [place], [place]  [address] Phone - [phone]   Fax - [place] [address] [place], [place]  [address] Phone - [phone]   Fax - [phone]  [place] [address] [place], [place]  [address] Phone - [phone]   Fax - [place]. [place], [place]  [address] Phone - [phone]   Fax - [place], [place], [place]  [address] Phone - [phone]   Fax - [place] [address], [place], [place]  [address] Phone - [phone]   Fax - [phone]  [place] RUBIN [address] [place], [place]  [address] Phone - [phone]   Fax - [place] [address] [place], [place]  [address] Phone - [phone]   Fax - [phone]  [place] [address] [place], [place]  [address] Phone - [phone]   Fax - [phone] [place] [address] [place], [place]  [address] Phone - [phone]   Fax - [place] [address] [place], [place]  [address] Phone - [phone]   Fax - [place] [address] [place], [place]  [address] Phone - [phone]   Fax - [phone]  [place] MD [address] [place] [address] Phone [phone]  Fax [phone]  Places to have your son circumcised:    [facility] [phone] while you  are in hospital  [facility] [phone] $244 by 4 wks  [facility] [phone] $175 by 2 wks  [facility] [phone] $[phone] by 7 days [facility] [phone] $[phone] by 4 wks  These prices sometimes change but are roughly what you can expect to pay. Please  call and confirm pricing.   Circumcision is considered an elective/non-medically necessary procedure. There are many reasons parents decide to have their sons circumsized. During the first year of life circumcised males have a reduced risk of urinary tract infections but after this year the rates between circumcised males and uncircumcised males are the same.  It is safe to have your son circumcised outside of the hospital and the places above perform them regularly.    Safe Medications in Pregnancy   Acne: Benzoyl Peroxide Salicylic Acid  Backache/Headache: Tylenol: 2 regular strength every 4 hours OR              2 Extra strength every 6 hours  Colds/Coughs/Allergies: Benadryl (alcohol free) 25 mg every 6 hours as needed Breath right strips Claritin Cepacol throat lozenges Chloraseptic throat spray Cold-Eeze- up to three times per day Cough drops, alcohol free Flonase (by prescription only) Guaifenesin Mucinex Robitussin DM (plain only, alcohol free) Saline nasal spray/drops Sudafed (pseudoephedrine) & Actifed ** use only after [redacted] weeks gestation and if you do not have high blood pressure Tylenol Vicks Vaporub Zinc lozenges Zyrtec   Constipation: Colace Ducolax suppositories Fleet enema Glycerin suppositories Metamucil Milk of magnesia Miralax Senokot Smooth move tea  Diarrhea: Kaopectate Imodium A-D  *NO pepto Bismol  Hemorrhoids: Anusol Anusol HC Preparation H Tucks  Indigestion: Tums Maalox Mylanta Zantac  Pepcid  Insomnia: Benadryl (alcohol free) 25mg  every 6 hours as needed Tylenol PM Unisom, no Gelcaps  Leg Cramps: Tums MagGel  Nausea/Vomiting:  [place] Dramamine Emetrol Ginger extract Sea bands Meclizine  Nausea medication to take during pregnancy:  Unisom (doxylamine succinate 25 mg tablets) Take one tablet daily at bedtime. If symptoms are not adequately controlled, the dose can be increased to a maximum recommended dose of  two tablets daily (1/2 tablet in the morning, 1/2 tablet mid-afternoon and one at bedtime). Vitamin B6 100mg  tablets. Take one tablet twice a day (up to 200 mg per day).  Skin Rashes: Aveeno products Benadryl cream or 25mg  every 6 hours as needed Calamine Lotion 1% cortisone cream  Yeast infection: Gyne-lotrimin 7 Monistat 7   **If taking multiple medications, please check labels to avoid duplicating the same active ingredients **take medication as directed on the label ** Do not exceed 4000 mg of tylenol in 24 hours **Do not take medications that contain aspirin or ibuprofen  AREA PEDIATRIC/FAMILY PRACTICE PHYSICIANS  [facility] [address] [place], [place]  [address] Phone - [phone]   Fax - [phone]  [facility] [place] [address] [place], [place] [address] Phone - [phone]   Fax - [phone]  [place] AM[place] [address] [place], [place]  [address] Phone - [phone]   Fax - [phone]  [facility] [address] [place], [place]  [address] Phone - [phone]   Fax - [phone]  [facility] [address] [place], [place]  [address] Phone - [phone]   Fax - [phone]  [facility] [address] [place], [place]  [address] Phone - [phone]   Fax - [phone]  [facility]  [address] [place], [place]  [address] Phone - [phone]   Fax - [phone]  [facility] [address] [place], [place]  [address] Phone - [phone]   Fax - [phone]  [facility] [address] [place], [place]  [address] Phone - [phone]   Fax - [phone] [facility] [place] [address] [place], [place]  [address] Phone - [phone]   Fax - [phone]  [facility]  [facility] [address] [place], [place]  [address] Phone - [phone]   Fax - [phone]  [facility] [address], [address] [place], [place]  [address] Phone - [phone]   Fax - [phone]  [facility] [place] [address] [place], [place]  [address] Phone - [phone]   Fax - [phone]  [facility] [address], [address] [place], [place]  [address] Phone - [phone]  [facility] [address] [place], [place]  [address] Phone - [phone]   Fax - [phone]  [facility] [address] [place], [place]  [address] Phone - [phone]   Fax - [phone]  [facility] [address] [place], [place]  [address] Phone - [phone]   Fax - [phone]  [facility] [address] [place], [place]  [address] Phone - [phone]   Fax - [phone]   [facility] [address] [place], [place]  [address] Phone - [phone]   Fax - [phone]  [facility] [address] [place], [place]  [address] Phone - [phone]   Fax - [phone]  DAVID RUBIN [address] [place], [place]  [address] Phone - [phone]   Fax - [phone]  [facility] [address] [place], [place]  [address] Phone - [phone]   Fax - [phone]  [facility] [facility] [address] [place], [place]  [address] Phone - [phone]   Fax - [phone] [facility] - [place] [address] [place], [place]  [address] Phone - [phone]   Fax - [phone]  [facility] [place] [address] [place], [place]  [address] Phone - [phone]   Fax - [phone]  [facility] [place] [address] [place], [place]  [address] Phone - [phone]   Fax - [phone]  [facility]  Alleghany Wyvonne Lenzharlene Flemming MD [address] [place] [address] Phone [phone]  Fax [phone]

## 2017-03-07 ENCOUNTER — Ambulatory Visit (INDEPENDENT_AMBULATORY_CARE_PROVIDER_SITE_OTHER): Payer: Medicaid Other | Admitting: Advanced Practice Midwife

## 2017-03-07 ENCOUNTER — Encounter: Payer: Self-pay | Admitting: Advanced Practice Midwife

## 2017-03-07 ENCOUNTER — Other Ambulatory Visit (HOSPITAL_COMMUNITY)
Admission: RE | Admit: 2017-03-07 | Discharge: 2017-03-07 | Disposition: A | Discharge status: Home / Self Care | Payer: Medicaid Other | Source: Ambulatory Visit | Attending: Advanced Practice Midwife | Admitting: Advanced Practice Midwife

## 2017-03-07 VITALS — BP 108/54 | HR 70 | Wt 159.6 lb

## 2017-03-07 DIAGNOSIS — Z3400 Encounter for supervision of normal first pregnancy, unspecified trimester: Secondary | ICD-10-CM

## 2017-03-07 DIAGNOSIS — Z3403 Encounter for supervision of normal first pregnancy, third trimester: Secondary | ICD-10-CM | POA: Insufficient documentation

## 2017-03-07 LAB — OB RESULTS CONSOLE GBS: GBS: NEGATIVE

## 2017-03-07 NOTE — Progress Notes (Signed)
   PRENATAL VISIT NOTE  Subjective:  Lisa Reese is a 26 y.o. G1P0 at 8876w6d being seen today for ongoing prenatal care.  She is currently monitored for the following issues for this low-risk pregnancy and has Supervision of normal first pregnancy, antepartum and Low grade squamous intraepith lesion on cytologic smear cervix (lgsil) on their problem list.  Patient reports no complaints.  Contractions: Not present. Vag. Bleeding: None.  Movement: Present. Denies leaking of fluid.   The following portions of the patient's history were reviewed and updated as appropriate: allergies, current medications, past family history, past medical history, past social history, past surgical history and problem list. Problem list updated.  Objective:   Vitals:   03/07/17 1437  BP: (!) 108/54  Pulse: 70  Weight: 159 lb 9.6 oz (72.4 kg)    Fetal Status: Fetal Heart Rate (bpm): 139 Fundal Height: 36 cm Movement: Present     General:  Alert, oriented and cooperative. Patient is in no acute distress.  Skin: Skin is warm and dry. No rash noted.   Cardiovascular: Normal heart rate noted  Respiratory: Normal respiratory effort, no problems with respiration noted  Abdomen: Soft, gravid, appropriate for gestational age.  Pain/Pressure: Present     Pelvic:   Cervix closed/50%/vtx  Extremities: Normal range of motion.  Edema: None  Mental Status:  Normal mood and affect. Normal behavior. Normal judgment and thought content.   Assessment and Plan:  Pregnancy: G1P0 at 6276w6d  1. Supervision of normal first pregnancy, antepartum     Reviewed labor precautions - Culture, beta strep (group b only) - GC/Chlamydia probe amp ([place])not at [facility]  Preterm labor symptoms and general obstetric precautions including but not limited to vaginal bleeding, contractions, leaking of fluid and fetal movement were reviewed in detail with the patient. Please refer to After Visit Summary for other counseling  recommendations.  RTO 1 week  Wynelle BourgeoisMarie Dearion Huot, CNM

## 2017-03-07 NOTE — Patient Instructions (Signed)

## 2017-03-08 LAB — GC/CHLAMYDIA PROBE AMP (~~LOC~~) NOT AT ARMC
Chlamydia: NEGATIVE
Neisseria Gonorrhea: NEGATIVE

## 2017-03-12 LAB — CULTURE, BETA STREP (GROUP B ONLY): STREP GP B CULTURE: NEGATIVE

## 2017-03-15 ENCOUNTER — Ambulatory Visit (INDEPENDENT_AMBULATORY_CARE_PROVIDER_SITE_OTHER): Payer: Medicaid Other | Admitting: Nurse Practitioner

## 2017-03-15 VITALS — BP 119/69 | HR 93 | Wt 160.8 lb

## 2017-03-15 DIAGNOSIS — Z3403 Encounter for supervision of normal first pregnancy, third trimester: Secondary | ICD-10-CM

## 2017-03-15 DIAGNOSIS — Z3400 Encounter for supervision of normal first pregnancy, unspecified trimester: Secondary | ICD-10-CM

## 2017-03-15 NOTE — Progress Notes (Signed)
    Subjective:  Lisa Reese is a 26 y.o. G1P0 at 8068w0d being seen today for ongoing prenatal care.  She is currently monitored for the following issues for this low-risk pregnancy and has Supervision of normal first pregnancy, antepartum and Low grade squamous intraepith lesion on cytologic smear cervix (lgsil) on their problem list.  Patient reports no complaints.  Contractions: Not present. Vag. Bleeding: None.  Movement: Present. Denies leaking of fluid.   The following portions of the patient's history were reviewed and updated as appropriate: allergies, current medications, past family history, past medical history, past social history, past surgical history and problem list. Problem list updated.  Objective:   Vitals:   03/15/17 1551  BP: 119/69  Pulse: 93  Weight: 160 lb 12.8 oz (72.9 kg)    Fetal Status: Fetal Heart Rate (bpm): 147 Fundal Height: 36 cm Movement: Present     General:  Alert, oriented and cooperative. Patient is in no acute distress.  Skin: Skin is warm and dry. No rash noted.   Cardiovascular: Normal heart rate noted  Respiratory: Normal respiratory effort, no problems with respiration noted  Abdomen: Soft, gravid, appropriate for gestational age. Pain/Pressure: Present     Pelvic:  Cervical exam deferred        Extremities: Normal range of motion.  Edema: None  Mental Status: Normal mood and affect. Normal behavior. Normal judgment and thought content.   Urinalysis:      Assessment and Plan:  Pregnancy: G1P0 at 5568w0d  1. Supervision of normal first pregnancy, antepartum Continues to be unsure about postpartum contraception - advised to wait until the baby is 2818 months of age before attempting to conceive - will need to use condoms with every intercourse if not using some other form of birth control.  Client in agreement.  Term labor symptoms and general obstetric precautions including but not limited to vaginal bleeding, contractions, leaking of  fluid and fetal movement were reviewed in detail with the patient. Please refer to After Visit Summary for other counseling recommendations.  Return in about 1 week (around 03/22/2017).  Nolene BernheimERRI BURLESON, RN, MSN, NP-BC Nurse Practitioner, [facility] Center for [facility], [facility] 03/15/2017 5:35 PM

## 2017-03-15 NOTE — Patient Instructions (Addendum)

## 2017-03-19 ENCOUNTER — Inpatient Hospital Stay (HOSPITAL_COMMUNITY)
Admission: AD | Admit: 2017-03-19 | Discharge: 2017-03-19 | Disposition: A | Discharge status: Home / Self Care | Payer: Medicaid Other | Source: Ambulatory Visit | Attending: Obstetrics and Gynecology | Admitting: Obstetrics and Gynecology

## 2017-03-19 ENCOUNTER — Other Ambulatory Visit: Payer: Self-pay

## 2017-03-19 ENCOUNTER — Inpatient Hospital Stay (HOSPITAL_COMMUNITY)
Admission: AD | Admit: 2017-03-19 | Discharge: 2017-03-19 | Discharge status: Absent without leave | Payer: Medicaid Other | Source: Home / Self Care | Attending: Obstetrics & Gynecology | Admitting: Obstetrics & Gynecology

## 2017-03-19 ENCOUNTER — Encounter (HOSPITAL_COMMUNITY): Payer: Self-pay

## 2017-03-19 DIAGNOSIS — Z79899 Other long term (current) drug therapy: Secondary | ICD-10-CM | POA: Diagnosis not present

## 2017-03-19 DIAGNOSIS — O26893 Other specified pregnancy related conditions, third trimester: Secondary | ICD-10-CM | POA: Diagnosis not present

## 2017-03-19 DIAGNOSIS — O479 False labor, unspecified: Secondary | ICD-10-CM

## 2017-03-19 DIAGNOSIS — N898 Other specified noninflammatory disorders of vagina: Secondary | ICD-10-CM

## 2017-03-19 DIAGNOSIS — O471 False labor at or after 37 completed weeks of gestation: Secondary | ICD-10-CM

## 2017-03-19 DIAGNOSIS — Z3A38 38 weeks gestation of pregnancy: Secondary | ICD-10-CM | POA: Diagnosis not present

## 2017-03-19 LAB — WET PREP, GENITAL
Clue Cells Wet Prep HPF POC: NONE SEEN
SPERM: NONE SEEN
Trich, Wet Prep: NONE SEEN
Yeast Wet Prep HPF POC: NONE SEEN

## 2017-03-19 LAB — POCT FERN TEST
POCT FERN TEST: NEGATIVE
POCT Fern Test: NEGATIVE

## 2017-03-19 NOTE — Discharge Instructions (Signed)

## 2017-03-19 NOTE — MAU Note (Signed)
Contractions got stronger and closer tonight. Baby moving well. No bleeding. Watery leaking since 2 am, clear and mucus sometimes too.

## 2017-03-19 NOTE — MAU Provider Note (Signed)
Chief Complaint:  Contractions   First Provider Initiated Contact with Patient 03/19/17 [phone]      HPI: Lisa Reese is a 26 y.o. G1P0 at 2838w4dwho presents to maternity admissions reporting   Slow trickling and wetness on clothing since 0200 She reports good fetal movement, denies LOF, vaginal bleeding, vaginal itching/burning, urinary symptoms, h/a, dizziness, n/v, or fever/chills.    Vaginal Discharge  The patient's primary symptoms include vaginal discharge. The patient's pertinent negatives include no genital itching, genital lesions, genital odor or vaginal bleeding. This is a new problem. The current episode started today. The problem occurs rarely. The problem has been unchanged. She is pregnant. Associated symptoms include abdominal pain. Pertinent negatives include no dysuria, fever, headaches, nausea or vomiting. The vaginal discharge was mucoid. There has been no bleeding. She has not been passing clots. She has not been passing tissue. Nothing aggravates the symptoms. She has tried nothing for the symptoms.   RN Note:  Contractions got stronger and closer tonight. Baby moving well. No bleeding. Watery leaking since 2 am, clear and mucus sometimes too.   Past Medical History: Past Medical History:  Diagnosis Date  . Hyperemesis gravidarum   . Medical history non-contributory     Past obstetric history: OB History  Gravida Para Term Preterm AB Living  1         0  SAB TAB Ectopic Multiple Live Births               # Outcome Date GA Lbr Len/2nd Weight Sex Delivery Anes PTL Lv  1 Current               Past Surgical History: Past Surgical History:  Procedure Laterality Date  . NO PAST SURGERIES      Family History: History reviewed. No pertinent family history.  Social History: Social History   Tobacco Use  . Smoking status: Never Smoker  . Smokeless tobacco: Never Used  Substance Use Topics  . Alcohol use: No  . Drug use: No    Allergies: No Known  Allergies  Meds:  Medications Prior to Admission  Medication Sig Dispense Refill Last Dose  . omeprazole (PRILOSEC) 20 MG capsule Take 1 capsule (20 mg total) by mouth daily. 30 capsule 6 03/18/2017 at Unknown time  . Prenat w/o A Vit-FeFum-FePo-FA (CONCEPT OB) 130-92.4-1 MG CAPS Take 1 capsule by mouth daily. 30 capsule 11 03/18/2017 at Unknown time  . guaiFENesin (ROBITUSSIN) 100 MG/5ML SOLN Take 5 mLs (100 mg total) every 4 (four) hours as needed by mouth for cough or to loosen phlegm. (Patient not taking: Reported on 03/15/2017) 1200 mL 0 Not Taking    ROS:  Review of Systems  Constitutional: Negative for fever.  Gastrointestinal: Positive for abdominal pain. Negative for nausea and vomiting.  Genitourinary: Positive for vaginal discharge. Negative for dysuria.  Neurological: Negative for headaches.   I have reviewed patient's Past Medical Hx, Surgical Hx, Family Hx, Social Hx, medications and allergies.   Physical Exam   Patient Vitals for the past 24 hrs:  BP Temp Temp src Pulse Resp SpO2 Height Weight  03/19/17 0416 126/64 97.7 F (36.5 C) Oral 85 18 100 % 5\' 2"  (1.575 m) 162 lb (73.5 kg)   Constitutional: Well-developed, well-nourished female in no acute distress.  Cardiovascular: normal rate and rhythm Respiratory: normal effort, no distress GI: Abd soft, non-tender, gravid appropriate for gestational age.  MS: Extremities nontender, no edema, normal ROM Neurologic: Alert and oriented x 4.  GU:  Neg CVAT.  PELVIC EXAM: Yellow mucous in vault.  No pooling, No ferning.  Mucous is quite yellow, so I sent a wet prep also.    Dilation: Fingertip Effacement (%): 80 Cervical Position: Posterior Station: -3 Presentation: Vertex Exam by:: Wynelle BourgeoisMarie Maclovia Uher CNM  FHT:  Baseline 135 , moderate variability, accelerations present, no decelerations Contractions: q 4-5 mins   Labs: O/Positive/-- (05/29 1608) Results for orders placed or performed during the hospital encounter of  03/19/17 (from the past 24 hour(s))  POCT fern test     Status: None   Collection Time: 03/19/17  4:36 AM  Result Value Ref Range   POCT Fern Test Negative = intact amniotic membranes   Wet prep, genital     Status: Abnormal   Collection Time: 03/19/17  4:47 AM  Result Value Ref Range   Yeast Wet Prep HPF POC NONE SEEN NONE SEEN   Trich, Wet Prep NONE SEEN NONE SEEN   Clue Cells Wet Prep HPF POC NONE SEEN NONE SEEN   WBC, Wet Prep HPF POC MODERATE (A) NONE SEEN   Sperm NONE SEEN   Fern Test     Status: None   Collection Time: 03/19/17  4:49 AM  Result Value Ref Range   POCT Fern Test Negative = intact amniotic membranes     Imaging:  No results found.  MAU Course/MDM: I have Done a speculum exam and fern test which was Negative.  Pooling was negative Nitrazine not done.    No evidence for ruptured membranes.   Recheck of cervix  >  unchanged UCs only about 5-6 min  Pt stable at time of discharge.  Assessment: Single IUP at 2471w4d Uterine contractions with no cervical change Vaginal discharge  Plan: Discharge home Labor precautions and fetal kick counts Follow up in clinic as scheduled Encouraged to return here or to other Urgent Care/ED if she develops worsening of symptoms, increase in pain, fever, or other concerning symptoms.    Wynelle BourgeoisMarie Tilla Wilborn CNM, MSN Certified Nurse-Midwife 03/19/2017 4:53 AM

## 2017-03-21 ENCOUNTER — Inpatient Hospital Stay (EMERGENCY_DEPARTMENT_HOSPITAL)
Admission: AD | Admit: 2017-03-21 | Discharge: 2017-03-21 | Disposition: A | Discharge status: Home / Self Care | Payer: Medicaid Other | Source: Ambulatory Visit | Attending: Obstetrics and Gynecology | Admitting: Obstetrics and Gynecology

## 2017-03-21 ENCOUNTER — Encounter (HOSPITAL_COMMUNITY): Payer: Self-pay

## 2017-03-21 DIAGNOSIS — O471 False labor at or after 37 completed weeks of gestation: Secondary | ICD-10-CM | POA: Diagnosis not present

## 2017-03-21 DIAGNOSIS — O4703 False labor before 37 completed weeks of gestation, third trimester: Secondary | ICD-10-CM

## 2017-03-21 DIAGNOSIS — Z3A38 38 weeks gestation of pregnancy: Secondary | ICD-10-CM | POA: Diagnosis not present

## 2017-03-21 DIAGNOSIS — O479 False labor, unspecified: Secondary | ICD-10-CM

## 2017-03-21 DIAGNOSIS — Z0371 Encounter for suspected problem with amniotic cavity and membrane ruled out: Secondary | ICD-10-CM

## 2017-03-21 LAB — POCT FERN TEST: POCT Fern Test: NEGATIVE

## 2017-03-21 NOTE — Discharge Instructions (Signed)

## 2017-03-21 NOTE — MAU Note (Signed)
Pt here with contractions that are worsening. Denies any leaking; having some spotting and mucus. Reports fetal movement.

## 2017-03-21 NOTE — MAU Provider Note (Signed)
S: Ms. Karrie MeresOpeyemi Trinkle is a 26 y.o. G1P0 at 4819w6d  who presents to MAU today complaining of leaking of fluid intermittently since yesterday, 03/20/17, in the afternoon. She denies vaginal bleeding. She endorses contractions. She reports normal fetal movement.    O: BP 126/69   Pulse 87   Resp 18   Ht 5\' 2"  (1.575 m)   Wt 162 lb (73.5 kg)   LMP 06/22/2016 (Exact Date)   SpO2 100%   BMI 29.63 kg/m  GENERAL: Well-developed, well-nourished female in no acute distress.  HEAD: Normocephalic, atraumatic.  CHEST: Normal effort of breathing, regular heart rate ABDOMEN: Soft, nontender, gravid PELVIC: Normal external female genitalia. Vagina is pink and rugated. Cervix with normal contour, no lesions. Normal discharge.  negative pooling.   Cervical exam:  Dilation: 1.5 Effacement (%): 80 Station: -3 Presentation: Vertex Exam by:: PharmacologistAmber Stovall RN   Fetal Monitoring: Baseline: 135 Variability: moderate Accelerations: present Decelerations:none Contractions: 2-4 minutes, mild to moderate to palpation  Results for orders placed or performed during the hospital encounter of 03/21/17 (from the past 24 hour(s))  POCT fern test     Status: None   Collection Time: 03/21/17  3:30 AM  Result Value Ref Range   POCT Fern Test Negative = intact amniotic membranes     MDM:  Offered to recheck pt cervix in 1 hour but pt and family members prefer to go home since pt not in active labor at 1.5 cm.  Discussed maternal positions to improve fetal position, encourage active labor, including the [employer].  Also recommend rest, increased PO fluids. Labor precautions/reasons to return reviewed.  A: SIUP at 4819w6d  Membranes intact  P: 1. False labor   2. Encounter for suspected PROM, with rupture of membranes not found      Hurshel PartyLeftwich-Kirby, Shaddai Shapley A, CNM 03/21/2017 3:44 AM

## 2017-03-22 ENCOUNTER — Encounter (HOSPITAL_COMMUNITY): Payer: Self-pay | Admitting: Anesthesiology

## 2017-03-22 ENCOUNTER — Encounter (HOSPITAL_COMMUNITY): Payer: Self-pay | Admitting: *Deleted

## 2017-03-22 ENCOUNTER — Encounter: Payer: Medicaid Other | Admitting: Medical

## 2017-03-22 ENCOUNTER — Inpatient Hospital Stay (HOSPITAL_COMMUNITY)
Admission: AD | Admit: 2017-03-22 | Discharge: 2017-03-24 | DRG: 768 | Disposition: A | Discharge status: Home / Self Care | Payer: Medicaid Other | Source: Ambulatory Visit | Attending: Family Medicine | Admitting: Family Medicine

## 2017-03-22 DIAGNOSIS — Z3A39 39 weeks gestation of pregnancy: Secondary | ICD-10-CM

## 2017-03-22 DIAGNOSIS — Z3400 Encounter for supervision of normal first pregnancy, unspecified trimester: Secondary | ICD-10-CM

## 2017-03-22 DIAGNOSIS — O9989 Other specified diseases and conditions complicating pregnancy, childbirth and the puerperium: Principal | ICD-10-CM | POA: Diagnosis present

## 2017-03-22 DIAGNOSIS — R87612 Low grade squamous intraepithelial lesion on cytologic smear of cervix (LGSIL): Secondary | ICD-10-CM | POA: Diagnosis present

## 2017-03-22 DIAGNOSIS — Z3483 Encounter for supervision of other normal pregnancy, third trimester: Secondary | ICD-10-CM | POA: Diagnosis present

## 2017-03-22 LAB — TYPE AND SCREEN
ABO/RH(D): O POS
Antibody Screen: NEGATIVE

## 2017-03-22 LAB — CBC
HEMATOCRIT: 38 % (ref 36.0–46.0)
HEMOGLOBIN: 12.5 g/dL (ref 12.0–15.0)
MCH: 31.3 pg (ref 26.0–34.0)
MCHC: 32.9 g/dL (ref 30.0–36.0)
MCV: 95 fL (ref 78.0–100.0)
Platelets: 197 10*3/uL (ref 150–400)
RBC: 4 MIL/uL (ref 3.87–5.11)
RDW: 13.8 % (ref 11.5–15.5)
WBC: 17.8 10*3/uL — ABNORMAL HIGH (ref 4.0–10.5)

## 2017-03-22 LAB — ABO/RH: ABO/RH(D): O POS

## 2017-03-22 MED ORDER — TETANUS-DIPHTH-ACELL PERTUSSIS 5-2.5-18.5 LF-MCG/0.5 IM SUSP: 0.5000 mL | Freq: Once | INTRAMUSCULAR | Status: DC

## 2017-03-22 MED ORDER — EPHEDRINE 5 MG/ML INJ
10.0000 mg | INTRAVENOUS | Status: DC | PRN
  Filled 2017-03-22: qty 2

## 2017-03-22 MED ORDER — METHYLERGONOVINE MALEATE 0.2 MG/ML IJ SOLN
INTRAMUSCULAR | Status: AC
  Administered 2017-03-22: 0.2 mg
  Filled 2017-03-22: qty 1

## 2017-03-22 MED ORDER — DOCUSATE SODIUM 100 MG PO CAPS
100.0000 mg | ORAL_CAPSULE | Freq: Two times a day (BID) | ORAL | Status: DC
  Administered 2017-03-22 – 2017-03-24 (×4): 100 mg via ORAL
  Filled 2017-03-22 (×4): qty 1

## 2017-03-22 MED ORDER — SIMETHICONE 80 MG PO CHEW: 80.0000 mg | CHEWABLE_TABLET | ORAL | Status: DC | PRN

## 2017-03-22 MED ORDER — LACTATED RINGERS IV SOLN
500.0000 mL | INTRAVENOUS | Status: DC | PRN
  Administered 2017-03-22: 1000 mL via INTRAVENOUS

## 2017-03-22 MED ORDER — WITCH HAZEL-GLYCERIN EX PADS
1.0000 | MEDICATED_PAD | CUTANEOUS | Status: DC | PRN
Start: 2017-03-22 — End: 2017-03-24

## 2017-03-22 MED ORDER — MISOPROSTOL 200 MCG PO TABS
800.0000 ug | ORAL_TABLET | Freq: Once | ORAL | Status: AC
  Administered 2017-03-22: 800 ug via ORAL

## 2017-03-22 MED ORDER — OXYCODONE-ACETAMINOPHEN 5-325 MG PO TABS: 2.0000 | ORAL_TABLET | ORAL | Status: DC | PRN

## 2017-03-22 MED ORDER — LACTATED RINGERS IV SOLN: INTRAVENOUS | Status: DC

## 2017-03-22 MED ORDER — BENZOCAINE-MENTHOL 20-0.5 % EX AERO
1.0000 | INHALATION_SPRAY | CUTANEOUS | Status: DC | PRN
Start: 2017-03-22 — End: 2017-03-24
  Administered 2017-03-22: 1 via TOPICAL
  Filled 2017-03-22 (×2): qty 56

## 2017-03-22 MED ORDER — IBUPROFEN 600 MG PO TABS
600.0000 mg | ORAL_TABLET | Freq: Four times a day (QID) | ORAL | Status: DC
  Administered 2017-03-22 – 2017-03-24 (×8): 600 mg via ORAL
  Filled 2017-03-22 (×8): qty 1

## 2017-03-22 MED ORDER — ZOLPIDEM TARTRATE 5 MG PO TABS: 5.0000 mg | ORAL_TABLET | Freq: Every evening | ORAL | Status: DC | PRN

## 2017-03-22 MED ORDER — PRENATAL MULTIVITAMIN CH
1.0000 | ORAL_TABLET | Freq: Every day | ORAL | Status: DC
  Administered 2017-03-23 – 2017-03-24 (×2): 1 via ORAL
  Filled 2017-03-22 (×2): qty 1

## 2017-03-22 MED ORDER — OXYCODONE-ACETAMINOPHEN 5-325 MG PO TABS: 1.0000 | ORAL_TABLET | ORAL | Status: DC | PRN

## 2017-03-22 MED ORDER — ACETAMINOPHEN 325 MG PO TABS: 650.0000 mg | ORAL_TABLET | ORAL | Status: DC | PRN

## 2017-03-22 MED ORDER — ACETAMINOPHEN 325 MG PO TABS
650.0000 mg | ORAL_TABLET | ORAL | Status: DC | PRN
  Administered 2017-03-23: 650 mg via ORAL
  Filled 2017-03-22: qty 2

## 2017-03-22 MED ORDER — MISOPROSTOL 200 MCG PO TABS
ORAL_TABLET | ORAL | Status: AC
  Filled 2017-03-22: qty 4

## 2017-03-22 MED ORDER — SOD CITRATE-CITRIC ACID 500-334 MG/5ML PO SOLN: 30.0000 mL | ORAL | Status: DC | PRN

## 2017-03-22 MED ORDER — OXYTOCIN 40 UNITS IN LACTATED RINGERS INFUSION - SIMPLE MED
2.5000 [IU]/h | INTRAVENOUS | Status: DC
  Filled 2017-03-22: qty 1000

## 2017-03-22 MED ORDER — ONDANSETRON HCL 4 MG/2ML IJ SOLN: 4.0000 mg | INTRAMUSCULAR | Status: DC | PRN

## 2017-03-22 MED ORDER — LACTATED RINGERS IV SOLN: 500.0000 mL | Freq: Once | INTRAVENOUS | Status: DC

## 2017-03-22 MED ORDER — FLEET ENEMA 7-19 GM/118ML RE ENEM: 1.0000 | ENEMA | RECTAL | Status: DC | PRN

## 2017-03-22 MED ORDER — DIBUCAINE 1 % RE OINT
1.0000 | TOPICAL_OINTMENT | RECTAL | Status: DC | PRN
Start: 2017-03-22 — End: 2017-03-24

## 2017-03-22 MED ORDER — PHENYLEPHRINE 40 MCG/ML (10ML) SYRINGE FOR IV PUSH (FOR BLOOD PRESSURE SUPPORT)
80.0000 ug | PREFILLED_SYRINGE | INTRAVENOUS | Status: DC | PRN
  Filled 2017-03-22: qty 5

## 2017-03-22 MED ORDER — COCONUT OIL OIL
1.0000 "application " | TOPICAL_OIL | Status: DC | PRN
  Administered 2017-03-23: 1 via TOPICAL
  Filled 2017-03-22: qty 120

## 2017-03-22 MED ORDER — LIDOCAINE HCL (PF) 1 % IJ SOLN
30.0000 mL | INTRAMUSCULAR | Status: AC | PRN
  Administered 2017-03-22: 30 mL via SUBCUTANEOUS
  Filled 2017-03-22: qty 30

## 2017-03-22 MED ORDER — SENNOSIDES-DOCUSATE SODIUM 8.6-50 MG PO TABS
2.0000 | ORAL_TABLET | ORAL | Status: DC
  Administered 2017-03-22 – 2017-03-23 (×2): 2 via ORAL
  Filled 2017-03-22 (×2): qty 2

## 2017-03-22 MED ORDER — FENTANYL CITRATE (PF) 100 MCG/2ML IJ SOLN
INTRAMUSCULAR | Status: AC
Start: 2017-03-22 — End: 2017-03-23
  Filled 2017-03-22: qty 2

## 2017-03-22 MED ORDER — ONDANSETRON HCL 4 MG/2ML IJ SOLN: 4.0000 mg | Freq: Four times a day (QID) | INTRAMUSCULAR | Status: DC | PRN

## 2017-03-22 MED ORDER — FENTANYL 2.5 MCG/ML BUPIVACAINE 1/10 % EPIDURAL INFUSION (WH - ANES): 14.0000 mL/h | INTRAMUSCULAR | Status: DC | PRN

## 2017-03-22 MED ORDER — DIPHENHYDRAMINE HCL 50 MG/ML IJ SOLN: 12.5000 mg | INTRAMUSCULAR | Status: DC | PRN

## 2017-03-22 MED ORDER — ONDANSETRON HCL 4 MG PO TABS: 4.0000 mg | ORAL_TABLET | ORAL | Status: DC | PRN

## 2017-03-22 MED ORDER — DIPHENHYDRAMINE HCL 25 MG PO CAPS: 25.0000 mg | ORAL_CAPSULE | Freq: Four times a day (QID) | ORAL | Status: DC | PRN

## 2017-03-22 MED ORDER — OXYTOCIN BOLUS FROM INFUSION
500.0000 mL | Freq: Once | INTRAVENOUS | Status: AC
  Administered 2017-03-22: 500 mL via INTRAVENOUS

## 2017-03-22 MED ORDER — FENTANYL CITRATE (PF) 100 MCG/2ML IJ SOLN
100.0000 ug | Freq: Once | INTRAMUSCULAR | Status: AC
  Administered 2017-03-22: 100 ug via INTRAVENOUS

## 2017-03-22 NOTE — MAU Note (Signed)
Pt presents EMS with complaints of SROM at 0845 with contractions starting shortly after.

## 2017-03-22 NOTE — H&P (Signed)
OBSTETRIC ADMISSION HISTORY AND PHYSICAL  Lisa Reese is a 26 y.o. female G1P0 with IUP at 5644w0d by LMP presenting for SROM and spontaneous onset of labor. She states her water broke at 0845 with green fluid and she started contracting immediately. She arrived to MAU via EMS and was 8cm. She reports +FMs, no VB, no blurry vision, headaches or peripheral edema, and RUQ pain.  She plans on breast feeding. She is unsure what she desires for birth control. [facility]   Dating: By LMP --->  Estimated Date of Delivery: 03/29/17   [facility]  Prenatal Labs  Dating   LMP c/w 9 wk US Blood type: O/Positive/-- (05/29 1608)   Genetic Screen 1 Screen: NEG    AFP: Not done Antibody:Negative (05/29 1608)  Anatomic US  wnl Rubella: 9.08 (05/29 1608)  GTT Third trimester: nl 2 hr RPR: Non Reactive (05/29 1608)   Flu vaccine 01/23/17 HBsAg: Negative (05/29 1608)   TDaP vaccine    01/09/17                                        HIV: Non Reactive (05/29 1608)   Baby Food    Breastfeeding                                           GBS: (For PCN allergy, check sensitivities)  Contraception  unsure Pap:  Performed 5/29; needs repeat pap in 2019  Circumcision Yes, circ info given    Pediatrician  has list    Support Person  husband Niyi   Prenatal Classes     Prenatal History/Complications:  Past Medical History: Past Medical History:  Diagnosis Date  . Hyperemesis gravidarum   . Medical history non-contributory     Past Surgical History: Past Surgical History:  Procedure Laterality Date  . NO PAST SURGERIES      Obstetrical History: OB History    Gravida Para Term Preterm AB Living   1         0   SAB TAB Ectopic Multiple Live Births                  Social History: Social History   Socioeconomic History  . Marital status: Married    Spouse name: None  . Number of children: None  . Years of education: None  . Highest education level: None  Social  Needs  . Financial resource strain: None  . Food insecurity - worry: None  . Food insecurity - inability: None  . Transportation needs - medical: None  . Transportation needs - non-medical: None  Occupational History  . None  Tobacco Use  . Smoking status: Never Smoker  . Smokeless tobacco: Never Used  Substance and Sexual Activity  . Alcohol use: No  . Drug use: No  . Sexual activity: Yes    Birth control/protection: None  Other Topics Concern  . None  Social History Narrative  . None    Family History: History reviewed. No pertinent family history.  Allergies: No Known Allergies  Medications Prior to Admission  Medication Sig Dispense Refill Last Dose  . omeprazole (PRILOSEC) 20 MG capsule Take 1 capsule (20 mg total) by mouth daily. 30 capsule 6 03/21/2017 at  Unknown time  . Prenat w/o A Vit-FeFum-FePo-FA (CONCEPT OB) 130-92.4-1 MG CAPS Take 1 capsule by mouth daily. 30 capsule 11 03/21/2017 at Unknown time     Review of Systems   All systems reviewed and negative except as stated in HPI  Blood pressure (!) 128/99, pulse (!) 144, temperature (!) 97.4 F (36.3 C), temperature source Oral, resp. rate 20, height 5\' 2"  (1.575 m), weight 163 lb (73.9 kg), last menstrual period 06/22/2016, SpO2 100 %. General appearance: alert, cooperative and no distress Lungs: clear to auscultation bilaterally Heart: regular rate and rhythm Abdomen: soft, non-tender; bowel sounds normal Pelvic: n/a Extremities: Homans sign is negative, no sign of DVT DTR's +2 Presentation: cephalic Fetal monitoringBaseline: 120 bpm, Variability: Good {> 6 bpm), Accelerations: Reactive and Decelerations: Absent Uterine activityFrequency: Every 2-3 minutes Dilation: (SVD of viable female ) Effacement (%): 100 Station: Crowning Exam by:: h koran rnc  Cervix on arrival: 8cm  Prenatal labs: ABO, Rh: --/--/O POS, O POS (12/05 0950) Antibody: NEG (12/05 0950) Rubella: 9.08 (05/29 1608) RPR: Non  Reactive (09/24 0949)  HBsAg: Negative (05/29 1608)  HIV:    GBS: Negative (11/20 0000)   Prenatal Transfer Tool  Maternal Diabetes: No Genetic Screening: Normal Maternal Ultrasounds/Referrals: Normal Fetal Ultrasounds or other Referrals:  None Maternal Substance Abuse:  No Significant Maternal Medications:  None Significant Maternal Lab Results: None  Results for orders placed or performed during the hospital encounter of 03/22/17 (from the past 24 hour(s))  CBC   Collection Time: 03/22/17  9:49 AM  Result Value Ref Range   WBC 17.8 (H) 4.0 - 10.5 K/uL   RBC 4.00 3.87 - 5.11 MIL/uL   Hemoglobin 12.5 12.0 - 15.0 g/dL   HCT 40.938.0 81.136.0 - 91.446.0 %   MCV 95.0 78.0 - 100.0 fL   MCH 31.3 26.0 - 34.0 pg   MCHC 32.9 30.0 - 36.0 g/dL   RDW 78.213.8 95.611.5 - 21.315.5 %   Platelets 197 150 - 400 K/uL  Type and screen [facility]    Collection Time: 03/22/17  9:50 AM  Result Value Ref Range   ABO/RH(D) O POS    Antibody Screen NEG    Sample Expiration 03/25/2017   ABO/Rh   Collection Time: 03/22/17  9:50 AM  Result Value Ref Range   ABO/RH(D) O POS     Patient Active Problem List   Diagnosis Date Noted  . Normal labor and delivery 03/22/2017  . Low grade squamous intraepith lesion on cytologic smear cervix (lgsil) 10/12/2016  . Supervision of normal first pregnancy, antepartum 09/13/2016    Assessment/Plan:  Batul Desai is a 26 y.o. G1P0 at 3747w0d here for SROM/SOL  #Labor: Expectant management #Pain: Patient does not desire anything for pain #FWB: Cat 1 #ID:  GBS neg #MOF: Breast #MOC: unsure #Circ:  no  Rolm BookbinderCaroline M Tehran Rabenold, CNM  03/22/2017, 1:15 PM

## 2017-03-22 NOTE — Lactation Note (Signed)
This note was copied from a baby's chart. Lactation Consultation Note  LC was requested in room, when  LC  entered room P1 mom had infant wrapped in blankets and asleep.  Mom states that the baby was crying and needed food and that someone had brought the infant food just now.  LC asked mom if she desired to breastfeed the infant.  Mom states she wants to provide BM and formula for her infant.  LC asked mom if she had been taught hand expression and mom said yes and that a little colostrum was seen.  LC reviewed with mom and dad infant stomach size, breastfeeding 8-12 times in a 24 hour period, and on demand feeding with cues.  LC encouraged mom to bf first and to burp infant after feeds then switch breast.  BF basics reviewed.  Supply and demand explained to parents as well as the importance of STS and hand expression.  LC reviewed with mom the benefits of breastmilk and provided mom with Lactation brochure and resource sheet and encouraged mom to call for any questions or concerns after DC.   LC encouraged mom to call out for staff assistance if needed with next feed.    Patient Name: Lisa Karrie MeresOpeyemi Pflum WUJWJ'XToday's Date: 03/22/2017 Reason for consult: Initial assessment   Maternal Data Formula Feeding for Exclusion: No Has patient been taught Hand Expression?: (pt said she had by previous RN) Does the patient have breastfeeding experience prior to this delivery?: No  Feeding Feeding Type: Bottle Fed - Formula Nipple Type: Slow - flow Length of feed: 5 min  LATCH Score Latch: Grasps breast easily, tongue down, lips flanged, rhythmical sucking.  Audible Swallowing: A few with stimulation  Type of Nipple: Everted at rest and after stimulation  Comfort (Breast/Nipple): Soft / non-tender  Hold (Positioning): Full assist, staff holds infant at breast  LATCH Score: 7  Interventions    Lactation Tools Discussed/Used     Consult Status Date: 03/23/17 Follow-up type:  In-patient    Lisa Reese 03/22/2017, 7:12 PM

## 2017-03-23 LAB — CBC
HEMATOCRIT: 32.2 % — AB (ref 36.0–46.0)
HEMOGLOBIN: 11 g/dL — AB (ref 12.0–15.0)
MCH: 32.3 pg (ref 26.0–34.0)
MCHC: 34.2 g/dL (ref 30.0–36.0)
MCV: 94.4 fL (ref 78.0–100.0)
Platelets: 198 10*3/uL (ref 150–400)
RBC: 3.41 MIL/uL — ABNORMAL LOW (ref 3.87–5.11)
RDW: 14 % (ref 11.5–15.5)
WBC: 14.3 10*3/uL — AB (ref 4.0–10.5)

## 2017-03-23 LAB — SYPHILIS: RPR W/REFLEX TO RPR TITER AND TREPONEMAL ANTIBODIES, TRADITIONAL SCREENING AND DIAGNOSIS ALGORITHM: RPR Ser Ql: NONREACTIVE

## 2017-03-23 MED ORDER — POLYETHYLENE GLYCOL 3350 17 G PO PACK
17.0000 g | PACK | Freq: Every day | ORAL | Status: DC | PRN
  Filled 2017-03-23: qty 1

## 2017-03-23 NOTE — Progress Notes (Signed)
POSTPARTUM PROGRESS NOTE  Post Partum Day 1 Subjective:  Lisa Reese is a 26 y.o. G1P1001 7382w0d s/p VSD during which she sustained a third degree laceration.  No acute events overnight.  Pt denies problems with ambulating, voiding or po intake.  She reports vomiting a small amount immediately after being transferred, but has not experienced nausea or vomiting since.  Pain is well controlled.  She has had flatus. She has not had bowel movement.  Lochia Moderate.   Objective: Blood pressure (!) 111/48, pulse 65, temperature (!) 97.5 F (36.4 C), temperature source Axillary, resp. rate 18, height 5\' 2"  (1.575 m), weight 65.2 kg (143 lb 11.2 oz), last menstrual period 06/22/2016, SpO2 100 %, unknown if currently breastfeeding.  Physical Exam:  General: alert, cooperative and no distress Lochia:normal flow Chest: no respiratory distress Heart:regular rate, distal pulses intact Abdomen: soft, nontender Uterine Fundus: firm, appropriately tender DVT Evaluation: No calf swelling or tenderness Extremities: No edema  Recent Labs    03/22/17 0949 03/23/17 0529  HGB 12.5 11.0*  HCT 38.0 32.2*    Assessment/Plan:  ASSESSMENT: Lisa Reese is a 26 y.o. G1P1001 3782w0d s/p SVD.  Plan for discharge tomorrow with referral for pelvic floor PT. She plans to use condoms for contraception.   LOS: 1 day   Francie MassingMelissa  Brown MS3 03/23/2017, 9:33 AM   CNM attestation Post Partum Day #1 I have seen and examined this patient and agree with above documentation in the med student's note.   Lisa Reese is a 26 y.o. G1P1001 s/p SVD.  Pt denies problems with ambulating, voiding or po intake. Pain is well controlled.  Plan for birth control is condoms.  Method of Feeding: breast  PE:  BP (!) 111/48 (BP Location: Right Arm)   Pulse 65   Temp (!) 97.5 F (36.4 C) (Axillary)   Resp 18   Ht 5\' 2"  (1.575 m)   Wt 65.2 kg (143 lb 11.2 oz)   LMP 06/22/2016 (Exact Date)   SpO2 100%    Breastfeeding? Unknown   BMI 26.28 kg/m  Fundus firm  Plan for discharge: 03/24/17 Stool softener qd; miralax prn; rec sitz bath  Cam HaiSHAW, Bhavika Schnider, CNM 10:07 AM 03/23/2017

## 2017-03-24 MED ORDER — IBUPROFEN 600 MG PO TABS: 600.0000 mg | ORAL_TABLET | Freq: Four times a day (QID) | ORAL | 0 refills | Status: DC

## 2017-03-24 NOTE — Discharge Summary (Signed)
OB Discharge Summary     Patient Name: Lisa MeresOpeyemi Reagle DOB: 11/12/1990 MRN: [id number]  Date of admission: 03/22/2017 Delivering MD: Rolm BookbinderNEILL, CAROLINE M   Date of discharge: 03/24/2017  Admitting diagnosis: TERM,ROM,CTX Intrauterine pregnancy: 249w0d     Secondary diagnosis:  Active Problems:   Supervision of normal first pregnancy, antepartum   Low grade squamous intraepith lesion on cytologic smear cervix (lgsil)   Normal labor and delivery   Obstetrical laceration, third degree  Additional problems:      Discharge diagnosis: Term Pregnancy Delivered                                                                                                Post partum procedures:n/a  Augmentation: none   Complications: None  Hospital course:  Onset of Labor With Vaginal Delivery     26 y.o. yo G1P1001 at 1649w0d was admitted in Active Labor on 03/22/2017. Patient had an uncomplicated labor course as follows:  Membrane Rupture Time/Date: 8:45 AM ,03/22/2017   Intrapartum Procedures: Episiotomy: None [1]                                         Lacerations:  3rd degree [4]  Patient had a delivery of a Viable infant. 03/22/2017  Information for the patient's newborn:  Gerilyn Nestledegboyega, Boy Sabriel [[id number]][[id number]]       Pateint had an uncomplicated postpartum course.  She is ambulating, tolerating a regular diet, passing flatus, and urinating well. Patient is discharged home in stable condition on 03/24/17.   Physical exam  Vitals:   03/22/17 1930 03/23/17 0500 03/23/17 1822 03/24/17 0547  BP: 123/60 (!) 111/48 (!) 113/55 114/61  Pulse: 97 65 70 76  Resp: 18 18 16 18   Temp: 98.2 F (36.8 C) (!) 97.5 F (36.4 C) (!) 97.4 F (36.3 C) 97.7 F (36.5 C)  TempSrc: Oral Axillary Oral Oral  SpO2: 100% 100%    Weight:  65.2 kg (143 lb 11.2 oz)    Height:       General: alert, cooperative and no distress Lochia: appropriate Uterine Fundus: firm Incision: N/A DVT Evaluation: No evidence of DVT  seen on physical exam. Labs: Lab Results  Component Value Date   WBC 14.3 (H) 03/23/2017   HGB 11.0 (L) 03/23/2017   HCT 32.2 (L) 03/23/2017   MCV 94.4 03/23/2017   PLT 198 03/23/2017   CMP Latest Ref Rng & Units 09/26/2016  Glucose 65 - 99 mg/dL 90  BUN 6 - 20 mg/dL 7  Creatinine 5.780.44 - 4.691.00 mg/dL 6.290.73  Sodium 528135 - 413145 mmol/L 137  Potassium 3.5 - 5.1 mmol/L 3.7  Chloride 101 - 111 mmol/L 104  CO2 22 - 32 mmol/L 21(L)  Calcium 8.9 - 10.3 mg/dL 24.410.1  Total Protein 6.5 - 8.1 g/dL 0.1(U8.4(H)  Total Bilirubin 0.3 - 1.2 mg/dL 0.6  Alkaline Phos 38 - 126 U/L 39  AST 15 - 41 U/L 26  ALT 14 - 54 U/L 16  Discharge instruction: per After Visit Summary and "Baby and Me Booklet".  After visit meds:  Allergies as of 03/24/2017   No Known Allergies     Medication List    STOP taking these medications   omeprazole 20 MG capsule Commonly known as:  PRILOSEC     TAKE these medications   CONCEPT OB 130-92.4-1 MG Caps Take 1 capsule by mouth daily.   ibuprofen 600 MG tablet Commonly known as:  ADVIL,MOTRIN Take 1 tablet (600 mg total) by mouth every 6 (six) hours.       Diet: routine diet  Activity: Advance as tolerated. Pelvic rest for 6 weeks.   Outpatient follow up:6 weeks Follow up Appt:No future appointments. Follow up Visit:No Follow-up on file.  Postpartum contraception: Undecided  Newborn Data: Live born female  Birth Weight: 7 lb 5.3 oz (3325 g) APGAR: 8, 9  Newborn Delivery   Birth date/time:  03/22/2017 12:26:00 Delivery type:  Vaginal, Spontaneous     Baby Feeding: Breast Disposition:home with mother   03/24/2017 Greig RightRESENZO-DISHMAN,Edouard Gikas, CNM

## 2017-03-24 NOTE — Lactation Note (Signed)
This note was copied from a baby's chart. Lactation Consultation Note  Patient Name: Lisa Reese ZOXWR'UToday's Date: 03/24/2017 Reason for consult: Follow-up assessment  Baby 46 hours old. Mom had baby latched when this LC entered the room, but baby hanging from the breast. Assisted mom to lift baby the support baby's head, and baby able to maintain a deep latch and suckle with lips flanged and intermittent swallows noted. Mom aware of OP/BFSG and LC phone line assistance after D/C.   Maternal Data    Feeding Feeding Type: Breast Fed Nipple Type: Slow - flow  LATCH Score Latch: Grasps breast easily, tongue down, lips flanged, rhythmical sucking.  Audible Swallowing: Spontaneous and intermittent  Type of Nipple: Everted at rest and after stimulation  Comfort (Breast/Nipple): Filling, red/small blisters or bruises, mild/mod discomfort  Hold (Positioning): Assistance needed to correctly position infant at breast and maintain latch.  LATCH Score: 8  Interventions Interventions: Breast feeding basics reviewed;Adjust position;Expressed milk  Lactation Tools Discussed/Used     Consult Status Consult Status: PRN    Sherlyn HayJennifer D Georga Stys 03/24/2017, 11:08 AM

## 2017-03-24 NOTE — Lactation Note (Signed)
This note was copied from a baby's chart. Lactation Consultation Note  Patient Name: Boy Karrie MeresOpeyemi Bigbee ZOXWR'UToday's Date: 03/24/2017 Reason for consult: Follow-up assessment  Baby 45 hours old. Mom reports that baby has been having some short feeds, and then at other times baby nurses 20-25 minutes. Otherwise, baby latches and nurses well and mom is hearing swallows while baby at the breast. Enc mom to call for assistance with latch when baby cueing to nurse. Mom has this LC's extension as needed.   Maternal Data    Feeding Feeding Type: Bottle Fed - Breast Milk Nipple Type: Slow - flow  LATCH Score                   Interventions Interventions: Hand pump;Ice(left breast engorged ice and hand pump given)  Lactation Tools Discussed/Used     Consult Status Consult Status: PRN    Sherlyn HayJennifer D Tarhonda Hollenberg 03/24/2017, 10:23 AM

## 2017-03-24 NOTE — Discharge Instructions (Signed)
You can take Robitussin for cough, saline nose spray for stuffiness   Postpartum Care After Vaginal Delivery The period of time right after you deliver your newborn is called the postpartum period. What kind of medical care will I receive?  You may continue to receive fluids and medicines through an IV tube inserted into one of your veins.  If an incision was made near your vagina (episiotomy) or if you had some vaginal tearing during delivery, cold compresses may be placed on your episiotomy or your tear. This helps to reduce pain and swelling.  You may be given a squirt bottle to use when you go to the bathroom. You may use this until you are comfortable wiping as usual. To use the squirt bottle, follow these steps: ? Before you urinate, fill the squirt bottle with warm water. Do not use hot water. ? After you urinate, while you are sitting on the toilet, use the squirt bottle to rinse the area around your urethra and vaginal opening. This rinses away any urine and blood. ? You may do this instead of wiping. As you start healing, you may use the squirt bottle before wiping yourself. Make sure to wipe gently. ? Fill the squirt bottle with clean water every time you use the bathroom.  You will be given sanitary pads to wear. How can I expect to feel?  You may not feel the need to urinate for several hours after delivery.  You will have some soreness and pain in your abdomen and vagina.  If you are breastfeeding, you may have uterine contractions every time you breastfeed for up to several weeks postpartum. Uterine contractions help your uterus return to its normal size.  It is normal to have vaginal bleeding (lochia) after delivery. The amount and appearance of lochia is often similar to a menstrual period in the first week after delivery. It will gradually decrease over the next few weeks to a dry, yellow-brown discharge. For most women, lochia stops completely by 6-8 weeks after delivery.  Vaginal bleeding can vary from woman to woman.  Within the first few days after delivery, you may have breast engorgement. This is when your breasts feel heavy, full, and uncomfortable. Your breasts may also throb and feel hard, tightly stretched, warm, and tender. After this occurs, you may have milk leaking from your breasts.Your health care provider can help you relieve discomfort due to breast engorgement. Breast engorgement should go away within a few days.  You may feel more sad or worried than normal due to hormonal changes after delivery. These feelings should not last more than a few days. If these feelings do not go away after several days, speak with your health care provider. How should I care for myself?  Tell your health care provider if you have pain or discomfort.  Drink enough water to keep your urine clear or pale yellow.  Wash your hands thoroughly with soap and water for at least 20 seconds after changing your sanitary pads, after using the toilet, and before holding or feeding your baby.  If you are not breastfeeding, avoid touching your breasts a lot. Doing this can make your breasts produce more milk.  If you become weak or lightheaded, or you feel like you might faint, ask for help before: ? Getting out of bed. ? Showering.  Change your sanitary pads frequently. Watch for any changes in your flow, such as a sudden increase in volume, a change in color, the passing of large  blood clots. If you pass a blood clot from your vagina, save it to show to your health care provider. Do not flush blood clots down the toilet without having your health care provider look at them.  Make sure that all your vaccinations are up to date. This can help protect you and your baby from getting certain diseases. You may need to have immunizations done before you leave the hospital.  If desired, talk with your health care provider about methods of family planning or birth control  (contraception). How can I start bonding with my baby? Spending as much time as possible with your baby is very important. During this time, you and your baby can get to know each other and develop a bond. Having your baby stay with you in your room (rooming in) can give you time to get to know your baby. Rooming in can also help you become comfortable caring for your baby. Breastfeeding can also help you bond with your baby. How can I plan for returning home with my baby?  Make sure that you have a car seat installed in your vehicle. ? Your car seat should be checked by a certified car seat installer to make sure that it is installed safely. ? Make sure that your baby fits into the car seat safely.  Ask your health care provider any questions you have about caring for yourself or your baby. Make sure that you are able to contact your health care provider with any questions after leaving the hospital. This information is not intended to replace advice given to you by your health care provider. Make sure you discuss any questions you have with your health care provider. Document Released: 01/30/2007 Document Revised: 09/07/2015 Document Reviewed: 03/09/2015 Elsevier Interactive Patient Education  [employer]2018 [employer]

## 2017-03-29 ENCOUNTER — Encounter: Payer: Self-pay | Admitting: Family Medicine

## 2017-04-20 ENCOUNTER — Inpatient Hospital Stay (HOSPITAL_COMMUNITY)
Admission: AD | Admit: 2017-04-20 | Discharge: 2017-04-20 | Disposition: A | Discharge status: Home / Self Care | Payer: Medicaid Other | Source: Ambulatory Visit | Attending: Obstetrics and Gynecology | Admitting: Obstetrics and Gynecology

## 2017-04-20 ENCOUNTER — Encounter (HOSPITAL_COMMUNITY): Payer: Self-pay | Admitting: *Deleted

## 2017-04-20 ENCOUNTER — Other Ambulatory Visit: Payer: Self-pay

## 2017-04-20 DIAGNOSIS — G8918 Other acute postprocedural pain: Secondary | ICD-10-CM

## 2017-04-20 DIAGNOSIS — Z791 Long term (current) use of non-steroidal anti-inflammatories (NSAID): Secondary | ICD-10-CM | POA: Diagnosis not present

## 2017-04-20 DIAGNOSIS — N939 Abnormal uterine and vaginal bleeding, unspecified: Secondary | ICD-10-CM | POA: Diagnosis not present

## 2017-04-20 DIAGNOSIS — Z79899 Other long term (current) drug therapy: Secondary | ICD-10-CM | POA: Diagnosis not present

## 2017-04-20 DIAGNOSIS — N853 Subinvolution of uterus: Secondary | ICD-10-CM | POA: Diagnosis not present

## 2017-04-20 DIAGNOSIS — O99893 Other specified diseases and conditions complicating puerperium: Secondary | ICD-10-CM

## 2017-04-20 DIAGNOSIS — N9489 Other specified conditions associated with female genital organs and menstrual cycle: Secondary | ICD-10-CM

## 2017-04-20 DIAGNOSIS — R109 Unspecified abdominal pain: Secondary | ICD-10-CM | POA: Diagnosis present

## 2017-04-20 LAB — URINALYSIS, ROUTINE W REFLEX MICROSCOPIC
Bilirubin Urine: NEGATIVE
Glucose, UA: NEGATIVE mg/dL
Ketones, ur: NEGATIVE mg/dL
Nitrite: NEGATIVE
Protein, ur: NEGATIVE mg/dL
Specific Gravity, Urine: 1.011 (ref 1.005–1.030)
pH: 5 (ref 5.0–8.0)

## 2017-04-20 MED ORDER — ACETAMINOPHEN 500 MG PO TABS
1000.0000 mg | ORAL_TABLET | Freq: Once | ORAL | Status: AC
Start: 2017-04-20 — End: 2017-04-20
  Administered 2017-04-20: 1000 mg via ORAL
  Filled 2017-04-20: qty 2

## 2017-04-20 NOTE — MAU Provider Note (Signed)
Chief Complaint: Vaginal Bleeding   First Provider Initiated Contact with Patient 04/20/17 [phone]      SUBJECTIVE HPI: Lisa Reese is a 27 y.o. G1P1001 PP patient s/p SVD on 12/5 who presents to maternity admissions reporting abdominal cramping and bleeding. She reports that she has been cramping intermittently since delivery, has taken motrin for pain control- last dose was yesterday morning, currently rates pain 4/10. She reports that some times the pain increases but she is can't take another motrin because it is too soon for the next dose. She reports heavy bleeding last night when she got up to use the bathroom, none currently. She states that no clots was present with bleeding. Reports that she was having spotting prior to the increase of bleeding. She denies  vaginal itching/burning, urinary symptoms, h/a, dizziness, n/v, or fever/chills.  She denies IC.   Past Medical History:  Diagnosis Date  . Hyperemesis gravidarum   . Medical history non-contributory    Past Surgical History:  Procedure Laterality Date  . NO PAST SURGERIES     Social History   Socioeconomic History  . Marital status: Married    Spouse name: Not on file  . Number of children: Not on file  . Years of education: Not on file  . Highest education level: Not on file  Social Needs  . Financial resource strain: Not on file  . Food insecurity - worry: Not on file  . Food insecurity - inability: Not on file  . Transportation needs - medical: Not on file  . Transportation needs - non-medical: Not on file  Occupational History  . Not on file  Tobacco Use  . Smoking status: Never Smoker  . Smokeless tobacco: Never Used  Substance and Sexual Activity  . Alcohol use: No  . Drug use: No  . Sexual activity: Yes    Birth control/protection: None  Other Topics Concern  . Not on file  Social History Narrative  . Not on file   No current facility-administered medications on file prior to encounter.     Current Outpatient Medications on File Prior to Encounter  Medication Sig Dispense Refill  . ibuprofen (ADVIL,MOTRIN) 600 MG tablet Take 1 tablet (600 mg total) by mouth every 6 (six) hours. 30 tablet 0  . Prenat w/o A Vit-FeFum-FePo-FA (CONCEPT OB) 130-92.4-1 MG CAPS Take 1 capsule by mouth daily. 30 capsule 11   No Known Allergies  ROS:  Review of Systems  Constitutional: Negative.   Respiratory: Negative.   Cardiovascular: Negative.   Gastrointestinal: Positive for abdominal pain. Negative for constipation, diarrhea, nausea and vomiting.  Genitourinary: Positive for vaginal bleeding. Negative for difficulty urinating, dysuria, frequency, pelvic pain, urgency and vaginal pain.  Musculoskeletal: Negative.   Neurological: Negative.   Psychiatric/Behavioral: Negative.    I have reviewed patient's Past Medical Hx, Surgical Hx, Family Hx, Social Hx, medications and allergies.   Physical Exam   Patient Vitals for the past 24 hrs:  BP Temp Temp src Pulse Resp SpO2 Height Weight  04/20/17 0703 115/75 97.6 F (36.4 C) Oral 70 18 100 % 5\' 4"  (1.626 m) 141 lb (64 kg)   Constitutional: Well-developed, well-nourished female in no acute distress.  Cardiovascular: normal rate Respiratory: normal effort GI: Abd soft, non-tender. Pos BS x 4,  MS: Extremities nontender, no edema, normal ROM Neurologic: Alert and oriented x 4.  GU: Neg CVAT.  Bimanual exam: Cervix 0/long/high, firm, anterior, neg CMT, uterus nontender, fundus @ SP, adnexa without tenderness, enlargement, or  mass  LAB RESULTS Results for orders placed or performed during the hospital encounter of 04/20/17 (from the past 24 hour(s))  Urinalysis, Routine w reflex microscopic     Status: Abnormal   Collection Time: 04/20/17  6:23 AM  Result Value Ref Range   Color, Urine YELLOW YELLOW   APPearance CLEAR CLEAR   Specific Gravity, Urine 1.011 1.005 - 1.030   pH 5.0 5.0 - 8.0   Glucose, UA NEGATIVE NEGATIVE mg/dL   Hgb  urine dipstick SMALL (A) NEGATIVE   Bilirubin Urine NEGATIVE NEGATIVE   Ketones, ur NEGATIVE NEGATIVE mg/dL   Protein, ur NEGATIVE NEGATIVE mg/dL   Nitrite NEGATIVE NEGATIVE   Leukocytes, UA LARGE (A) NEGATIVE   RBC / HPF 0-5 0 - 5 RBC/hpf   WBC, UA 6-30 0 - 5 WBC/hpf   Bacteria, UA RARE (A) NONE SEEN   Squamous Epithelial / LPF 0-5 (A) NONE SEEN   Mucus PRESENT     --/--/O POS, O POS (12/05 0950)  MAU Management/MDM: Orders Placed This Encounter  Procedures  . Urinalysis, Routine w reflex microscopic    Meds ordered this encounter  Medications  . acetaminophen (TYLENOL) tablet 1,000 mg   Treatments in MAU included 1,000mg  Tylenol- pain relieved with medication. Pt discharged with bleeding and pain precautions. Discussed reasons to return to MAU. Follow up as scheduled in the office for PP appt.   ASSESSMENT 1. Pain related to uterine involution     PLAN Discharge home Follow up as scheduled in the office for PP appt.  Educated on the use of Tylenol for pain on a schedule with Motrin.   Allergies as of 04/20/2017   No Known Allergies     Medication List    TAKE these medications   CONCEPT OB 130-92.4-1 MG Caps Take 1 capsule by mouth daily.   ibuprofen 600 MG tablet Commonly known as:  ADVIL,MOTRIN Take 1 tablet (600 mg total) by mouth every 6 (six) hours.       Steward Drone  Certified Nurse-Midwife 04/20/2017  7:21 AM

## 2017-04-20 NOTE — MAU Note (Signed)
Post Partum - Had a baby 03/22/2017. Vag delivery.  At 3 am, had a lot of vag bleeding in toilet  with tiny clots. On ride, here the bleeding stopped, my pad is completely dry.  Been having a lot cramps in lower abd since having baby, pain reliever not helping.

## 2017-05-01 ENCOUNTER — Other Ambulatory Visit: Payer: Self-pay | Admitting: Advanced Practice Midwife

## 2017-05-04 ENCOUNTER — Encounter: Payer: Self-pay | Admitting: Student

## 2017-05-04 ENCOUNTER — Ambulatory Visit (INDEPENDENT_AMBULATORY_CARE_PROVIDER_SITE_OTHER): Payer: Medicaid Other | Admitting: Student

## 2017-05-04 DIAGNOSIS — Z3400 Encounter for supervision of normal first pregnancy, unspecified trimester: Secondary | ICD-10-CM

## 2017-05-04 DIAGNOSIS — Z1389 Encounter for screening for other disorder: Secondary | ICD-10-CM | POA: Diagnosis not present

## 2017-05-04 NOTE — Progress Notes (Signed)
Subjective:     Lisa Reese is a 27 y.o. female who presents for a postpartum visit. She is 6 weeks postpartum following a spontaneous vaginal delivery. I have fully reviewed the prenatal and intrapartum course. The delivery was at 39 gestational weeks. Outcome: spontaneous vaginal delivery. Anesthesia: none. Postpartum course has been eventful with pelvic pain and vaginal pain. Patient says that she feels that her labia have not healed yet and it causes her occasional pain.  Baby's course has been uneventful. Baby is feeding by both breast and bottle - Gerber soothe. Bleeding no bleeding. Bowel function is abnormal: has not had a bowel movement in two days, otherwise she has a bowel movement every day. . Bladder function is normal. Patient is not sexually active. Contraception method is none. Postpartum depression screening: negative.  The following portions of the patient's history were reviewed and updated as appropriate: allergies, current medications, past family history, past medical history, past social history, past surgical history and problem list.  Review of Systems Pertinent items are noted in HPI.   Objective:    BP 126/77   Pulse 79   Ht 5' (1.524 m)   Wt 144 lb 11.2 oz (65.6 kg)   BMI 28.26 kg/m   General:  alert and cooperative   Breasts:  inspection negative, no nipple discharge or bleeding, no masses or nodularity palpable  Lungs: clear to auscultation bilaterally  Heart:  regular rate and rhythm, S1, S2 normal, no murmur, click, rub or gallop  Abdomen: soft, non-tender; bowel sounds normal; no masses,  no organomegaly   Vulva:  right labira minora mucosa still exposed and nhealed; no signs of infection. Area is tender to the touch. Skin tag at 7 o'clock.   Vagina: not evaluated  Cervix:  not evaluated  Corpus: not examined  Adnexa:  not evaluated  Rectal Exam: Not performed.        Assessment:    Healthy  postpartum exam other than vaginal laceration still in  the process of healing. . Pap smear not done at today's visit.  Deferred pap smear due to silver nitrite treatment today. Patient will have it done in two weeks.   Plan:    1. Contraception: none 2. Silver nitrite applied by Dr. Adrian BlackwaterStinson 3. Reviewed constipation relief measures.  4. Follow up in: 2 weeks or as needed.

## 2017-05-04 NOTE — Patient Instructions (Signed)

## 2017-05-18 ENCOUNTER — Encounter: Payer: Self-pay | Admitting: Student

## 2017-05-18 ENCOUNTER — Other Ambulatory Visit (HOSPITAL_COMMUNITY)
Admission: RE | Admit: 2017-05-18 | Discharge: 2017-05-18 | Disposition: A | Discharge status: Home / Self Care | Payer: Medicaid Other | Source: Ambulatory Visit | Attending: Family Medicine | Admitting: Family Medicine

## 2017-05-18 ENCOUNTER — Ambulatory Visit (INDEPENDENT_AMBULATORY_CARE_PROVIDER_SITE_OTHER): Payer: Medicaid Other | Admitting: Student

## 2017-05-18 VITALS — BP 122/72 | HR 79 | Ht 62.0 in | Wt 138.6 lb

## 2017-05-18 DIAGNOSIS — N898 Other specified noninflammatory disorders of vagina: Secondary | ICD-10-CM

## 2017-05-18 DIAGNOSIS — Z01419 Encounter for gynecological examination (general) (routine) without abnormal findings: Secondary | ICD-10-CM | POA: Diagnosis present

## 2017-05-19 NOTE — Patient Instructions (Signed)

## 2017-05-19 NOTE — Progress Notes (Signed)
Subjective:     Patient ID: Lisa Reese, female   DOB: 12/11/1990, 27 y.o.   MRN: [id number]  HPI Patient is here for a follow-up Pap smear and to check on her vaginal laceration status/pop silver nitrite 2 weeks ago. She also wants to discuss birth control.   Review of Systems  Constitutional: Negative.   HENT: Negative.   Eyes: Negative.   Respiratory: Negative.   Genitourinary: Negative.   Musculoskeletal: Negative.   Neurological: Negative.   Psychiatric/Behavioral: Negative.        Objective:   Physical Exam  Constitutional: She appears well-developed and well-nourished.  HENT:  Head: Normocephalic.  Eyes: Pupils are equal, round, and reactive to light.  Neck: Normal range of motion.  Pulmonary/Chest: Effort normal.  Abdominal: Soft.  Genitourinary: Vaginal discharge found.  Genitourinary Comments: Laceration to right labia minora next to urethra appears well healed; no evidence of bleeding, area is non-tender, no discharge. Skin tag at 7 o'clock still prominent.   Musculoskeletal: Normal range of motion.  Neurological: She is alert.  Skin: Skin is warm and dry.       Assessment:      Vaginal laceration well-healed; skin tag still evident.     Plan:     -Pap done today -Laceration well-healed, skin tag still prominent.    -Discussed many forms of birth control with patient and her husband; she is very unsure what to do. Will consider and let us know what she decides.  -Follow up in 1 year unless pap results come back positive.  -All questions answered.  Luna KitchensKathryn Kooistra

## 2017-05-23 LAB — CYTOLOGY - PAP
DIAGNOSIS: NEGATIVE
DIAGNOSIS: REACTIVE

## 2017-06-07 ENCOUNTER — Encounter: Payer: Self-pay | Admitting: Obstetrics & Gynecology

## 2017-06-07 ENCOUNTER — Encounter: Payer: Self-pay | Admitting: Student

## 2017-07-27 ENCOUNTER — Inpatient Hospital Stay (HOSPITAL_COMMUNITY)
Admission: AD | Admit: 2017-07-27 | Discharge: 2017-07-27 | Disposition: A | Discharge status: Home / Self Care | Payer: Medicaid Other | Source: Ambulatory Visit | Attending: Obstetrics and Gynecology | Admitting: Obstetrics and Gynecology

## 2017-07-27 ENCOUNTER — Encounter (HOSPITAL_COMMUNITY): Payer: Self-pay

## 2017-07-27 DIAGNOSIS — K649 Unspecified hemorrhoids: Secondary | ICD-10-CM

## 2017-07-27 DIAGNOSIS — N939 Abnormal uterine and vaginal bleeding, unspecified: Secondary | ICD-10-CM | POA: Insufficient documentation

## 2017-07-27 DIAGNOSIS — Z3202 Encounter for pregnancy test, result negative: Secondary | ICD-10-CM | POA: Insufficient documentation

## 2017-07-27 LAB — URINALYSIS, ROUTINE W REFLEX MICROSCOPIC
Bilirubin Urine: NEGATIVE
GLUCOSE, UA: NEGATIVE mg/dL
Hgb urine dipstick: NEGATIVE
Ketones, ur: NEGATIVE mg/dL
Nitrite: NEGATIVE
PH: 6 (ref 5.0–8.0)
Protein, ur: NEGATIVE mg/dL
SPECIFIC GRAVITY, URINE: 1.018 (ref 1.005–1.030)

## 2017-07-27 LAB — POCT PREGNANCY, URINE: PREG TEST UR: NEGATIVE

## 2017-07-27 MED ORDER — HYDROCORTISONE 2.5 % RE CREA: TOPICAL_CREAM | RECTAL | 0 refills | Status: DC

## 2017-07-27 NOTE — Discharge Instructions (Signed)
Hemorrhoids Hemorrhoids are swollen veins in and around the rectum or anus. There are two types of hemorrhoids:  Internal hemorrhoids. These occur in the veins that are just inside the rectum. They may poke through to the outside and become irritated and painful.  External hemorrhoids. These occur in the veins that are outside of the anus and can be felt as a painful swelling or hard lump near the anus.  Most hemorrhoids do not cause serious problems, and they can be managed with home treatments such as diet and lifestyle changes. If home treatments do not help your symptoms, procedures can be done to shrink or remove the hemorrhoids. What are the causes? This condition is caused by increased pressure in the anal area. This pressure may result from various things, including:  Constipation.  Straining to have a bowel movement.  Diarrhea.  Pregnancy.  Obesity.  Sitting for long periods of time.  Heavy lifting or other activity that causes you to strain.  Anal sex.  What are the signs or symptoms? Symptoms of this condition include:  Pain.  Anal itching or irritation.  Rectal bleeding.  Leakage of stool (feces).  Anal swelling.  One or more lumps around the anus.  How is this diagnosed? This condition can often be diagnosed through a visual exam. Other exams or tests may also be done, such as:  Examination of the rectal area with a gloved hand (digital rectal exam).  Examination of the anal canal using a small tube (anoscope).  A blood test, if you have lost a significant amount of blood.  A test to look inside the colon (sigmoidoscopy or colonoscopy).  How is this treated? This condition can usually be treated at home. However, various procedures may be done if dietary changes, lifestyle changes, and other home treatments do not help your symptoms. These procedures can help make the hemorrhoids smaller or remove them completely. Some of these procedures involve  surgery, and others do not. Common procedures include:  Rubber band ligation. Rubber bands are placed at the base of the hemorrhoids to cut off the blood supply to them.  Sclerotherapy. Medicine is injected into the hemorrhoids to shrink them.  Infrared coagulation. A type of light energy is used to get rid of the hemorrhoids.  Hemorrhoidectomy surgery. The hemorrhoids are surgically removed, and the veins that supply them are tied off.  Stapled hemorrhoidopexy surgery. A circular stapling device is used to remove the hemorrhoids and use staples to cut off the blood supply to them.  Follow these instructions at home: Eating and drinking  Eat foods that have a lot of fiber in them, such as whole grains, beans, nuts, fruits, and vegetables. Ask your health care provider about taking products that have added fiber (fiber supplements).  Drink enough fluid to keep your urine clear or pale yellow. Managing pain and swelling  Take warm sitz baths for 20 minutes, 3-4 times a day to ease pain and discomfort.  If directed, apply ice to the affected area. Using ice packs between sitz baths may be helpful. ? Put ice in a plastic bag. ? Place a towel between your skin and the bag. ? Leave the ice on for 20 minutes, 2-3 times a day. General instructions  Take over-the-counter and prescription medicines only as told by your health care provider.  Use medicated creams or suppositories as told.  Exercise regularly.  Go to the bathroom when you have the urge to have a bowel movement. Do not wait.    Avoid straining to have bowel movements.  Keep the anal area dry and clean. Use wet toilet paper or moist towelettes after a bowel movement.  Do not sit on the toilet for long periods of time. This increases blood pooling and pain. Contact a health care provider if:  You have increasing pain and swelling that are not controlled by treatment or medicine.  You have uncontrolled bleeding.  You  have difficulty having a bowel movement, or you are unable to have a bowel movement.  You have pain or inflammation outside the area of the hemorrhoids. This information is not intended to replace advice given to you by your health care provider. Make sure you discuss any questions you have with your health care provider. Document Released: 04/01/2000 Document Revised: 09/02/2015 Document Reviewed: 12/17/2014 Elsevier Interactive Patient Education  2018 [employer] Inc[employer]  High-Fiber Diet Fiber, also called dietary fiber, is a type of carbohydrate found in fruits, vegetables, whole grains, and beans. A high-fiber diet can have many health benefits. Your health care provider may recommend a high-fiber diet to help:  Prevent constipation. Fiber can make your bowel movements more regular.  Lower your cholesterol.  Relieve hemorrhoids, uncomplicated diverticulosis, or irritable bowel syndrome.  Prevent overeating as part of a weight-loss plan.  Prevent heart disease, type 2 diabetes, and certain cancers.  What is my plan? The recommended daily intake of fiber includes:  38 grams for men under age 50.  30 grams for men over age 50.  25 grams for women under age 50.  21 grams for women over age 50.  You can get the recommended daily intake of dietary fiber by eating a variety of fruits, vegetables, grains, and beans. Your health care provider may also recommend a fiber supplement if it is not possible to get enough fiber through your diet. What do I need to know about a high-fiber diet?  Fiber supplements have not been widely studied for their effectiveness, so it is better to get fiber through food sources.  Always check the fiber content on thenutrition facts label of any prepackaged food. Look for foods that contain at least 5 grams of fiber per serving.  Ask your dietitian if you have questions about specific foods that are related to your condition, especially if those foods are not  listed in the following section.  Increase your daily fiber consumption gradually. Increasing your intake of dietary fiber too quickly may cause bloating, cramping, or gas.  Drink plenty of water. Water helps you to digest fiber. What foods can I eat? Grains Whole-grain breads. Multigrain cereal. Oats and oatmeal. Brown rice. Barley. Bulgur wheat. Millet. Bran muffins. Popcorn. Rye wafer crackers. Vegetables Sweet potatoes. Spinach. Kale. Artichokes. Cabbage. Broccoli. Green peas. Carrots. Squash. Fruits Berries. Pears. Apples. Oranges. Avocados. Prunes and raisins. Dried figs. Meats and Other Protein Sources Navy, kidney, pinto, and soy beans. Split peas. Lentils. Nuts and seeds. Dairy Fiber-fortified yogurt. Beverages Fiber-fortified soy milk. Fiber-fortified orange juice. Other Fiber bars. The items listed above may not be a complete list of recommended foods or beverages. Contact your dietitian for more options. What foods are not recommended? Grains White bread. Pasta made with refined flour. White rice. Vegetables Fried potatoes. Canned vegetables. Well-cooked vegetables. Fruits Fruit juice. Cooked, strained fruit. Meats and Other Protein Sources Fatty cuts of meat. Fried poultry or fried fish. Dairy Milk. Yogurt. Cream cheese. Sour cream. Beverages Soft drinks. Other Cakes and pastries. Butter and oils. The items listed above may not be a   complete list of foods and beverages to avoid. Contact your dietitian for more information. What are some tips for including high-fiber foods in my diet?  Eat a wide variety of high-fiber foods.  Make sure that half of all grains consumed each day are whole grains.  Replace breads and cereals made from refined flour or white flour with whole-grain breads and cereals.  Replace white rice with brown rice, bulgur wheat, or millet.  Start the day with a breakfast that is high in fiber, such as a cereal that contains at least 5 grams  of fiber per serving.  Use beans in place of meat in soups, salads, or pasta.  Eat high-fiber snacks, such as berries, raw vegetables, nuts, or popcorn. This information is not intended to replace advice given to you by your health care provider. Make sure you discuss any questions you have with your health care provider. Document Released: 04/04/2005 Document Revised: 09/10/2015 Document Reviewed: 09/17/2013 Elsevier Interactive Patient Education  2018 Elsevier Inc.  

## 2017-07-27 NOTE — MAU Provider Note (Signed)
History     CSN: [id number]  Arrival date and time: 07/27/17 4098   First Provider Initiated Contact with Patient 07/27/17 1036      Chief Complaint  Patient presents with  . Vaginal Bleeding   HPI  Ms.  Lisa Reese is a 27 y.o. year old G53P1001 non-pregnant female who presents to MAU reporting bleeding. She reports her period stopped on 07/25/17. She reports having a BM with bleeding on Tuesday Wednesday and this morning. She is unsure if it is vaginal or rectal bleeding. Although, she reports she sees bleeding on her tissue when she wipes her rectal area, but none when she wipes the urethral/vaginal area.  Past Medical History:  Diagnosis Date  . Hyperemesis gravidarum     Past Surgical History:  Procedure Laterality Date  . NO PAST SURGERIES      No family history on file.  Social History   Tobacco Use  . Smoking status: Never Smoker  . Smokeless tobacco: Never Used  Substance Use Topics  . Alcohol use: No  . Drug use: No    Allergies: No Known Allergies  Medications Prior to Admission  Medication Sig Dispense Refill Last Dose  . Prenat w/o A Vit-FeFum-FePo-FA (CONCEPT OB) 130-92.4-1 MG CAPS Take 1 capsule by mouth daily. 30 capsule 11 07/26/2017 at Unknown time  . ibuprofen (ADVIL,MOTRIN) 600 MG tablet Take 1 tablet (600 mg total) by mouth every 6 (six) hours. (Patient not taking: Reported on 05/04/2017) 30 tablet 0 Not Taking    Review of Systems  Constitutional: Negative.   HENT: Negative.   Eyes: Negative.   Respiratory: Negative.   Cardiovascular: Negative.   Gastrointestinal: Positive for anal bleeding.  Endocrine: Negative.   Genitourinary: Negative.   Musculoskeletal: Negative.   Skin: Negative.   Allergic/Immunologic: Negative.   Neurological: Negative.   Hematological: Negative.   Psychiatric/Behavioral: Negative.    Physical Exam   Blood pressure 115/76, pulse 69, temperature 98.3 F (36.8 C), temperature source Oral, resp. rate 16,  weight 144 lb (65.3 kg), last menstrual period 07/20/2017, currently breastfeeding.  Physical Exam  Nursing note and vitals reviewed. Constitutional: She is oriented to person, place, and time. She appears well-developed and well-nourished.  HENT:  Head: Normocephalic and atraumatic.  Eyes: Pupils are equal, round, and reactive to light.  Neck: Normal range of motion.  Cardiovascular: Normal rate, regular rhythm and normal heart sounds.  Respiratory: Effort normal and breath sounds normal.  GI: Soft. Bowel sounds are normal.  Musculoskeletal: Normal range of motion.  Neurological: She is alert and oriented to person, place, and time.  Skin: Skin is warm and dry.  Psychiatric: She has a normal mood and affect. Her behavior is normal. Judgment and thought content normal.   MAU Course  Procedures  MDM [facility] UPT Speculum Exam  Results for orders placed or performed during the hospital encounter of 07/27/17 (from the past 24 hour(s))  Urinalysis, Routine w reflex microscopic     Status: Abnormal   Collection Time: 07/27/17  9:22 AM  Result Value Ref Range   Color, Urine YELLOW YELLOW   APPearance CLEAR CLEAR   Specific Gravity, Urine 1.018 1.005 - 1.030   pH 6.0 5.0 - 8.0   Glucose, UA NEGATIVE NEGATIVE mg/dL   Hgb urine dipstick NEGATIVE NEGATIVE   Bilirubin Urine NEGATIVE NEGATIVE   Ketones, ur NEGATIVE NEGATIVE mg/dL   Protein, ur NEGATIVE NEGATIVE mg/dL   Nitrite NEGATIVE NEGATIVE   Leukocytes, UA MODERATE (A) NEGATIVE  RBC / HPF 0-5 0 - 5 RBC/hpf   WBC, UA 0-5 0 - 5 WBC/hpf   Bacteria, UA RARE (A) NONE SEEN   Squamous Epithelial / LPF 0-5 (A) NONE SEEN   Mucus PRESENT   Pregnancy, urine POC     Status: None   Collection Time: 07/27/17  9:39 AM  Result Value Ref Range   Preg Test, Ur NEGATIVE NEGATIVE    Assessment and Plan  Hemorrhoids, unspecified hemorrhoid type  - Rx for Anusol-HC 2.5% rectal cream BID - Information provided on hemorrhoids, high fiber diet and  hemorrhoid cream - Advised that if no improvement in bleeding sx's 1 wk after medication use, she should seek care at Urgent Care, [facility] or [facility] - Discharge patient - Patient verbalized an understanding of the plan of care and agrees.    Raelyn Moraolitta Kemon Devincenzi, MSN, CNM 07/27/2017, 11:38 AM

## 2017-07-27 NOTE — MAU Provider Note (Addendum)
Chief Complaint: Vaginal Bleeding   First Provider Initiated Contact with Patient 07/27/17 1036      SUBJECTIVE HPI: Lisa Reese is a 27 y.o. G1P1001 who presents to maternity admissions reporting bleeding of unknown orgin since Monday (4/8). Reports that she is unsure if the bleeding is vaginal or rectal in origin. LMP ended on 4/8. Upon further questioning, she states that she has noticed a moderate amount of bright red blood in the toilet bowl and on the toilet paper after each bowel movement. Denies history of hemorrhoids. Denies history of previous episodes prior to Monday.States that she feel she remains well hydrated and consumed an appropriate amount of fiber through her diet.  She also endorses that she has felt weak while working the past couple of days; is unsure if this is related to rectal bleeding. Denies dyschezia, constipation, abdominal pain, dehydration, headaches, and lightheadedness/ dizziness.   HPI  Past Medical History:  Diagnosis Date  . Hyperemesis gravidarum    Past Surgical History:  Procedure Laterality Date  . NO PAST SURGERIES     Social History   Socioeconomic History  . Marital status: Married    Spouse name: Not on file  . Number of children: Not on file  . Years of education: Not on file  . Highest education level: Not on file  Occupational History  . Not on file  Social Needs  . Financial resource strain: Not on file  . Food insecurity:    Worry: Not on file    Inability: Not on file  . Transportation needs:    Medical: Not on file    Non-medical: Not on file  Tobacco Use  . Smoking status: Never Smoker  . Smokeless tobacco: Never Used  Substance and Sexual Activity  . Alcohol use: No  . Drug use: No  . Sexual activity: Yes    Birth control/protection: None  Lifestyle  . Physical activity:    Days per week: Not on file    Minutes per session: Not on file  . Stress: Not on file  Relationships  . Social connections:    Talks  on phone: Not on file    Gets together: Not on file    Attends religious service: Not on file    Active member of club or organization: Not on file    Attends meetings of clubs or organizations: Not on file    Relationship status: Not on file  . Intimate partner violence:    Fear of current or ex partner: Not on file    Emotionally abused: Not on file    Physically abused: Not on file    Forced sexual activity: Not on file  Other Topics Concern  . Not on file  Social History Narrative  . Not on file   No current facility-administered medications on file prior to encounter.    Current Outpatient Medications on File Prior to Encounter  Medication Sig Dispense Refill  . Prenat w/o A Vit-FeFum-FePo-FA (CONCEPT OB) 130-92.4-1 MG CAPS Take 1 capsule by mouth daily. 30 capsule 11  . ibuprofen (ADVIL,MOTRIN) 600 MG tablet Take 1 tablet (600 mg total) by mouth every 6 (six) hours. (Patient not taking: Reported on 05/04/2017) 30 tablet 0   No Known Allergies  ROS:  Review of Systems  Gastrointestinal: Positive for anal bleeding. Negative for abdominal pain, constipation and rectal pain.  Neurological: Positive for weakness. Negative for light-headedness and headaches.    I have reviewed patient's Past Medical Hx, Surgical  Hx, Family Hx, Social Hx, medications and allergies.   Physical Exam   Patient Vitals for the past 24 hrs:  BP Temp Temp src Pulse Resp Weight  07/27/17 1137 - - - - 16 -  07/27/17 0929 115/76 98.3 F (36.8 C) Oral 69 18 65.3 kg (144 lb)   Constitutional: Well-developed, well-nourished female in no acute distress.  Cardiovascular: normal rate Respiratory: normal effort GI: Abd soft, non-tender. Pos BS x 4 Neurologic: Alert and oriented x 4.  PELVIC EXAM: Cervix pink, visually closed, without lesion, scant white creamy discharge, vaginal walls and external genitalia normal. No blood present in the vaginal vault.  LAB RESULTS Results for orders placed or  performed during the hospital encounter of 07/27/17 (from the past 24 hour(s))  Urinalysis, Routine w reflex microscopic     Status: Abnormal   Collection Time: 07/27/17  9:22 AM  Result Value Ref Range   Color, Urine YELLOW YELLOW   APPearance CLEAR CLEAR   Specific Gravity, Urine 1.018 1.005 - 1.030   pH 6.0 5.0 - 8.0   Glucose, UA NEGATIVE NEGATIVE mg/dL   Hgb urine dipstick NEGATIVE NEGATIVE   Bilirubin Urine NEGATIVE NEGATIVE   Ketones, ur NEGATIVE NEGATIVE mg/dL   Protein, ur NEGATIVE NEGATIVE mg/dL   Nitrite NEGATIVE NEGATIVE   Leukocytes, UA MODERATE (A) NEGATIVE   RBC / HPF 0-5 0 - 5 RBC/hpf   WBC, UA 0-5 0 - 5 WBC/hpf   Bacteria, UA RARE (A) NONE SEEN   Squamous Epithelial / LPF 0-5 (A) NONE SEEN   Mucus PRESENT   Pregnancy, urine POC     Status: None   Collection Time: 07/27/17  9:39 AM  Result Value Ref Range   Preg Test, Ur NEGATIVE NEGATIVE    --/--/O POS, O POS (12/05 0950)  IMAGING No results found.  MAU Management/MDM: Orders Placed This Encounter  Procedures  . Urinalysis, Routine w reflex microscopic  . Pregnancy, urine POC    No orders of the defined types were placed in this encounter.   ASSESSMENT - Probable internal hemorrhoids   PLAN -Obtain UPT -Obtain urinalysis  -Recommend OTC hemorrhoid creams as needed -Discharge home    [facility], Student-PA 07/27/2017  11:40 AM

## 2017-07-27 NOTE — MAU Note (Signed)
Pt reports she  Finished her period on 07/25/17. Pt stated she went to have a BM and she is seeing blood. Not sure if it is coming from her vagina or rectum.

## 2017-08-02 ENCOUNTER — Encounter: Payer: Self-pay | Admitting: Medical

## 2017-08-02 ENCOUNTER — Encounter: Payer: Self-pay | Admitting: Student

## 2017-08-02 ENCOUNTER — Encounter: Payer: Self-pay | Admitting: Family Medicine

## 2017-08-02 ENCOUNTER — Encounter: Payer: Self-pay | Admitting: Obstetrics & Gynecology

## 2017-08-02 ENCOUNTER — Encounter: Payer: Self-pay | Admitting: Advanced Practice Midwife

## 2017-08-02 ENCOUNTER — Encounter: Payer: Self-pay | Admitting: Nurse Practitioner

## 2017-08-30 ENCOUNTER — Encounter (HOSPITAL_COMMUNITY): Payer: Self-pay | Admitting: Emergency Medicine

## 2017-08-30 ENCOUNTER — Emergency Department (HOSPITAL_COMMUNITY)
Admission: EM | Admit: 2017-08-30 | Discharge: 2017-08-30 | Disposition: A | Discharge status: Home / Self Care | Payer: Medicaid Other | Attending: Emergency Medicine | Admitting: Emergency Medicine

## 2017-08-30 ENCOUNTER — Other Ambulatory Visit: Payer: Self-pay

## 2017-08-30 DIAGNOSIS — L03011 Cellulitis of right finger: Secondary | ICD-10-CM

## 2017-08-30 DIAGNOSIS — Z79899 Other long term (current) drug therapy: Secondary | ICD-10-CM | POA: Insufficient documentation

## 2017-08-30 NOTE — ED Triage Notes (Signed)
Pt states 2 weeks of right middle finger swelling with decreased mobility. Denies injury to the finger.

## 2017-08-30 NOTE — ED Provider Notes (Signed)
[facility] EMERGENCY DEPARTMENT Provider Note   CSN: [id number] Arrival date & time: 08/30/17  1133     History   Chief Complaint Chief Complaint  Patient presents with  . finger swelling    right middle    HPI Lisa Reese is a 27 y.o. female.  HPI   Patient is a 27 year old female who is presenting the ED to be evaluated for pain to the distal portion of the right middle finger that has been present for the last 2 weeks but seem to be worsening.  Patient states pain is severe.  Also reports associated swelling.  No fevers.  And from the finger.  She has not tried taking anything for symptoms.  States she bites her nails.  Denies any injury to her finger.  No pain to the proximal portion of her finger.  No pain with extension.  Past Medical History:  Diagnosis Date  . Hyperemesis gravidarum     Patient Active Problem List   Diagnosis Date Noted  . Hemorrhoids 07/27/2017  . Third degree laceration of perineum, type 3c 03/22/2017  . Low grade squamous intraepith lesion on cytologic smear cervix (lgsil) 10/12/2016    Past Surgical History:  Procedure Laterality Date  . NO PAST SURGERIES       OB History    Gravida  1   Para  1   Term  1   Preterm      AB      Living  1     SAB      TAB      Ectopic      Multiple  0   Live Births  1            Home Medications    Prior to Admission medications   Medication Sig Start Date End Date Taking? Authorizing Provider  hydrocortisone (ANUSOL-HC) 2.5 % rectal cream Apply rectally 2 times daily 07/27/17   Raelyn Mora, CNM  ibuprofen (ADVIL,MOTRIN) 600 MG tablet Take 1 tablet (600 mg total) by mouth every 6 (six) hours. Patient not taking: Reported on 05/04/2017 03/24/17   Cresenzo-Dishmon, Scarlette Calico, CNM  Prenat w/o A Vit-FeFum-FePo-FA (CONCEPT OB) 130-92.4-1 MG CAPS Take 1 capsule by mouth daily. 11/09/16   Reva Bores, MD    Family History No family history on  file.  Social History Social History   Tobacco Use  . Smoking status: Never Smoker  . Smokeless tobacco: Never Used  Substance Use Topics  . Alcohol use: No  . Drug use: No     Allergies   Patient has no known allergies.   Review of Systems Review of Systems  Constitutional: Negative for fever.  Musculoskeletal:       Finger pain and swelling     Physical Exam Updated Vital Signs BP 126/73 (BP Location: Right Arm)   Pulse 72   Temp 98.3 F (36.8 C) (Oral)   Resp 20   LMP 07/28/2017   SpO2 100%   Physical Exam  Constitutional: She appears well-developed and well-nourished. No distress.  HENT:  Head: Normocephalic and atraumatic.  Eyes: Conjunctivae are normal.  Neck: Neck supple.  Cardiovascular: Normal rate.  Pulmonary/Chest: Effort normal.  Musculoskeletal: Normal range of motion.  Neurological: She is alert.  Skin: Skin is warm and dry.  Patient has paronychia to the right middle finger.  Exam not consistent with felon.  Tenderness over the medial aspect of the right third nailbed.  No pain with extension.  No tenderness along the flexor tendon.  Brisk cap refill.  Normal sensation.  Psychiatric: She has a normal mood and affect.  Nursing note and vitals reviewed.    ED Treatments / Results  Labs (all labs ordered are listed, but only abnormal results are displayed) Labs Reviewed - No data to display  EKG None  Radiology No results found.  Procedures .Marland KitchenIncision and Drainage Date/Time: 08/30/2017 12:31 PM Performed by: Karrie Meres, PA-C Authorized by: Karrie Meres, PA-C   Consent:    Consent obtained:  Verbal   Consent given by:  Patient   Risks discussed:  Incomplete drainage and bleeding   Alternatives discussed:  No treatment Location:    Indications for incision and drainage: paronychia.   Location: right middle finger. Pre-procedure details:    Procedure prep: alcohol. Anesthesia (see MAR for exact dosages):     Anesthesia method: pain-eaze spray. Procedure type:    Complexity:  Simple Procedure details:    Incision type: 18 gauze needle, stab incision.   Drainage:  Purulent   Drainage amount:  Scant   Wound treatment:  Wound left open   Packing materials:  None Post-procedure details:    Patient tolerance of procedure:  Tolerated well, no immediate complications   (including critical care time)  Medications Ordered in ED Medications - No data to display   Initial Impression / Assessment and Plan / ED Course  I have reviewed the triage vital signs and the nursing notes.  Pertinent labs & imaging results that were available during my care of the patient were reviewed by me and considered in my medical decision making (see chart for details).     Final Clinical Impressions(s) / ED Diagnoses   Final diagnoses:  Paronychia of finger of right hand   Patient with paronychia amenable to incision and drainage.  Amount of purulent drainage was obtained.  Bacitracin applied and Band-Aid applied.  No antibiotic indicated. Encouraged home warm soaks and flushing.  Advised to apply antibiotic ointment to the wound for the next 5 to 10 days.  Advised return to ER for any new or worsening symptoms.  advised to follow-up with PCP.    ED Discharge Orders    None       Rayne Du 08/30/17 1238    Benjiman Core, MD 08/30/17 [phone]

## 2017-08-30 NOTE — Discharge Instructions (Addendum)
Please apply antibiotic ointment to the finger for the next 5 to 10 days.  Do this 3 times per day.  You should also complete warm soaks about 3 times per day for the same time period. Please follow up with your primary care provider within 5-7 days for re-evaluation of your symptoms. If you do not have a primary care provider, information for a healthcare clinic has been provided for you to make arrangements for follow up care. Please return to the emergency room immediately if you experience any new or worsening symptoms or any symptoms that indicate worsening infection such as fevers, increased redness/swelling/pain, warmth, or drainage from the affected area.

## 2017-12-19 IMAGING — CR DG CHEST 1V
1 series · 1 of 1 positions shown · non-contrast
Comparison: 09/06/2016

CLINICAL DATA: Positive PPD

EXAM:
CHEST 1 VIEW

[chest pa]
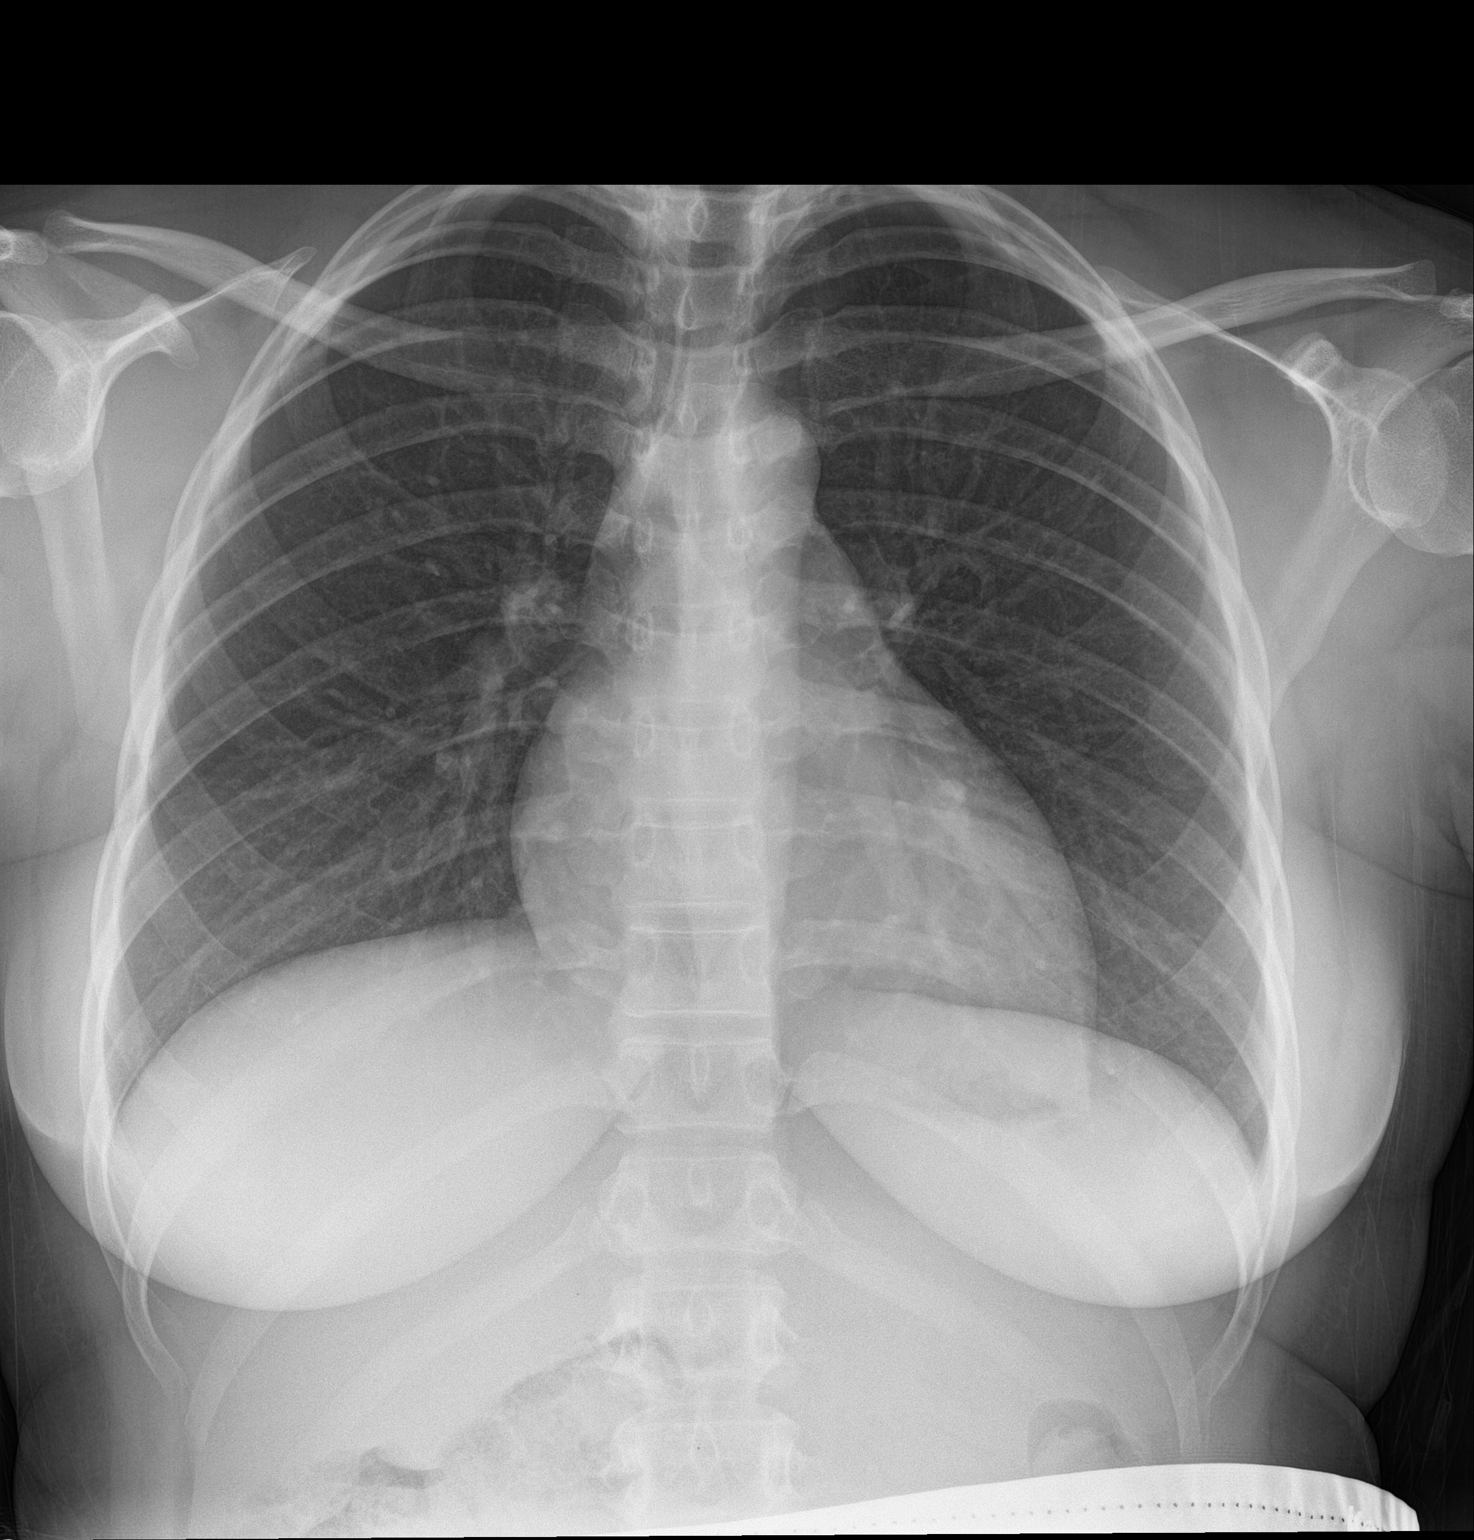

[1 of 1 positions shown; findings below may reference images not displayed]

FINDINGS: Normal heart size, mediastinal contours, and pulmonary vascularity.

Lungs clear.

No pleural effusion or pneumothorax.

Bones unremarkable.
IMPRESSION: Normal exam.

## 2018-05-24 ENCOUNTER — Encounter (HOSPITAL_COMMUNITY): Payer: Self-pay

## 2018-05-24 ENCOUNTER — Emergency Department (HOSPITAL_COMMUNITY)
Admission: EM | Admit: 2018-05-24 | Discharge: 2018-05-24 | Disposition: A | Discharge status: Home / Self Care | Payer: Medicaid Other | Attending: Emergency Medicine | Admitting: Emergency Medicine

## 2018-05-24 ENCOUNTER — Other Ambulatory Visit: Payer: Self-pay

## 2018-05-24 DIAGNOSIS — Z79899 Other long term (current) drug therapy: Secondary | ICD-10-CM | POA: Insufficient documentation

## 2018-05-24 DIAGNOSIS — R519 Headache, unspecified: Secondary | ICD-10-CM

## 2018-05-24 DIAGNOSIS — R51 Headache: Secondary | ICD-10-CM | POA: Insufficient documentation

## 2018-05-24 DIAGNOSIS — R109 Unspecified abdominal pain: Secondary | ICD-10-CM

## 2018-05-24 DIAGNOSIS — R1084 Generalized abdominal pain: Secondary | ICD-10-CM | POA: Insufficient documentation

## 2018-05-24 LAB — URINALYSIS, ROUTINE W REFLEX MICROSCOPIC
Bacteria, UA: NONE SEEN
Bilirubin Urine: NEGATIVE
GLUCOSE, UA: NEGATIVE mg/dL
Ketones, ur: NEGATIVE mg/dL
Leukocytes, UA: NEGATIVE
Nitrite: NEGATIVE
PH: 6 (ref 5.0–8.0)
Protein, ur: NEGATIVE mg/dL
Specific Gravity, Urine: 1.01 (ref 1.005–1.030)

## 2018-05-24 LAB — POC URINE PREG, ED: PREG TEST UR: NEGATIVE

## 2018-05-24 MED ORDER — PROCHLORPERAZINE EDISYLATE 10 MG/2ML IJ SOLN
10.0000 mg | Freq: Once | INTRAMUSCULAR | Status: AC
  Administered 2018-05-24: 10 mg via INTRAVENOUS
  Filled 2018-05-24: qty 2

## 2018-05-24 MED ORDER — KETOROLAC TROMETHAMINE 15 MG/ML IJ SOLN
15.0000 mg | Freq: Once | INTRAMUSCULAR | Status: AC
  Administered 2018-05-24: 15 mg via INTRAVENOUS
  Filled 2018-05-24: qty 1

## 2018-05-24 MED ORDER — SODIUM CHLORIDE 0.9 % IV BOLUS
500.0000 mL | Freq: Once | INTRAVENOUS | Status: AC
  Administered 2018-05-24: 500 mL via INTRAVENOUS

## 2018-05-24 NOTE — Discharge Instructions (Addendum)
Please follow up with your primary care provider within 5-7 days for re-evaluation of your symptoms. If you do not have a primary care provider, information for a healthcare clinic has been provided for you to make arrangements for follow up care.  ° °Please return to the ER sooner if you have any new or worsening symptoms, or if you have any of the following symptoms: ° °Abdominal pain that does not go away.  °You have a fever.  °You keep throwing up (vomiting).  °The pain is felt only in portions of the abdomen. Pain in the right side could possibly be appendicitis. In an adult, pain in the left lower portion of the abdomen could be colitis or diverticulitis.  °You pass bloody or black tarry stools.  °There is bright red blood in the stool.  °The constipation stays for more than 4 days.  °There is belly (abdominal) or rectal pain.  °You do not seem to be getting better.  °You have any questions or concerns.  ° °

## 2018-05-24 NOTE — ED Notes (Signed)
Pt verbalized understanding of discharge paperwork and follow-up care.  °

## 2018-05-24 NOTE — ED Notes (Signed)
Pt aware of the need for urine sample  

## 2018-05-24 NOTE — ED Provider Notes (Signed)
[facility] EMERGENCY DEPARTMENT Provider Note   CSN: [id number] Arrival date & time: 05/24/18  0720     History   Chief Complaint Chief Complaint  Patient presents with  . Abdominal Pain  . Headache    HPI Lisa Reese is a 28 y.o. female.  HPI   Pt is a 28 y/o female with a h/o hyperemesis gravidarum who presents to the ED today for evaluation of a HA and abd pain.   Pt states that she has had a HA since last week. Sxs started gradually and seemed to worsen this morning. Sxs have been intermittent. HA located to the left side of the head currently, but has switched sides throughout the week. States pain is sharp. Rates pain as moderate. States that when she moves to quick to stand up she feels a dizzy sensation that lasts for a few seconds. No vision changes. No numbness/weakness. 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 No fevers or neck stiffness. She has had headaches in the past that are similar but this one has lasted longer.   Abdominal pain: States she has periumbilical abd pain that began last week. Sxs are intermittent. She currently has no pain. Pain feels sharp/pinching/uncomfortable. When she experiences pain she states it is severe and lasts for about -5 . She denies any particular exacerbating or alleviating factors. No NVD, constipation, urinary sxs, or vaginal complaints.  Past Medical History:  Diagnosis Date  . Hyperemesis gravidarum     Patient Active Problem List   Diagnosis Date Noted  . Hemorrhoids 07/27/2017  . Third degree laceration of perineum, type 3c 03/22/2017  . Low grade squamous intraepith lesion on cytologic smear cervix (lgsil) 10/12/2016    Past Surgical History:  Procedure Laterality Date  . NO PAST SURGERIES       OB History    Gravida  1   Para  1   Term  1   Preterm      AB      Living  1     SAB      TAB      Ectopic      Multiple  0   Live Births  1            Home Medications    Prior to Admission medications   Medication Sig Start Date End Date Taking? Authorizing Provider  hydrocortisone (ANUSOL-HC) 2.5 % rectal cream Apply rectally 2 times daily 07/27/17   Raelyn Mora, CNM  ibuprofen (ADVIL,MOTRIN) 600 MG tablet Take 1 tablet (600 mg total) by mouth every 6 (six) hours. Patient not taking: Reported on 05/04/2017 03/24/17   Cresenzo-Dishmon, Scarlette Calico, CNM  Prenat w/o A Vit-FeFum-FePo-FA (CONCEPT OB) 130-92.4-1 MG CAPS Take 1 capsule by mouth daily. 11/09/16   Reva Bores, MD    Family History No family history on file.  Social History Social History   Tobacco Use  . Smoking status: Never Smoker  . Smokeless tobacco: Never Used  Substance Use Topics  . Alcohol use: No  . Drug use: No     Allergies   Patient has no known allergies.   Review of Systems Review of Systems  Constitutional: Negative for chills and fever.  HENT: Negative for congestion, ear pain, nosebleeds and sore throat.   Eyes: Negative for visual disturbance.  Respiratory: Negative for cough and shortness of breath.   Cardiovascular: Negative for chest pain.  Gastrointestinal:  Positive for abdominal pain (resolved). Negative for constipation, diarrhea, nausea and vomiting.  Genitourinary: Negative for dysuria, frequency and hematuria.  Musculoskeletal: Positive for neck pain. Negative for back pain and neck stiffness.       Bilat trapezius pain  Skin: Negative for color change and rash.  Neurological: Positive for dizziness (resolved) and headaches. Negative for weakness, light-headedness and numbness.  All other systems reviewed and are negative.    Physical Exam Updated Vital Signs BP 138/78   Pulse 76   Temp 97.6 F (36.4 C) (Oral)   Resp 15   Ht 5\' 2"  (1.575 m)   LMP 05/19/2018   SpO2 99%   BMI 26.34 kg/m   Physical Exam Vitals signs and nursing note reviewed.  Constitutional:       General: She is not in acute distress.    Appearance: She is well-developed.  HENT:     Head: Normocephalic and atraumatic.     Mouth/Throat:     Mouth: Mucous membranes are moist.  Eyes:     Extraocular Movements: Extraocular movements intact.     Conjunctiva/sclera: Conjunctivae normal.     Pupils: Pupils are equal, round, and reactive to light.     Comments: No nystagmus  Neck:     Musculoskeletal: Neck supple.  Cardiovascular:     Rate and Rhythm: Normal rate and regular rhythm.     Heart sounds: Normal heart sounds. No murmur.  Pulmonary:     Effort: Pulmonary effort is normal. No respiratory distress.     Breath sounds: Normal breath sounds. No stridor. No wheezing or rhonchi.  Abdominal:     General: Bowel sounds are normal.     Palpations: Abdomen is soft.     Tenderness: There is no abdominal tenderness. There is left CVA tenderness. There is no right CVA tenderness, guarding or rebound.  Musculoskeletal:     Comments: TTP to the bilat trapezius muscles and to the bilat cervical paraspinous muscles.  Skin:    General: Skin is warm and dry.  Neurological:     Mental Status: She is alert.     Comments: Mental Status:  Alert, thought content appropriate, able to give a coherent history. Speech fluent without evidence of aphasia. Able to follow 2 step commands without difficulty.  Cranial Nerves:  II:  pupils equal, round, reactive to light III,IV, VI: ptosis not present, extra-ocular motions intact bilaterally  V,VII: smile symmetric, facial light touch sensation equal VIII: hearing grossly normal to voice  X: uvula elevates symmetrically  XI: bilateral shoulder shrug symmetric and strong XII: midline tongue extension without fassiculations Motor:  Normal tone. 5/5 strength of BUE and BLE major muscle groups including strong and equal grip strength and dorsiflexion/plantar flexion Sensory: light touch normal in all extremities. Cerebellar: normal finger-to-nose with  bilateral upper extremities Gait: normal gait and balance. Able to walk on toes and heels with ease.  CV: 2+ radial and DP/PT pulses  Psychiatric:        Mood and Affect: Mood normal.      ED Treatments / Results  Labs (all labs ordered are listed, but only abnormal results are displayed) Labs Reviewed  URINALYSIS, ROUTINE W REFLEX MICROSCOPIC - Abnormal; Notable for the following components:      Result Value   Color, Urine STRAW (*)    Hgb urine dipstick SMALL (*)    All other components within normal limits  POC URINE PREG, ED    EKG None  Radiology No results  found.  Procedures Procedures (including critical care time)  Medications Ordered in ED Medications  ketorolac (TORADOL) 15 MG/ML injection 15 mg (15 mg Intravenous Given 05/24/18 0818)  prochlorperazine (COMPAZINE) injection 10 mg (10 mg Intravenous Given 05/24/18 0818)  sodium chloride 0.9 % bolus 500 mL (0 mLs Intravenous Stopped 05/24/18 0851)     Initial Impression / Assessment and Plan / ED Course  I have reviewed the triage vital signs and the nursing notes.  Pertinent labs & imaging results that were available during my care of the patient were reviewed by me and considered in my medical decision making (see chart for details).     Final Clinical Impressions(s) / ED Diagnoses   Final diagnoses:  Acute nonintractable headache, unspecified headache type  Abdominal pain, unspecified abdominal location   Patient with multiple complaints including headache and abdominal pain.  Very well-appearing.  Vital signs are normal.  Pt HA treated and improved while in ED.  Presentation is non concerning for [facility], ICH, Meningitis, or temporal arteritis. Pt is afebrile with no focal neuro deficits, nuchal rigidity, or change in vision.  She reports associated bilateral trapezius pain and neck pain which is reproducible on exam.  I question a component of tension headache with her associated muscle pain.  Advised Tylenol,  Motrin and stretching at home.   With regard to abdominal pain.  Patient has no current abdominal pain at this time.  Abdomen is soft and nontender.  She has mild left CVA tenderness but has no other GI or GU complaints.  Her pregnancy test is negative.  Her urinalysis shows no evidence of UTI. Also is without hematuria and therefore CVA TTP less likely related to stone disease. Suspect may be secondary to MSK cause.  Given that she is currently asymptomatic at this time do not feel that lab work or imaging would be beneficial as she has a nonsurgical abdomen.  Have advised her to follow-up with her PCP if symptoms continue and if symptoms worsen she should return to the emergency department immediately.  She voices understanding of the plan and reasons to return to the ED.  All questions answered.  Patient stable for discharge.  ED Discharge Orders    None       Rayne DuCouture, Sheneka Schrom S, PA-C 05/24/18 0908    Charlynne PanderYao, David Hsienta, MD 05/24/18 [phone]

## 2018-05-24 NOTE — ED Triage Notes (Signed)
Pt c/o headache and abdominal pain since last week and it started back up yesterday. Denies N/V or diarrhea.

## 2018-08-08 ENCOUNTER — Inpatient Hospital Stay (HOSPITAL_COMMUNITY)
Admission: AD | Admit: 2018-08-08 | Discharge: 2018-08-08 | Disposition: A | Discharge status: Home / Self Care | Payer: Medicaid Other | Attending: Obstetrics and Gynecology | Admitting: Obstetrics and Gynecology

## 2018-08-08 ENCOUNTER — Other Ambulatory Visit: Payer: Self-pay

## 2018-08-08 DIAGNOSIS — R102 Pelvic and perineal pain: Secondary | ICD-10-CM | POA: Insufficient documentation

## 2018-08-08 DIAGNOSIS — Z3202 Encounter for pregnancy test, result negative: Secondary | ICD-10-CM | POA: Insufficient documentation

## 2018-08-08 LAB — POCT PREGNANCY, URINE: Preg Test, Ur: NEGATIVE

## 2018-08-08 NOTE — MAU Note (Signed)
Pt presents to MAU with complaints of pelvic pain and lower abdominal pain for a couple of weeks. Denies any vaginal bleeding

## 2018-08-08 NOTE — MAU Provider Note (Signed)
First Provider Initiated Contact with Patient 08/08/18 1314      S Ms. Lisa Reese is a 28 y.o. G1P1001 non-pregnant female who presents to MAU today with complaint of pelvic pain.   O BP 140/67   Pulse 94   Temp 97.8 F (36.6 C)   Resp 16   Ht 5\' 3"  (1.6 m)   Wt 75.3 kg   LMP 07/15/2018   BMI 29.41 kg/m  Physical Exam  Constitutional: She is oriented to person, place, and time. She appears well-developed and well-nourished. No distress.  HENT:  Head: Normocephalic and atraumatic.  Respiratory: Effort normal.  Neurological: She is alert and oriented to person, place, and time.  Skin: She is not diaphoretic.  Psychiatric: She has a normal mood and affect. Her behavior is normal.    A Non pregnant female Medical screening exam complete  P Discharge from MAU in stable condition Patient given the option of transfer to [facility] for further evaluation or seek care in outpatient facility of choice List of options for follow-up given  Warning signs for worsening condition that would warrant emergency follow-up discussed Patient may return to MAU as needed for pregnancy related complaints  Nugent, Odie Sera, NP 08/08/2018 1:18 PM

## 2018-08-09 ENCOUNTER — Encounter (HOSPITAL_COMMUNITY): Payer: Self-pay | Admitting: Emergency Medicine

## 2018-08-09 ENCOUNTER — Telehealth (HOSPITAL_COMMUNITY): Payer: Self-pay | Admitting: Emergency Medicine

## 2018-08-09 ENCOUNTER — Other Ambulatory Visit: Payer: Self-pay

## 2018-08-09 ENCOUNTER — Ambulatory Visit (HOSPITAL_COMMUNITY)
Admission: EM | Admit: 2018-08-09 | Discharge: 2018-08-09 | Disposition: A | Discharge status: Home / Self Care | Payer: Medicaid Other | Attending: Internal Medicine | Admitting: Internal Medicine

## 2018-08-09 ENCOUNTER — Ambulatory Visit (HOSPITAL_COMMUNITY)
Admission: RE | Admit: 2018-08-09 | Discharge: 2018-08-09 | Disposition: A | Discharge status: Home / Self Care | Payer: Self-pay | Source: Ambulatory Visit | Attending: Internal Medicine | Admitting: Internal Medicine

## 2018-08-09 DIAGNOSIS — R1013 Epigastric pain: Secondary | ICD-10-CM | POA: Insufficient documentation

## 2018-08-09 DIAGNOSIS — R102 Pelvic and perineal pain: Secondary | ICD-10-CM

## 2018-08-09 DIAGNOSIS — R1030 Lower abdominal pain, unspecified: Secondary | ICD-10-CM

## 2018-08-09 LAB — POCT URINALYSIS DIP (DEVICE)
Bilirubin Urine: NEGATIVE
Glucose, UA: NEGATIVE mg/dL
Ketones, ur: NEGATIVE mg/dL
Leukocytes,Ua: NEGATIVE
Nitrite: NEGATIVE
Protein, ur: NEGATIVE mg/dL
Specific Gravity, Urine: 1.02 (ref 1.005–1.030)
Urobilinogen, UA: 0.2 mg/dL (ref 0.0–1.0)
pH: 6.5 (ref 5.0–8.0)

## 2018-08-09 MED ORDER — PANTOPRAZOLE SODIUM 20 MG PO TBEC: 20.0000 mg | DELAYED_RELEASE_TABLET | Freq: Every day | ORAL | 0 refills | Status: DC

## 2018-08-09 NOTE — ED Provider Notes (Signed)
[facility]    CSN: [id number] Arrival date & time: 08/09/18  0857     History   Chief Complaint Chief Complaint  Patient presents with  . Abdominal Pain    HPI Lisa Reese is a 28 y.o. female with no past medical history comes to urgent care with complaints of few weeks history of abdominal pain.  Pain started insidiously and is gotten progressively worse.  Patient describes epigastric pain which is of mild to moderate intensity, worsened at nighttime and also with food.   Patient has some relief with Tums.  No nausea or vomiting.  Patient also describes lower abdominal pain which is intermittent in nature.  Aggravated by sexual activity.  Deep dyspareunia.  No relieving factors.  Patient denies any dysuria urgency or frequency.  Last menstrual period was normal on July 19, 2018.  No recent pelvic/vaginal procedures.  No vaginal discharge.  No history of STDs.  Past Medical History:  Diagnosis Date  . Hyperemesis gravidarum     Patient Active Problem List   Diagnosis Date Noted  . Hemorrhoids 07/27/2017  . Third degree laceration of perineum, type 3c 03/22/2017  . Low grade squamous intraepith lesion on cytologic smear cervix (lgsil) 10/12/2016    Past Surgical History:  Procedure Laterality Date  . NO PAST SURGERIES      OB History    Gravida  1   Para  1   Term  1   Preterm      AB      Living  1     SAB      TAB      Ectopic      Multiple  0   Live Births  1            Home Medications    Prior to Admission medications   Medication Sig Start Date End Date Taking? Authorizing Provider  hydrocortisone (ANUSOL-HC) 2.5 % rectal cream Apply rectally 2 times daily 07/27/17   Raelyn Mora, CNM  pantoprazole (PROTONIX) 20 MG tablet Take 1 tablet (20 mg total) by mouth daily for 30 days. 08/09/18 09/08/18  Merrilee Jansky, MD  Prenat w/o A Vit-FeFum-FePo-FA (CONCEPT OB) 130-92.4-1 MG CAPS Take 1 capsule by mouth daily. 11/09/16    Reva Bores, MD    Family History History reviewed. No pertinent family history.  Social History Social History   Tobacco Use  . Smoking status: Never Smoker  . Smokeless tobacco: Never Used  Substance Use Topics  . Alcohol use: No  . Drug use: No     Allergies   Patient has no known allergies.   Review of Systems Review of Systems  Constitutional: Negative.  Negative for activity change.  HENT: Negative.   Eyes: Negative.   Respiratory: Negative.   Cardiovascular: Negative.   Gastrointestinal: Positive for abdominal pain and nausea. Negative for abdominal distention, blood in stool, constipation, diarrhea and vomiting.  Endocrine: Negative.   Genitourinary: Positive for dyspareunia. Negative for dysuria, frequency and urgency.  Musculoskeletal: Negative.   Skin: Negative.   Allergic/Immunologic: Negative.   Neurological: Negative.      Physical Exam Triage Vital Signs ED Triage Vitals  Enc Vitals Group     BP 08/09/18 0912 119/78     Pulse Rate 08/09/18 0912 78     Resp 08/09/18 0912 16     Temp 08/09/18 0912 98.9 F (37.2 C)     Temp Source 08/09/18 0912 Oral  SpO2 08/09/18 0912 98 %     Weight --      Height --      Head Circumference --      Peak Flow --      Pain Score 08/09/18 0913 7     Pain Loc --      Pain Edu? --      Excl. in GC? --    No data found.  Updated Vital Signs BP 119/78 (BP Location: Left Arm)   Pulse 78   Temp 98.9 F (37.2 C) (Oral)   Resp 16   LMP 07/15/2018   SpO2 98%   Visual Acuity Right Eye Distance:   Left Eye Distance:   Bilateral Distance:    Right Eye Near:   Left Eye Near:    Bilateral Near:     Physical Exam Vitals signs and nursing note reviewed.  Constitutional:      Appearance: She is well-developed.  Cardiovascular:     Rate and Rhythm: Normal rate and regular rhythm.  Pulmonary:     Effort: Pulmonary effort is normal.     Breath sounds: Normal breath sounds.  Abdominal:     General:  Abdomen is flat. There is no distension or abdominal bruit.     Tenderness: There is abdominal tenderness in the epigastric area and suprapubic area. There is no guarding. Negative signs include Murphy's sign, Rovsing's sign and McBurney's sign.     Hernia: No hernia is present.  Skin:    General: Skin is warm.     Capillary Refill: Capillary refill takes less than 2 seconds.     Coloration: Skin is not pale.     Findings: No erythema.  Neurological:     General: No focal deficit present.     Mental Status: She is alert.      UC Treatments / Results  Labs (all labs ordered are listed, but only abnormal results are displayed) Labs Reviewed  POCT URINALYSIS DIP (DEVICE) - Abnormal; Notable for the following components:      Result Value   Hgb urine dipstick TRACE (*)    All other components within normal limits  URINE CYTOLOGY ANCILLARY ONLY    EKG None  Radiology No results found.  Procedures Procedures (including critical care time)  Medications Ordered in UC Medications - No data to display  Initial Impression / Assessment and Plan / UC Course  I have reviewed the triage vital signs and the nursing notes.  Pertinent labs & imaging results that were available during my care of the patient were reviewed by me and considered in my medical decision making (see chart for details).     1.  Abdominal pain, epigastric: Protonix 20 mg orally daily for 30 days If no improvement, patient may need a gastroenterology evaluation.  2.  Pelvic pain: Urinalysis is negative for urinary tract infection Urine cytology for GC chlamydia Complete pelvic ultrasound. Final Clinical Impressions(s) / UC Diagnoses   Final diagnoses:  Epigastric pain  Pelvic pain   Discharge Instructions   None    ED Prescriptions    Medication Sig Dispense Auth. Provider   pantoprazole (PROTONIX) 20 MG tablet Take 1 tablet (20 mg total) by mouth daily for 30 days. 30 tablet Gurjit Loconte, Britta MccreedyPhilip O, MD      Controlled Substance Prescriptions [place] Controlled Substance Registry consulted? No   Merrilee JanskyLamptey, Peachie Barkalow O, MD 08/09/18 1015

## 2018-08-09 NOTE — ED Triage Notes (Signed)
Pt presents to [facility] for assessment of generalized abdominal pain, starting a few months ago.  Patient states pain gets worse when she has vaginal intercourse.  Denies n/v/d.  C/o some constipation.

## 2018-08-09 NOTE — Telephone Encounter (Signed)
Normal [place], Patient contacted and made aware of all results, all questions answered.

## 2018-08-09 NOTE — ED Notes (Signed)
Patient able to ambulate independently  

## 2018-08-10 LAB — URINE CYTOLOGY ANCILLARY ONLY
Chlamydia: NEGATIVE
Neisseria Gonorrhea: NEGATIVE

## 2018-11-03 ENCOUNTER — Other Ambulatory Visit: Payer: Self-pay

## 2018-11-03 ENCOUNTER — Emergency Department (HOSPITAL_COMMUNITY)
Admission: EM | Admit: 2018-11-03 | Discharge: 2018-11-03 | Disposition: A | Discharge status: Home / Self Care | Payer: Medicaid Other | Attending: Emergency Medicine | Admitting: Emergency Medicine

## 2018-11-03 ENCOUNTER — Encounter (HOSPITAL_COMMUNITY): Payer: Self-pay | Admitting: Emergency Medicine

## 2018-11-03 DIAGNOSIS — R109 Unspecified abdominal pain: Secondary | ICD-10-CM

## 2018-11-03 DIAGNOSIS — R531 Weakness: Secondary | ICD-10-CM | POA: Insufficient documentation

## 2018-11-03 DIAGNOSIS — R0602 Shortness of breath: Secondary | ICD-10-CM | POA: Insufficient documentation

## 2018-11-03 LAB — COMPREHENSIVE METABOLIC PANEL
ALT: 17 U/L (ref 0–44)
AST: 24 U/L (ref 15–41)
Albumin: 4 g/dL (ref 3.5–5.0)
Alkaline Phosphatase: 46 U/L (ref 38–126)
Anion gap: 7 (ref 5–15)
BUN: 10 mg/dL (ref 6–20)
CO2: 25 mmol/L (ref 22–32)
Calcium: 9.3 mg/dL (ref 8.9–10.3)
Chloride: 105 mmol/L (ref 98–111)
Creatinine, Ser: 0.81 mg/dL (ref 0.44–1.00)
GFR calc Af Amer: >60 mL/min (ref >60)
GFR calc non Af Amer: >60 mL/min (ref >60)
Glucose, Bld: 95 mg/dL (ref 70–99)
Potassium: 3.6 mmol/L (ref 3.5–5.1)
Sodium: 137 mmol/L (ref 135–145)
Total Bilirubin: 0.4 mg/dL (ref 0.3–1.2)
Total Protein: 8.1 g/dL (ref 6.5–8.1)

## 2018-11-03 LAB — URINALYSIS, ROUTINE W REFLEX MICROSCOPIC
Bacteria, UA: NONE SEEN
Bilirubin Urine: NEGATIVE
Glucose, UA: NEGATIVE mg/dL
Ketones, ur: NEGATIVE mg/dL
Leukocytes,Ua: NEGATIVE
Nitrite: NEGATIVE
Protein, ur: NEGATIVE mg/dL
RBC / HPF: >50 RBC/hpf — ABNORMAL HIGH (ref 0–5)
Specific Gravity, Urine: 1.011 (ref 1.005–1.030)
pH: 8 (ref 5.0–8.0)

## 2018-11-03 LAB — CBC
HCT: 38.3 % (ref 36.0–46.0)
Hemoglobin: 12.5 g/dL (ref 12.0–15.0)
MCH: 29.9 pg (ref 26.0–34.0)
MCHC: 32.6 g/dL (ref 30.0–36.0)
MCV: 91.6 fL (ref 80.0–100.0)
Platelets: 314 10*3/uL (ref 150–400)
RBC: 4.18 MIL/uL (ref 3.87–5.11)
RDW: 12 % (ref 11.5–15.5)
WBC: 5.1 10*3/uL (ref 4.0–10.5)
nRBC: 0 % (ref 0.0–0.2)

## 2018-11-03 LAB — D-DIMER, QUANTITATIVE: D-Dimer, Quant: 0.46 ug/mL-FEU (ref 0.00–0.50)

## 2018-11-03 LAB — POC OCCULT BLOOD, ED: Fecal Occult Bld: NEGATIVE

## 2018-11-03 LAB — PREGNANCY, URINE: Preg Test, Ur: NEGATIVE

## 2018-11-03 MED ORDER — PANTOPRAZOLE SODIUM 20 MG PO TBEC: 20.0000 mg | DELAYED_RELEASE_TABLET | Freq: Every day | ORAL | 0 refills | Status: DC

## 2018-11-03 MED ORDER — SODIUM CHLORIDE 0.9 % IV BOLUS
1000.0000 mL | Freq: Once | INTRAVENOUS | Status: AC
  Administered 2018-11-03: 1000 mL via INTRAVENOUS

## 2018-11-03 NOTE — ED Triage Notes (Signed)
Patient is says she is extremely weak, having abdominal pain, soft BM's that are pink in color.   She said she has been drinking beet juice and does not know if that is what is causing her BM's to be this color.  She has been seen at [facility] for abdominal pain and acid reflux.  She is pain free at this time. However, she did say she got extremely tired walking from the parking deck to the ED.

## 2018-11-03 NOTE — Discharge Instructions (Addendum)
You have been seen today for abdominal pain and shortness of breath. Please read and follow all provided instructions. Return to the emergency room for worsening condition or new concerning symptoms.    1. Medications:  Prescription sent to your pharmacy for Protonix. This is the medicine you were taking in the past for your belly pain.  Continue usual home medications Take medications as prescribed. Please review all of the medicines and only take them if you do not have an allergy to them.   2. Treatment: rest, drink plenty of fluids  3. Follow Up: Please follow up with your primary doctor in 2-5 days for discussion of your diagnoses and further evaluation after today's visit; Call today to arrange your follow up.  If you do not have a primary care doctor use the resource guide provided to find one;  -I have included information for [facility].  You can call to schedule an outpatient appointment trying to establish care. -I have also included information for [facility].  It is also a possibility that you have an allergic reaction to any of the medicines that you have been prescribed - Everybody reacts differently to medications and while MOST people have no trouble with most medicines, you may have a reaction such as nausea, vomiting, rash, swelling, shortness of breath. If this is the case, please stop taking the medicine immediately and contact your physician.  ?

## 2018-11-03 NOTE — ED Provider Notes (Signed)
[facility] Provider Note   CSN: [id number] Arrival date & time: 11/03/18  1540    History   Chief Complaint Chief Complaint  Patient presents with  . Abdominal Pain    HPI Lisa Reese is a 28 y.o. female since to emergency department today with chief complaint of abdominal pain and generalized weakness.  Patient states she has chronic abdominal pain.  She went to urgent care in 07/2018 and was given trial of Protonix for acid reflux.  She states that helped with her abdominal pain however she ran out and has been without for 1 month.  She denies any actual abdominal pain today.  However she does report she has had soft pink stool x 1 day.  She has been drinking beet juice for a week and thinks this might be related.   She reports generalized weakness x2 weeks with associated shortness of breath.  When walking from the parking deck into the emergency department today she got extremely tired and felt winded.  Denies fever, chills, nausea, exogenous estrogen use, lower extremity pain or swelling, recent travel or immobilization, history of PE or DVT, family or personal history of bleeding or clotting disorders, cough or hemoptysis   Past Medical History:  Diagnosis Date  . Hyperemesis gravidarum     Patient Active Problem List   Diagnosis Date Noted  . Hemorrhoids 07/27/2017  . Third degree laceration of perineum, type 3c 03/22/2017  . Low grade squamous intraepith lesion on cytologic smear cervix (lgsil) 10/12/2016    Past Surgical History:  Procedure Laterality Date  . NO PAST SURGERIES       OB History    Gravida  1   Para  1   Term  1   Preterm      AB      Living  1     SAB      TAB      Ectopic      Multiple  0   Live Births  1            Home Medications    Prior to Admission medications   Medication Sig Start Date End Date Taking? Authorizing Provider  hydrocortisone (ANUSOL-HC) 2.5 %  rectal cream Apply rectally 2 times daily 07/27/17   Raelyn Moraawson, Rolitta, CNM  pantoprazole (PROTONIX) 20 MG tablet Take 1 tablet (20 mg total) by mouth daily. 11/03/18 12/03/18  Josephyne Tarter E, PA-C  Prenat w/o A Vit-FeFum-FePo-FA (CONCEPT OB) 130-92.4-1 MG CAPS Take 1 capsule by mouth daily. 11/09/16   Reva BoresPratt, Tanya S, MD    Family History History reviewed. No pertinent family history.  Social History Social History   Tobacco Use  . Smoking status: Never Smoker  . Smokeless tobacco: Never Used  Substance Use Topics  . Alcohol use: No  . Drug use: No     Allergies   Patient has no known allergies.   Review of Systems Review of Systems  Constitutional: Negative for chills and fever.  HENT: Negative for congestion, ear discharge, ear pain, sinus pressure, sinus pain and sore throat.   Eyes: Negative for pain and redness.  Respiratory: Positive for shortness of breath. Negative for cough.   Cardiovascular: Negative for chest pain.  Gastrointestinal: Positive for diarrhea. Negative for abdominal pain, constipation, nausea and vomiting.  Genitourinary: Negative for dysuria and hematuria.  Musculoskeletal: Negative for back pain and neck pain.  Skin: Negative for wound.  Neurological: Positive for weakness. Negative for  numbness and headaches.     Physical Exam Updated Vital Signs BP 136/80 (BP Location: Right Arm)   Pulse (!) 102   Temp 98 F (36.7 C) (Oral)   Resp 16   Ht 5\' 2"  (1.575 m)   Wt 78.5 kg   LMP 11/03/2018   SpO2 100%   Breastfeeding No   BMI 31.64 kg/m   Physical Exam Vitals signs and nursing note reviewed.  Constitutional:      General: She is not in acute distress.    Appearance: She is not ill-appearing.  HENT:     Head: Normocephalic and atraumatic.     Right Ear: Tympanic membrane and external ear normal.     Left Ear: Tympanic membrane and external ear normal.     Nose: Nose normal.     Mouth/Throat:     Mouth: Mucous membranes are moist.      Pharynx: Oropharynx is clear.  Eyes:     General: No scleral icterus.       Right eye: No discharge.        Left eye: No discharge.     Extraocular Movements: Extraocular movements intact.     Conjunctiva/sclera: Conjunctivae normal.     Pupils: Pupils are equal, round, and reactive to light.  Neck:     Musculoskeletal: Normal range of motion.     Vascular: No JVD.  Cardiovascular:     Rate and Rhythm: Regular rhythm. Tachycardia present.     Pulses: Normal pulses.          Radial pulses are 2+ on the right side and 2+ on the left side.     Heart sounds: Normal heart sounds.  Pulmonary:     Comments: Lungs clear to auscultation in all fields. Symmetric chest rise. No wheezing, rales, or rhonchi. Abdominal:     Comments: Abdomen is soft, non-distended, and non-tender in all quadrants. No rigidity, no guarding. No peritoneal signs.  Genitourinary:    Comments: Chaperone present for exam. Digital Rectal Exam reveals sphincter with good tone.  Multiple small external hemorrhoids without signs of thrombosis. No masses or fissures. Stool color is brown with no overt blood. No gross melena.  Musculoskeletal: Normal range of motion.  Skin:    General: Skin is warm and dry.     Capillary Refill: Capillary refill takes less than 2 seconds.  Neurological:     Mental Status: She is oriented to person, place, and time.     GCS: GCS eye subscore is 4. GCS verbal subscore is 5. GCS motor subscore is 6.     Comments: Fluent speech, no facial droop.  Psychiatric:        Behavior: Behavior normal.      ED Treatments / Results  Labs (all labs ordered are listed, but only abnormal results are displayed) Labs Reviewed  URINALYSIS, ROUTINE W REFLEX MICROSCOPIC - Abnormal; Notable for the following components:      Result Value   Hgb urine dipstick LARGE (*)    RBC / HPF >50 (*)    All other components within normal limits  COMPREHENSIVE METABOLIC PANEL  CBC  D-DIMER, QUANTITATIVE (NOT AT  [facility])  PREGNANCY, URINE  POC OCCULT BLOOD, ED  I-STAT BETA HCG BLOOD, ED ([facility], [facility], [facility] ONLY)    EKG None  Radiology No results found.  Procedures Procedures (including critical care time)  Medications Ordered in ED Medications  sodium chloride 0.9 % bolus 1,000 mL (1,000 mLs Intravenous New Bag/Given 11/03/18  1633)     Initial Impression / Assessment and Plan / ED Course  I have reviewed the triage vital signs and the nursing notes.  Pertinent labs & imaging results that were available during my care of the patient were reviewed by me and considered in my medical decision making (see chart for details).  On arrival patient is nontoxic in appearance. She is afebrile, normotensive, tachycardic to 102.  SpO2 was 100% on room air.  Lungs are clear to auscultation in all fields. Abdomen is nontender, no peritoneal signs. Rectal exam with external hemorrhoids, no gross melena.  UA with large hemoglobin and >50 RBC, consistent with her being on menses cycle. CBC unremarkable, no leukocytosis, stable hemoglobin 12.5. CMP also unremarkable, no electrolyte abnormalities. Lipase within normal range. Fecal occult negative.  D-dimer is negative, making PE unlikely cause of dyspnea.   On reassessment patient reports feeling much better after IV fluids.  Tachycardia has improved, HR ranging from 81-92. She is tolerating PO intake. Patient is hemodynamically stable, in NAD, and able to ambulate in the ED. Evaluation does not show pathology that would require ongoing emergent intervention or inpatient treatment. I explained the diagnosis to the patient.  Will discharge with prescription for Protonix for symptomatic treatment. Patient is comfortable with above plan and is stable for discharge at this time. All questions were answered prior to disposition.  Strict return precautions for returning to the ED were discussed. Encouraged follow up with PCP and GI for chronic abdominal pain. Provided patient with  a list of clinic resources to use if they do not have a PCP. Instructed to call them today to arrange follow-up in the next 24-48 hours.   This note was prepared using Dragon voice recognition software and may include unintentional dictation errors due to the inherent limitations of voice recognition software.    Final Clinical Impressions(s) / ED Diagnoses   Final diagnoses:  Abdominal pain, unspecified abdominal location    ED Discharge Orders         Ordered    pantoprazole (PROTONIX) 20 MG tablet  Daily     11/03/18 1832           Kathyrn Lasslbrizze, Rodolfo Notaro E, PA-C 11/03/18 1852    Jacalyn LefevreHaviland, Julie, MD 11/03/18 2230

## 2019-06-28 ENCOUNTER — Encounter (HOSPITAL_COMMUNITY): Payer: Self-pay | Admitting: Emergency Medicine

## 2019-06-28 ENCOUNTER — Other Ambulatory Visit: Payer: Self-pay

## 2019-06-28 ENCOUNTER — Emergency Department (HOSPITAL_COMMUNITY)
Admission: EM | Admit: 2019-06-28 | Discharge: 2019-06-28 | Disposition: A | Discharge status: Home / Self Care | Payer: 59 | Attending: Emergency Medicine | Admitting: Emergency Medicine

## 2019-06-28 DIAGNOSIS — Z79899 Other long term (current) drug therapy: Secondary | ICD-10-CM | POA: Diagnosis not present

## 2019-06-28 DIAGNOSIS — R1010 Upper abdominal pain, unspecified: Secondary | ICD-10-CM | POA: Diagnosis not present

## 2019-06-28 DIAGNOSIS — R1033 Periumbilical pain: Secondary | ICD-10-CM | POA: Diagnosis not present

## 2019-06-28 DIAGNOSIS — R42 Dizziness and giddiness: Secondary | ICD-10-CM | POA: Insufficient documentation

## 2019-06-28 DIAGNOSIS — R531 Weakness: Secondary | ICD-10-CM | POA: Diagnosis not present

## 2019-06-28 DIAGNOSIS — R112 Nausea with vomiting, unspecified: Secondary | ICD-10-CM | POA: Insufficient documentation

## 2019-06-28 LAB — CBC WITH DIFFERENTIAL/PLATELET
Abs Immature Granulocytes: 0.01 10*3/uL (ref 0.00–0.07)
Basophils Absolute: 0 10*3/uL (ref 0.0–0.1)
Basophils Relative: 0 %
Eosinophils Absolute: 0 10*3/uL (ref 0.0–0.5)
Eosinophils Relative: 1 %
HCT: 37.3 % (ref 36.0–46.0)
Hemoglobin: 12.3 g/dL (ref 12.0–15.0)
Immature Granulocytes: 0 %
Lymphocytes Relative: 22 %
Lymphs Abs: 1 10*3/uL (ref 0.7–4.0)
MCH: 30 pg (ref 26.0–34.0)
MCHC: 33 g/dL (ref 30.0–36.0)
MCV: 91 fL (ref 80.0–100.0)
Monocytes Absolute: 0.3 10*3/uL (ref 0.1–1.0)
Monocytes Relative: 7 %
Neutro Abs: 3.1 10*3/uL (ref 1.7–7.7)
Neutrophils Relative %: 70 %
Platelets: 335 10*3/uL (ref 150–400)
RBC: 4.1 MIL/uL (ref 3.87–5.11)
RDW: 11.8 % (ref 11.5–15.5)
WBC: 4.4 10*3/uL (ref 4.0–10.5)
nRBC: 0 % (ref 0.0–0.2)

## 2019-06-28 LAB — COMPREHENSIVE METABOLIC PANEL
ALT: 13 U/L (ref 0–44)
AST: 20 U/L (ref 15–41)
Albumin: 3.8 g/dL (ref 3.5–5.0)
Alkaline Phosphatase: 45 U/L (ref 38–126)
Anion gap: 7 (ref 5–15)
BUN: 12 mg/dL (ref 6–20)
CO2: 25 mmol/L (ref 22–32)
Calcium: 9.4 mg/dL (ref 8.9–10.3)
Chloride: 106 mmol/L (ref 98–111)
Creatinine, Ser: 0.89 mg/dL (ref 0.44–1.00)
GFR calc Af Amer: >60 mL/min (ref >60)
GFR calc non Af Amer: >60 mL/min (ref >60)
Glucose, Bld: 86 mg/dL (ref 70–99)
Potassium: 4.1 mmol/L (ref 3.5–5.1)
Sodium: 138 mmol/L (ref 135–145)
Total Bilirubin: 0.4 mg/dL (ref 0.3–1.2)
Total Protein: 7.8 g/dL (ref 6.5–8.1)

## 2019-06-28 LAB — URINALYSIS, ROUTINE W REFLEX MICROSCOPIC
Bilirubin Urine: NEGATIVE
Glucose, UA: NEGATIVE mg/dL
Hgb urine dipstick: NEGATIVE
Ketones, ur: NEGATIVE mg/dL
Leukocytes,Ua: NEGATIVE
Nitrite: NEGATIVE
Protein, ur: NEGATIVE mg/dL
Specific Gravity, Urine: 1.019 (ref 1.005–1.030)
pH: 6 (ref 5.0–8.0)

## 2019-06-28 LAB — RAPID URINE DRUG SCREEN, HOSP PERFORMED
Amphetamines: NOT DETECTED
Barbiturates: NOT DETECTED
Benzodiazepines: NOT DETECTED
Cocaine: NOT DETECTED
Opiates: NOT DETECTED
Tetrahydrocannabinol: POSITIVE — AB

## 2019-06-28 LAB — LIPASE, BLOOD: Lipase: 27 U/L (ref 11–51)

## 2019-06-28 LAB — POC URINE PREG, ED: Preg Test, Ur: NEGATIVE

## 2019-06-28 MED ORDER — SUCRALFATE 1 GM/10ML PO SUSP: 1.0000 g | Freq: Three times a day (TID) | ORAL | 0 refills | Status: DC

## 2019-06-28 MED ORDER — ONDANSETRON HCL 4 MG PO TABS: 4.0000 mg | ORAL_TABLET | Freq: Three times a day (TID) | ORAL | 0 refills | Status: DC | PRN

## 2019-06-28 MED ORDER — ONDANSETRON HCL 4 MG/2ML IJ SOLN
4.0000 mg | Freq: Once | INTRAMUSCULAR | Status: AC
  Administered 2019-06-28: 4 mg via INTRAVENOUS
  Filled 2019-06-28: qty 2

## 2019-06-28 MED ORDER — MORPHINE SULFATE (PF) 4 MG/ML IV SOLN
4.0000 mg | Freq: Once | INTRAVENOUS | Status: AC
  Administered 2019-06-28: 4 mg via INTRAVENOUS
  Filled 2019-06-28: qty 1

## 2019-06-28 MED ORDER — SODIUM CHLORIDE 0.9 % IV BOLUS
1000.0000 mL | Freq: Once | INTRAVENOUS | Status: AC
  Administered 2019-06-28: 10:00:00 1000 mL via INTRAVENOUS

## 2019-06-28 NOTE — ED Provider Notes (Signed)
[facility] EMERGENCY DEPARTMENT Provider Note   CSN: [id number] Arrival date & time: 06/28/19  7824     History Chief Complaint  Patient presents with  . Emesis    Lisa Reese is a 29 y.o. female.  The history is provided by the patient and medical records. No language interpreter was used.  Emesis    29 year old female presenting for evaluation of nausea vomiting..  Patient report she was awoke this morning feeling very nauseous, she vomited twice of food that she ate the night before.  She endorsed increased nausea when she lays flat and improves when she sits up.  She endorsed generalized weakness and lightheadedness.  She endorsed some mild upper mid abdominal cramping.  She denies any fever chills no chest pain shortness of breath or productive cough no back pain dysuria me hematuria vaginal bleeding or vaginal discharge.  Last menstrual period was a week ago.  She denies alcohol or tobacco use.  No specific treatment tried.  Past Medical History:  Diagnosis Date  . Hyperemesis gravidarum     Patient Active Problem List   Diagnosis Date Noted  . Hemorrhoids 07/27/2017  . Third degree laceration of perineum, type 3c 03/22/2017  . Low grade squamous intraepith lesion on cytologic smear cervix (lgsil) 10/12/2016    Past Surgical History:  Procedure Laterality Date  . NO PAST SURGERIES       OB History    Gravida  1   Para  1   Term  1   Preterm      AB      Living  1     SAB      TAB      Ectopic      Multiple  0   Live Births  1           No family history on file.  Social History   Tobacco Use  . Smoking status: Never Smoker  . Smokeless tobacco: Never Used  Substance Use Topics  . Alcohol use: No  . Drug use: No    Home Medications Prior to Admission medications   Medication Sig Start Date End Date Taking? Authorizing Provider  hydrocortisone (ANUSOL-HC) 2.5 % rectal cream Apply rectally 2 times daily  07/27/17   Laury Deep, CNM  pantoprazole (PROTONIX) 20 MG tablet Take 1 tablet (20 mg total) by mouth daily. 11/03/18 12/03/18  Albrizze, Kaitlyn E, PA-C  Prenat w/o A Vit-FeFum-FePo-FA (CONCEPT OB) 130-92.4-1 MG CAPS Take 1 capsule by mouth daily. 11/09/16   Donnamae Jude, MD    Allergies    Patient has no known allergies.  Review of Systems   Review of Systems  Gastrointestinal: Positive for vomiting.  All other systems reviewed and are negative.   Physical Exam Updated Vital Signs BP 116/72   Pulse 65   Temp 98.2 F (36.8 C) (Oral)   Resp 14   Ht 5' 2.4" (1.585 m)   Wt 72.6 kg   SpO2 100%   BMI 28.89 kg/m   Physical Exam Vitals and nursing note reviewed.  Constitutional:      General: She is not in acute distress.    Appearance: She is well-developed.  HENT:     Head: Atraumatic.  Eyes:     Conjunctiva/sclera: Conjunctivae normal.  Cardiovascular:     Rate and Rhythm: Normal rate and regular rhythm.     Pulses: Normal pulses.     Heart sounds: Normal heart sounds.  Pulmonary:  Effort: Pulmonary effort is normal.     Breath sounds: Normal breath sounds.  Abdominal:     Palpations: Abdomen is soft.     Tenderness: There is abdominal tenderness (Periumbilical tenderness on palpation without guarding or rebound tenderness.  Negative Murphy sign, no pain at McBurney's point.).  Musculoskeletal:     Cervical back: Neck supple.  Skin:    Findings: No rash.  Neurological:     Mental Status: She is alert.     ED Results / Procedures / Treatments   Labs (all labs ordered are listed, but only abnormal results are displayed) Labs Reviewed  RAPID URINE DRUG SCREEN, HOSP PERFORMED - Abnormal; Notable for the following components:      Result Value   Tetrahydrocannabinol POSITIVE (*)    All other components within normal limits  CBC WITH DIFFERENTIAL/PLATELET  COMPREHENSIVE METABOLIC PANEL  LIPASE, BLOOD  URINALYSIS, ROUTINE W REFLEX MICROSCOPIC  POC URINE  PREG, ED    EKG None  Radiology No results found.  Procedures Procedures (including critical care time)  Medications Ordered in ED Medications  morphine 4 MG/ML injection 4 mg (4 mg Intravenous Given 06/28/19 0937)  ondansetron (ZOFRAN) injection 4 mg (4 mg Intravenous Given 06/28/19 0937)  sodium chloride 0.9 % bolus 1,000 mL (0 mLs Intravenous Stopped 06/28/19 1136)    ED Course  I have reviewed the triage vital signs and the nursing notes.  Pertinent labs & imaging results that were available during my care of the patient were reviewed by me and considered in my medical decision making (see chart for details).    MDM Rules/Calculators/A&P                      BP 116/72   Pulse 65   Temp 98.2 F (36.8 C) (Oral)   Resp 14   Ht 5' 2.4" (1.585 m)   Wt 72.6 kg   SpO2 100%   BMI 28.89 kg/m   Final Clinical Impression(s) / ED Diagnoses Final diagnoses:  Non-intractable vomiting with nausea, unspecified vomiting type  Upper abdominal pain    Rx / DC Orders ED Discharge Orders         Ordered    ondansetron (ZOFRAN) 4 MG tablet  Every 8 hours PRN     06/28/19 1148    sucralfate (CARAFATE) 1 GM/10ML suspension  3 times daily with meals & bedtime     06/28/19 1148         8:57 AM Patient here with periumbilical abdominal tenderness with associate nausea and vomiting.  States that she tried to have a bowel movement this morning without any luck.  Bowel sounds present.  No prior history of abdominal surgery.  Low suspicion for SBO.  Work-up initiated.  11:42 AM Work-up has not been very unremarkable.  Pregnancy test is negative, UA without any signs of urinary tract infection, UDS positive for tetrahydrocannabinol, white count is normal, normal WBC, normal H&H,, normal electrolyte panel, normal lipase, normal orthostatic vital sign.  Patient reported improvement of symptoms after receiving IV fluid and symptomatic treatment.  Patient denies using marijuana.  At this  time she is stable for discharge.  Low suspicion for PE or other acute emergent medical condition.  Return precaution discussed.   Fayrene Helper, PA-C 06/28/19 1149    Tegeler, Canary Brim, MD 06/28/19 1214

## 2019-06-28 NOTE — ED Triage Notes (Signed)
Pt reports 2 episodes of emesis this morning, only when laying down. Also states that she is normally weak in the morning but today was worse than normal. Ambulatory on arrival.

## 2019-06-28 NOTE — ED Notes (Signed)
Patient verbalizes understanding of discharge instructions. Opportunity for questioning and answers were provided. Armband removed by staff, pt discharged from ED. Ambulated out to lobby  

## 2019-10-04 ENCOUNTER — Inpatient Hospital Stay (HOSPITAL_COMMUNITY): Payer: 59

## 2019-10-04 ENCOUNTER — Encounter (HOSPITAL_COMMUNITY): Payer: Self-pay | Admitting: Obstetrics & Gynecology

## 2019-10-04 ENCOUNTER — Inpatient Hospital Stay (HOSPITAL_COMMUNITY)
Admission: AD | Admit: 2019-10-04 | Discharge: 2019-10-04 | Disposition: A | Discharge status: Home / Self Care | Payer: 59 | Attending: Obstetrics & Gynecology | Admitting: Obstetrics & Gynecology

## 2019-10-04 ENCOUNTER — Other Ambulatory Visit: Payer: Self-pay

## 2019-10-04 DIAGNOSIS — R109 Unspecified abdominal pain: Secondary | ICD-10-CM

## 2019-10-04 DIAGNOSIS — M549 Dorsalgia, unspecified: Secondary | ICD-10-CM

## 2019-10-04 DIAGNOSIS — R110 Nausea: Secondary | ICD-10-CM | POA: Insufficient documentation

## 2019-10-04 DIAGNOSIS — O26899 Other specified pregnancy related conditions, unspecified trimester: Secondary | ICD-10-CM

## 2019-10-04 DIAGNOSIS — O26891 Other specified pregnancy related conditions, first trimester: Secondary | ICD-10-CM

## 2019-10-04 DIAGNOSIS — Z3490 Encounter for supervision of normal pregnancy, unspecified, unspecified trimester: Secondary | ICD-10-CM

## 2019-10-04 DIAGNOSIS — R102 Pelvic and perineal pain: Secondary | ICD-10-CM | POA: Diagnosis not present

## 2019-10-04 DIAGNOSIS — O468X1 Other antepartum hemorrhage, first trimester: Secondary | ICD-10-CM

## 2019-10-04 DIAGNOSIS — Z3A01 Less than 8 weeks gestation of pregnancy: Secondary | ICD-10-CM | POA: Diagnosis not present

## 2019-10-04 DIAGNOSIS — O99891 Other specified diseases and conditions complicating pregnancy: Secondary | ICD-10-CM | POA: Diagnosis present

## 2019-10-04 HISTORY — DX: Unspecified abnormal cytological findings in specimens from vagina: R87.629

## 2019-10-04 LAB — CBC
HCT: 34.5 % — ABNORMAL LOW (ref 36.0–46.0)
Hemoglobin: 11.4 g/dL — ABNORMAL LOW (ref 12.0–15.0)
MCH: 29.9 pg (ref 26.0–34.0)
MCHC: 33 g/dL (ref 30.0–36.0)
MCV: 90.6 fL (ref 80.0–100.0)
Platelets: 314 10*3/uL (ref 150–400)
RBC: 3.81 MIL/uL — ABNORMAL LOW (ref 3.87–5.11)
RDW: 12.2 % (ref 11.5–15.5)
WBC: 5 10*3/uL (ref 4.0–10.5)
nRBC: 0 % (ref 0.0–0.2)

## 2019-10-04 LAB — URINALYSIS, ROUTINE W REFLEX MICROSCOPIC
Bilirubin Urine: NEGATIVE
Glucose, UA: NEGATIVE mg/dL
Hgb urine dipstick: NEGATIVE
Ketones, ur: NEGATIVE mg/dL
Leukocytes,Ua: NEGATIVE
Nitrite: NEGATIVE
Protein, ur: 30 mg/dL — AB
Specific Gravity, Urine: 1.027 (ref 1.005–1.030)
pH: 7 (ref 5.0–8.0)

## 2019-10-04 LAB — WET PREP, GENITAL
Clue Cells Wet Prep HPF POC: NONE SEEN
Sperm: NONE SEEN
Trich, Wet Prep: NONE SEEN
Yeast Wet Prep HPF POC: NONE SEEN

## 2019-10-04 LAB — HCG, QUANTITATIVE, PREGNANCY: hCG, Beta Chain, Quant, S: 75666 m[IU]/mL — ABNORMAL HIGH (ref <5)

## 2019-10-04 LAB — ABO/RH: ABO/RH(D): O POS

## 2019-10-04 LAB — HIV ANTIBODY (ROUTINE TESTING W REFLEX): HIV Screen 4th Generation wRfx: NONREACTIVE

## 2019-10-04 MED ORDER — PROMETHAZINE HCL 25 MG/ML IJ SOLN
25.0000 mg | Freq: Once | INTRAMUSCULAR | Status: AC
  Administered 2019-10-04: 25 mg via INTRAMUSCULAR
  Filled 2019-10-04: qty 1

## 2019-10-04 MED ORDER — FAMOTIDINE 20 MG PO TABS
20.0000 mg | ORAL_TABLET | Freq: Once | ORAL | Status: AC
  Administered 2019-10-04: 20 mg via ORAL
  Filled 2019-10-04: qty 1

## 2019-10-04 NOTE — Discharge Instructions (Signed)

## 2019-10-04 NOTE — MAU Note (Signed)
Patient went to [facility] on Wednesday and had a positive pregnancy test. Patient complaining of nausea but no vomiting.Back pain that is keeping her from sleeping. Patient states she does not know how far along she is but missed her period last month.

## 2019-10-04 NOTE — MAU Provider Note (Signed)
History     CSN: [id number]  Arrival date and time: 10/04/19 4132   First Provider Initiated Contact with Patient 10/04/19 1006      Chief Complaint  Patient presents with  . Back Pain  . Nausea   Ms. Lisa Reese is a 29 y.o. year old G1P1001 female at [redacted]w[redacted]d weeks gestation who presents to MAU reporting positive pregnancy test at urgent care on Tuesday, October 01, 2019.  She reports nausea but no vomiting, back pain that is causing her to not be able to sleep.  She reports her last menstrual period was August 16, 2019.  She denies any abnormal vaginal discharge or bleeding.  She denies headache.  She reports mild pelvic pain on the left side.    OB History    Gravida  2   Para  1   Term  1   Preterm      AB      Living  1     SAB      TAB      Ectopic      Multiple  0   Live Births  1           Past Medical History:  Diagnosis Date  . Hyperemesis gravidarum   . Medical history non-contributory   . Vaginal Pap smear, abnormal     Past Surgical History:  Procedure Laterality Date  . NO PAST SURGERIES      Family History  Problem Relation Age of Onset  . Diabetes Father     Social History   Tobacco Use  . Smoking status: Never Smoker  . Smokeless tobacco: Never Used  Substance Use Topics  . Alcohol use: No  . Drug use: No    Allergies: No Known Allergies  No medications prior to admission.    Review of Systems  Constitutional: Negative.   HENT: Negative.   Eyes: Negative.   Respiratory: Negative.   Cardiovascular: Negative.   Gastrointestinal: Positive for nausea. Negative for vomiting.  Endocrine: Negative.   Genitourinary: Negative.   Musculoskeletal: Positive for back pain.  Skin: Negative.   Allergic/Immunologic: Negative.   Neurological: Negative.   Hematological: Negative.   Psychiatric/Behavioral: Negative.    Physical Exam   Blood pressure (!) 117/59, pulse 73, temperature 98.2 F (36.8 C), temperature source  Oral, resp. rate 18, height 5\' 5"  (1.651 m), last menstrual period 08/16/2019, SpO2 100 %.  Physical Exam  Nursing note and vitals reviewed. Constitutional: She is oriented to person, place, and time.  HENT:  Head: Normocephalic and atraumatic.  Nose: Nose normal.  Mouth/Throat: Mucous membranes are moist.  Eyes: Pupils are equal, round, and reactive to light.  Cardiovascular: Normal rate and normal pulses.  Respiratory: Effort normal.  GI: Soft. Normal appearance.  Genitourinary:    Vulva normal.     Genitourinary Comments: Uterus: non-tender, SE: cervix is smooth, pink, no lesions, small amt of thick, white vaginal d/c -- WP, GC/CT done, closed/long/firm, no CMT or friability, mild LT adnexal tenderness    Musculoskeletal:        General: Normal range of motion.     Cervical back: Normal range of motion.  Neurological: She is alert and oriented to person, place, and time.  Skin: Skin is warm and dry.  Psychiatric: Her behavior is normal. Mood, judgment and thought content normal.   MAU Course  Procedures  MDM CCUA Phenergan 25 mg IM -- no nausea/vomiting Pepcid 20 mg po PO Challenge -- patient  tolerated well   CCUA UPT CBC ABO/Rh -- not drawn, known O POS HCG Wet Prep GC/CT -- pending HIV -- pending OB < 14 wks [place] with TV  Results for orders placed or performed during the hospital encounter of 10/04/19 (from the past 24 hour(s))  Urinalysis, Routine w reflex microscopic     Status: Abnormal   Collection Time: 10/04/19  9:00 AM  Result Value Ref Range   Color, Urine AMBER (A) YELLOW   APPearance CLOUDY (A) CLEAR   Specific Gravity, Urine 1.027 1.005 - 1.030   pH 7.0 5.0 - 8.0   Glucose, UA NEGATIVE NEGATIVE mg/dL   Hgb urine dipstick NEGATIVE NEGATIVE   Bilirubin Urine NEGATIVE NEGATIVE   Ketones, ur NEGATIVE NEGATIVE mg/dL   Protein, ur 30 (A) NEGATIVE mg/dL   Nitrite NEGATIVE NEGATIVE   Leukocytes,Ua NEGATIVE NEGATIVE   RBC / HPF 0-5 0 - 5 RBC/hpf   WBC, UA  0-5 0 - 5 WBC/hpf   Bacteria, UA RARE (A) NONE SEEN   Squamous Epithelial / LPF 11-20 0 - 5   Mucus PRESENT   Wet prep, genital     Status: Abnormal   Collection Time: 10/04/19 10:05 AM   Specimen: PATH Cytology Cervicovaginal Ancillary Only  Result Value Ref Range   Yeast Wet Prep HPF POC NONE SEEN NONE SEEN   Trich, Wet Prep NONE SEEN NONE SEEN   Clue Cells Wet Prep HPF POC NONE SEEN NONE SEEN   WBC, Wet Prep HPF POC FEW (A) NONE SEEN   Sperm NONE SEEN   HIV Antibody (routine testing w rflx)     Status: None   Collection Time: 10/04/19 10:09 AM  Result Value Ref Range   HIV Screen 4th Generation wRfx Non Reactive Non Reactive  CBC     Status: Abnormal   Collection Time: 10/04/19 10:09 AM  Result Value Ref Range   WBC 5.0 4.0 - 10.5 K/uL   RBC 3.81 (L) 3.87 - 5.11 MIL/uL   Hemoglobin 11.4 (L) 12.0 - 15.0 g/dL   HCT 77.4 (L) 36 - 46 %   MCV 90.6 80.0 - 100.0 fL   MCH 29.9 26.0 - 34.0 pg   MCHC 33.0 30.0 - 36.0 g/dL   RDW 12.8 78.6 - 76.7 %   Platelets 314 150 - 400 K/uL   nRBC 0.0 0.0 - 0.2 %  ABO/Rh     Status: None   Collection Time: 10/04/19 10:09 AM  Result Value Ref Range   ABO/RH(D) O POS    No rh immune globuloin      NOT A RH IMMUNE GLOBULIN CANDIDATE, PT RH POSITIVE Performed at [facility], [address] [place], [place] [address]   hCG, quantitative, pregnancy     Status: Abnormal   Collection Time: 10/04/19 10:09 AM  Result Value Ref Range   hCG, Beta Chain, Quant, S 75,666 (H) <5 mIU/mL    US OB LESS THAN 14 WEEKS WITH OB TRANSVAGINAL  Result Date: 10/04/2019 CLINICAL DATA:  29 year old female with pain in the 1st trimester of pregnancy. Estimated gestational age by LMP 7 weeks 0 days. EXAM: OBSTETRIC <14 WK ULTRASOUND TECHNIQUE: Transabdominal ultrasound was performed for evaluation of the gestation as well as the maternal uterus and adnexal regions. COMPARISON:  Pelvis ultrasound 08/09/2018. FINDINGS: Intrauterine gestational sac: Single Yolk sac:   Visible Embryo:  Visible Cardiac Activity: Detected Heart Rate: 119 bpm CRL:   5.1 mm   6 w 1 d  [place] EDC: 05/28/2020 Subchorionic hemorrhage: A small volume of subchorionic hemorrhage is identified (image 19), and encompasses up to 2.8 cm (image 55). Maternal uterus/adnexae: The left ovary appears normal measuring 3.8 x 1.7 x 1.9 cm. The right ovary appears normal measuring 2.9 x 1.4 x 1.9 cm. Trace pelvic free fluid. IMPRESSION: Single living IUP demonstrated with a small to moderate volume of subchorionic hemorrhage (image 55). Electronically Signed   By: Odessa Fleming M.D.   On: 10/04/2019 10:58    Assessment and Plan  Abdominal pain affecting pregnancy  - Information provided on pelvic pain - Advised to take Tylenol 1000 mg every 6 hours prn pain   Intrauterine pregnancy  - Safe medications in pregnancy  Back pain affecting pregnancy in first trimester  - Advised to take Tylenol 1000 mg every 6 hours prn pain - Information provided on back pain in pregnancy   Subchorionic hematoma in first trimester, single or unspecified fetus - Information provided on [facility] - Advised at increased risk for miscarriage    - Discharge patient - Start [facility]  - Patient verbalized an understanding of the plan of care and agrees.     Raelyn Mora, MSN, CNM 10/04/2019, 8:15 PM

## 2019-10-05 LAB — CULTURE, OB URINE: Special Requests: NORMAL

## 2019-10-07 LAB — GC/CHLAMYDIA PROBE AMP (~~LOC~~) NOT AT ARMC
Chlamydia: NEGATIVE
Comment: NEGATIVE
Comment: NORMAL
Neisseria Gonorrhea: NEGATIVE

## 2019-10-09 ENCOUNTER — Other Ambulatory Visit: Payer: Self-pay

## 2019-10-09 ENCOUNTER — Inpatient Hospital Stay: Admission: RE | Admit: 2019-10-09 | Payer: 59 | Source: Ambulatory Visit

## 2019-10-09 ENCOUNTER — Encounter (HOSPITAL_COMMUNITY): Payer: Self-pay | Admitting: Obstetrics and Gynecology

## 2019-10-09 ENCOUNTER — Inpatient Hospital Stay (HOSPITAL_COMMUNITY)
Admission: AD | Admit: 2019-10-09 | Discharge: 2019-10-09 | Disposition: A | Discharge status: Home / Self Care | Payer: 59 | Attending: Obstetrics and Gynecology | Admitting: Obstetrics and Gynecology

## 2019-10-09 DIAGNOSIS — O26891 Other specified pregnancy related conditions, first trimester: Secondary | ICD-10-CM

## 2019-10-09 DIAGNOSIS — O211 Hyperemesis gravidarum with metabolic disturbance: Secondary | ICD-10-CM

## 2019-10-09 DIAGNOSIS — Z79899 Other long term (current) drug therapy: Secondary | ICD-10-CM | POA: Insufficient documentation

## 2019-10-09 DIAGNOSIS — O210 Mild hyperemesis gravidarum: Secondary | ICD-10-CM | POA: Insufficient documentation

## 2019-10-09 DIAGNOSIS — M549 Dorsalgia, unspecified: Secondary | ICD-10-CM | POA: Diagnosis not present

## 2019-10-09 DIAGNOSIS — O99891 Other specified diseases and conditions complicating pregnancy: Secondary | ICD-10-CM | POA: Diagnosis not present

## 2019-10-09 DIAGNOSIS — Z3A01 Less than 8 weeks gestation of pregnancy: Secondary | ICD-10-CM | POA: Diagnosis not present

## 2019-10-09 DIAGNOSIS — E860 Dehydration: Secondary | ICD-10-CM

## 2019-10-09 DIAGNOSIS — Z3490 Encounter for supervision of normal pregnancy, unspecified, unspecified trimester: Secondary | ICD-10-CM

## 2019-10-09 LAB — URINALYSIS, ROUTINE W REFLEX MICROSCOPIC
Bilirubin Urine: NEGATIVE
Glucose, UA: NEGATIVE mg/dL
Hgb urine dipstick: NEGATIVE
Ketones, ur: 20 mg/dL — AB
Leukocytes,Ua: NEGATIVE
Nitrite: NEGATIVE
Protein, ur: 30 mg/dL — AB
Specific Gravity, Urine: 1.029 (ref 1.005–1.030)
pH: 6 (ref 5.0–8.0)

## 2019-10-09 LAB — CBC
HCT: 38.6 % (ref 36.0–46.0)
Hemoglobin: 12.6 g/dL (ref 12.0–15.0)
MCH: 29.9 pg (ref 26.0–34.0)
MCHC: 32.6 g/dL (ref 30.0–36.0)
MCV: 91.5 fL (ref 80.0–100.0)
Platelets: 325 10*3/uL (ref 150–400)
RBC: 4.22 MIL/uL (ref 3.87–5.11)
RDW: 12.3 % (ref 11.5–15.5)
WBC: 6.7 10*3/uL (ref 4.0–10.5)
nRBC: 0 % (ref 0.0–0.2)

## 2019-10-09 LAB — COMPREHENSIVE METABOLIC PANEL
ALT: 16 U/L (ref 0–44)
AST: 20 U/L (ref 15–41)
Albumin: 4.1 g/dL (ref 3.5–5.0)
Alkaline Phosphatase: 37 U/L — ABNORMAL LOW (ref 38–126)
Anion gap: 10 (ref 5–15)
BUN: 7 mg/dL (ref 6–20)
CO2: 22 mmol/L (ref 22–32)
Calcium: 9.6 mg/dL (ref 8.9–10.3)
Chloride: 105 mmol/L (ref 98–111)
Creatinine, Ser: 0.65 mg/dL (ref 0.44–1.00)
GFR calc Af Amer: >60 mL/min (ref >60)
GFR calc non Af Amer: >60 mL/min (ref >60)
Glucose, Bld: 79 mg/dL (ref 70–99)
Potassium: 3.7 mmol/L (ref 3.5–5.1)
Sodium: 137 mmol/L (ref 135–145)
Total Bilirubin: 0.5 mg/dL (ref 0.3–1.2)
Total Protein: 8.7 g/dL — ABNORMAL HIGH (ref 6.5–8.1)

## 2019-10-09 MED ORDER — ONDANSETRON 4 MG PO TBDP
8.0000 mg | ORAL_TABLET | Freq: Once | ORAL | Status: AC
  Administered 2019-10-09: 8 mg via ORAL
  Filled 2019-10-09: qty 2

## 2019-10-09 MED ORDER — ONDANSETRON 8 MG PO TBDP: 8.0000 mg | ORAL_TABLET | Freq: Three times a day (TID) | ORAL | 0 refills | Status: DC | PRN

## 2019-10-09 MED ORDER — LACTATED RINGERS IV BOLUS
1000.0000 mL | Freq: Once | INTRAVENOUS | Status: AC
  Administered 2019-10-09: 1000 mL via INTRAVENOUS

## 2019-10-09 NOTE — Discharge Instructions (Signed)
[facility] [employer] for [employer] at [facility]  [address], [place], [place] [address]  [phone]  Center for [facility] at [facility]  [address], [place], [place] [address]  [phone]  Center for [facility] at [facility] [address], [place], [place] [address]  [phone]  Center for [facility] at [facility]  [address] [place], [place] [address]  [phone]  Center for [facility] at [facility]  [address]First floor), [place], [place] [address]  [phone]  Center for [facility] at [facility]  [address] [place], [place], [place] [address]  [phone]  [facility]  [address], [place], [place] [address]  [phone]  [facility]  [address] [place], [place], [place] [address]  [phone]  [facility]  [address] [place], [place] [address]  [phone]  [facility]  [address] [place], [place] [address]  [phone]  [facility]  [address], [place], [place] [address]  [phone]  [facility]   [address] [place], [place] [address]  [phone]  [facility]  [address], [place], [place] [address]   [phone]  [facility]  [address], [place], [place] [address]  [phone]          Morning Sickness  Morning sickness is when you feel sick to your stomach (nauseous) during pregnancy. You may feel sick to your stomach and throw up (vomit). You may feel sick in the morning, but you can feel this way at any time of day. Some women feel very sick to their stomach and cannot stop throwing up (hyperemesis gravidarum). Follow these instructions at home: Medicines  Take over-the-counter and prescription medicines only as told by your doctor. Do not take any medicines until you talk with your doctor  about them first.  Taking multivitamins before getting pregnant can stop or lessen the harshness of morning sickness. Eating and drinking  Eat dry toast or crackers before getting out of bed.  Eat 5 or 6 small meals a day.  Eat dry and bland foods like rice and baked potatoes.  Do not eat greasy, fatty, or spicy foods.  Have someone cook for you if the smell of food causes you to feel sick or throw up.  If you feel sick to your stomach after taking prenatal vitamins, take them at night or with a snack.  Eat protein when you need a snack. Nuts, yogurt, and cheese are good choices.  Drink fluids throughout the day.  Try ginger ale made with real ginger, ginger tea made from fresh grated ginger, or ginger candies. General instructions  Do not use any products that have nicotine or tobacco in them, such as cigarettes and e-cigarettes. If you need help quitting, ask your doctor.  Use an air purifier to keep the air in your house free of smells.  Get lots of fresh air.  Try to avoid smells that make you feel sick.  Try: ? Wearing a bracelet that is used for seasickness (acupressure wristband). ? Going to a doctor who puts thin needles into certain body points (acupuncture) to improve how you feel. Contact a doctor if:  You need medicine to feel better.  You feel dizzy or light-headed.  You are losing weight. Get help  right away if:  You feel very sick to your stomach and cannot stop throwing up.  You pass out (faint).  You have very bad pain in your belly. Summary  Morning sickness is when you feel sick to your stomach (nauseous) during pregnancy.  You may feel sick in the morning, but you can feel this way at any time of day.  Making some changes to what you eat may help your symptoms go away. This information is not intended to replace advice given to you by your health care provider. Make sure you discuss any questions you have with your health care  provider. Document Revised: 03/17/2017 Document Reviewed: 05/05/2016 Elsevier Patient Education  2020 [employer].

## 2019-10-09 NOTE — MAU Provider Note (Signed)
Patient Lisa Reese is a 29 y.o. G2P1001  at [redacted]w[redacted]d here with complaints of nausea and vomiting that started two days ago. She has tried to eat but cannot keep food or liquids down. She has been trying to eat fruit but it is not working. She denies vaginal bleeding, cramping, pain when she uses the bathroom. She reports that she was seen in MAU a few days ago and had nausea but no vomiting; at that time she was not worked up for vomiting.   She denies fever, SOB, dizziness, diarrhea, vaginal discharge.   History     CSN: [id number]  Arrival date and time: 10/09/19 4098   First Provider Initiated Contact with Patient 10/09/19 [phone]      Chief Complaint  Patient presents with   Emesis   Emesis  This is a new problem. The current episode started in the past 7 days. The problem occurs 5 to 10 times per day. The problem has been unchanged. The emesis has an appearance of stomach contents and bile. There has been no fever. Pertinent negatives include no abdominal pain, chest pain, chills, coughing, diarrhea, dizziness or fever.  She has tried to eat fruit but can't keep anything down.   OB History    Gravida  2   Para  1   Term  1   Preterm      AB      Living  1     SAB      TAB      Ectopic      Multiple  0   Live Births  1           Past Medical History:  Diagnosis Date   Hyperemesis gravidarum    Medical history non-contributory    Vaginal Pap smear, abnormal     Past Surgical History:  Procedure Laterality Date   NO PAST SURGERIES      Family History  Problem Relation Age of Onset   Diabetes Father     Social History   Tobacco Use   Smoking status: Never Smoker   Smokeless tobacco: Never Used  Substance Use Topics   Alcohol use: No   Drug use: No    Allergies: No Known Allergies  Medications Prior to Admission  Medication Sig Dispense Refill Last Dose   Multiple Vitamins-Minerals (MULTIVITAMIN WITH MINERALS) tablet Take 1  tablet by mouth daily.      ondansetron (ZOFRAN) 4 MG tablet Take 1 tablet (4 mg total) by mouth every 8 (eight) hours as needed for nausea or vomiting. 12 tablet 0    pantoprazole (PROTONIX) 20 MG tablet Take 1 tablet (20 mg total) by mouth daily. 30 tablet 0    sucralfate (CARAFATE) 1 GM/10ML suspension Take 10 mLs (1 g total) by mouth 4 (four) times daily -  with meals and at bedtime. 420 mL 0     Review of Systems  Constitutional: Negative for chills and fever.  Respiratory: Negative for cough.   Cardiovascular: Negative for chest pain.  Gastrointestinal: Positive for vomiting. Negative for abdominal pain and diarrhea.  Genitourinary: Negative.   Neurological: Negative for dizziness.  Psychiatric/Behavioral: Negative.    Physical Exam   Blood pressure (!) 122/57, pulse 65, temperature 98.5 F (36.9 C), temperature source Oral, resp. rate 17, weight 70.8 kg, last menstrual period 08/16/2019, SpO2 100 %.  Physical Exam  HENT:  Head: Normocephalic.  Cardiovascular: Normal rate.  Respiratory: Effort normal.  GI: Normal appearance.  Musculoskeletal:  General: Normal range of motion.  Neurological: She is alert.    MAU Course  Procedures  MDM IV LR  CBC, CMP> no electrolyte imbalance.  UA shows mild dehydration; IV done and patient given Zofran, which eliminated her NV. She tolerated crackers, juice and peanut butter while in MAU.  Assessment and Plan   1. Morning sickness   2. Intrauterine pregnancy   3. Back pain affecting pregnancy in first trimester    2. Patient stable for discharge with recommendation to start prenatal care; patient given list of provider. Reviewed first trimester bleeding precautions. All questions answered.   3. Eat small meals; return to MAU if cannot keep down liquids; or if any other concerning signs or symptoms.   Mervyn Skeeters [name] 10/09/2019, 10:05 AM

## 2019-10-09 NOTE — MAU Note (Signed)
Unable to keep anything down.  Throat is dry.  When here last time, a med was to have been ordered at pharmacy, when got there- there was no prescription.

## 2019-10-09 NOTE — MAU Note (Signed)
Patient states she was seen last week (maybe the 15th per patient) for first OB appointment and complaints of nausea and vomiting. Provider sent in prescription for zofran but the order never came thru the pharmacy. Patient states the medication was not at pharmacy when she went to pick it up and she never called the office back because she could not find the number.

## 2019-10-09 NOTE — MAU Note (Signed)
Pt in bathroom when first called from lobby, so unable to void

## 2019-10-21 ENCOUNTER — Other Ambulatory Visit: Payer: Self-pay

## 2019-10-21 ENCOUNTER — Inpatient Hospital Stay (HOSPITAL_COMMUNITY): Payer: 59

## 2019-10-21 ENCOUNTER — Inpatient Hospital Stay (HOSPITAL_COMMUNITY)
Admission: AD | Admit: 2019-10-21 | Discharge: 2019-10-21 | Disposition: A | Discharge status: Home / Self Care | Payer: 59 | Attending: Obstetrics & Gynecology | Admitting: Obstetrics & Gynecology

## 2019-10-21 ENCOUNTER — Encounter (HOSPITAL_COMMUNITY): Payer: Self-pay | Admitting: Obstetrics & Gynecology

## 2019-10-21 DIAGNOSIS — M549 Dorsalgia, unspecified: Secondary | ICD-10-CM

## 2019-10-21 DIAGNOSIS — Z3A09 9 weeks gestation of pregnancy: Secondary | ICD-10-CM | POA: Insufficient documentation

## 2019-10-21 DIAGNOSIS — O219 Vomiting of pregnancy, unspecified: Secondary | ICD-10-CM

## 2019-10-21 DIAGNOSIS — O26899 Other specified pregnancy related conditions, unspecified trimester: Secondary | ICD-10-CM | POA: Diagnosis present

## 2019-10-21 DIAGNOSIS — O26891 Other specified pregnancy related conditions, first trimester: Secondary | ICD-10-CM | POA: Diagnosis not present

## 2019-10-21 DIAGNOSIS — K5900 Constipation, unspecified: Secondary | ICD-10-CM | POA: Diagnosis not present

## 2019-10-21 DIAGNOSIS — O99611 Diseases of the digestive system complicating pregnancy, first trimester: Secondary | ICD-10-CM | POA: Diagnosis not present

## 2019-10-21 DIAGNOSIS — Z3490 Encounter for supervision of normal pregnancy, unspecified, unspecified trimester: Secondary | ICD-10-CM

## 2019-10-21 DIAGNOSIS — R102 Pelvic and perineal pain: Secondary | ICD-10-CM | POA: Insufficient documentation

## 2019-10-21 DIAGNOSIS — O210 Mild hyperemesis gravidarum: Secondary | ICD-10-CM | POA: Diagnosis present

## 2019-10-21 DIAGNOSIS — Z79899 Other long term (current) drug therapy: Secondary | ICD-10-CM | POA: Insufficient documentation

## 2019-10-21 LAB — URINALYSIS, ROUTINE W REFLEX MICROSCOPIC
Bilirubin Urine: NEGATIVE
Glucose, UA: NEGATIVE mg/dL
Hgb urine dipstick: NEGATIVE
Ketones, ur: 20 mg/dL — AB
Leukocytes,Ua: NEGATIVE
Nitrite: NEGATIVE
Protein, ur: 100 mg/dL — AB
Specific Gravity, Urine: 1.034 — ABNORMAL HIGH (ref 1.005–1.030)
pH: 6 (ref 5.0–8.0)

## 2019-10-21 MED ORDER — PROMETHAZINE HCL 12.5 MG PO TABS: 12.5000 mg | ORAL_TABLET | Freq: Four times a day (QID) | ORAL | 0 refills | Status: DC | PRN

## 2019-10-21 MED ORDER — SODIUM CHLORIDE 0.9 % IV SOLN
25.0000 mg | Freq: Once | INTRAVENOUS | Status: AC
  Administered 2019-10-21: 25 mg via INTRAVENOUS
  Filled 2019-10-21: qty 1

## 2019-10-21 NOTE — MAU Provider Note (Signed)
History     CSN: [id number]  Arrival date and time: 10/21/19 0841   First Provider Initiated Contact with Patient 10/21/19 [phone]      Chief Complaint  Patient presents with  . Abdominal Pain   Lisa Reese is a 29 y.o. year old G24P1001 female at [redacted]w[redacted]d weeks gestation who presents to MAU reporting sharp lower abdominal pain, nausea vomiting-nausea medicine is not helping.  She denies dysuria diarrhea.  She reports occasional constipation when taking nausea medicine "too much."  She previously had an ultrasound on October 04, 2019 that showed an intrauterine pregnancy of 6 weeks 1 day and a subchorionic hemorrhage that was small to moderate in size.   OB History    Gravida  2   Para  1   Term  1   Preterm      AB      Living  1     SAB      TAB      Ectopic      Multiple  0   Live Births  1           Past Medical History:  Diagnosis Date  . Hyperemesis gravidarum   . Medical history non-contributory   . Vaginal Pap smear, abnormal     Past Surgical History:  Procedure Laterality Date  . NO PAST SURGERIES      Family History  Problem Relation Age of Onset  . Diabetes Father     Social History   Tobacco Use  . Smoking status: Never Smoker  . Smokeless tobacco: Never Used  Substance Use Topics  . Alcohol use: No  . Drug use: No    Allergies: No Known Allergies  Medications Prior to Admission  Medication Sig Dispense Refill Last Dose  . Multiple Vitamins-Minerals (MULTIVITAMIN WITH MINERALS) tablet Take 1 tablet by mouth daily.   10/20/2019 at Unknown time  . ondansetron (ZOFRAN ODT) 8 MG disintegrating tablet Take 1 tablet (8 mg total) by mouth every 8 (eight) hours as needed for nausea or vomiting. 30 tablet 0 10/20/2019 at Unknown time  . pantoprazole (PROTONIX) 20 MG tablet Take 1 tablet (20 mg total) by mouth daily. 30 tablet 0   . sucralfate (CARAFATE) 1 GM/10ML suspension Take 10 mLs (1 g total) by mouth 4 (four) times daily -  with  meals and at bedtime. 420 mL 0     Review of Systems  Constitutional: Negative.   HENT: Negative.   Eyes: Negative.   Respiratory: Negative.   Cardiovascular: Negative.   Gastrointestinal: Positive for abdominal pain (upper), nausea and vomiting.  Endocrine: Negative.   Genitourinary: Positive for pelvic pain. Negative for vaginal bleeding.  Musculoskeletal: Negative.   Skin: Negative.   Allergic/Immunologic: Negative.   Neurological: Negative.   Hematological: Negative.   Psychiatric/Behavioral: Negative.    Physical Exam   Blood pressure 112/61, pulse 71, temperature 98.5 F (36.9 C), temperature source Oral, resp. rate 16, height 5\' 5"  (1.651 m), weight 70.3 kg, last menstrual period 08/16/2019, SpO2 100 %.  Physical Exam Vitals and nursing note reviewed. Exam conducted with a chaperone present.  Constitutional:      Appearance: She is well-developed and normal weight.  HENT:     Head: Normocephalic and atraumatic.  Pulmonary:     Effort: Pulmonary effort is normal.  Abdominal:     Tenderness: There is abdominal tenderness in the right upper quadrant and left lower quadrant.  Genitourinary:    Vagina: Normal.  Cervix: Normal.     Uterus: Normal. Enlarged.      Adnexa:        Right: Tenderness present.        Left: Tenderness present.      Comments: Cervix: closed/long/firm/posterior Skin:    General: Skin is warm and dry.  Neurological:     Mental Status: She is alert and oriented to person, place, and time.  Psychiatric:        Mood and Affect: Mood normal.        Behavior: Behavior normal.     MAU Course  Procedures  MDM CCUA IVF: Phenergan 25 mg in LR 1000 mL @ 999 mL/hr Abdomen Complete U/S OB U/S <14 wks  Results for orders placed or performed during the hospital encounter of 10/21/19 (from the past 72 hour(s))  Urinalysis, Routine w reflex microscopic     Status: Abnormal   Collection Time: 10/21/19  8:56 AM  Result Value Ref Range   Color,  Urine AMBER (A) YELLOW    Comment: BIOCHEMICALS MAY BE AFFECTED BY COLOR   APPearance HAZY (A) CLEAR   Specific Gravity, Urine 1.034 (H) 1.005 - 1.030   pH 6.0 5.0 - 8.0   Glucose, UA NEGATIVE NEGATIVE mg/dL   Hgb urine dipstick NEGATIVE NEGATIVE   Bilirubin Urine NEGATIVE NEGATIVE   Ketones, ur 20 (A) NEGATIVE mg/dL   Protein, ur 759 (A) NEGATIVE mg/dL   Nitrite NEGATIVE NEGATIVE   Leukocytes,Ua NEGATIVE NEGATIVE   RBC / HPF 0-5 0 - 5 RBC/hpf   WBC, UA 0-5 0 - 5 WBC/hpf   Bacteria, UA FEW (A) NONE SEEN   Squamous Epithelial / LPF 21-50 0 - 5   Mucus PRESENT     Comment: Performed at [facility], [address], [place], [place] [address]    Assessment and Plan  Pelvic pain affecting pregnancy  - Advised to take Tylenol 1000 mg po every 6 hours prn pain - Information provided on pelvic pain preg   Morning sickness  - Rx for Phenergan 12.5 mg po every 6 hours prn N/V  - Discharge patient - Patient verbalized an understanding of the plan of care and agrees.   Raelyn Mora, MSN, CNM 10/21/2019, 9:27 AM

## 2019-10-21 NOTE — Discharge Instructions (Signed)

## 2019-10-21 NOTE — MAU Note (Signed)
Having sharp pains in lower abd.  Had before, but has gotten worse. Has taken medication for n/v, "but after a while, is still throwing up". Denies dysuria, diarrhea or constipation- though reports constipation at times if taking the nausea medicine too much.

## 2019-10-24 ENCOUNTER — Encounter: Payer: Self-pay | Admitting: Family Medicine

## 2019-11-01 ENCOUNTER — Inpatient Hospital Stay (HOSPITAL_COMMUNITY)
Admission: AD | Admit: 2019-11-01 | Discharge: 2019-11-02 | Discharge status: Against Medical Advice | Payer: 59 | Attending: Obstetrics & Gynecology | Admitting: Obstetrics & Gynecology

## 2019-11-01 ENCOUNTER — Other Ambulatory Visit: Payer: Self-pay

## 2019-11-01 ENCOUNTER — Encounter (HOSPITAL_COMMUNITY): Payer: Self-pay | Admitting: Obstetrics & Gynecology

## 2019-11-01 DIAGNOSIS — O219 Vomiting of pregnancy, unspecified: Secondary | ICD-10-CM | POA: Diagnosis not present

## 2019-11-01 DIAGNOSIS — R109 Unspecified abdominal pain: Secondary | ICD-10-CM | POA: Insufficient documentation

## 2019-11-01 DIAGNOSIS — Z3A11 11 weeks gestation of pregnancy: Secondary | ICD-10-CM | POA: Diagnosis not present

## 2019-11-01 DIAGNOSIS — O26891 Other specified pregnancy related conditions, first trimester: Secondary | ICD-10-CM | POA: Insufficient documentation

## 2019-11-01 DIAGNOSIS — O99891 Other specified diseases and conditions complicating pregnancy: Secondary | ICD-10-CM

## 2019-11-01 DIAGNOSIS — Z79899 Other long term (current) drug therapy: Secondary | ICD-10-CM | POA: Insufficient documentation

## 2019-11-01 DIAGNOSIS — Z3490 Encounter for supervision of normal pregnancy, unspecified, unspecified trimester: Secondary | ICD-10-CM

## 2019-11-01 LAB — CBC WITH DIFFERENTIAL/PLATELET
Abs Immature Granulocytes: 0.05 10*3/uL (ref 0.00–0.07)
Basophils Absolute: 0 10*3/uL (ref 0.0–0.1)
Basophils Relative: 0 %
Eosinophils Absolute: 0 10*3/uL (ref 0.0–0.5)
Eosinophils Relative: 0 %
HCT: 34.4 % — ABNORMAL LOW (ref 36.0–46.0)
Hemoglobin: 11.6 g/dL — ABNORMAL LOW (ref 12.0–15.0)
Immature Granulocytes: 1 %
Lymphocytes Relative: 13 %
Lymphs Abs: 1.2 10*3/uL (ref 0.7–4.0)
MCH: 30.1 pg (ref 26.0–34.0)
MCHC: 33.7 g/dL (ref 30.0–36.0)
MCV: 89.1 fL (ref 80.0–100.0)
Monocytes Absolute: 0.6 10*3/uL (ref 0.1–1.0)
Monocytes Relative: 7 %
Neutro Abs: 7.6 10*3/uL (ref 1.7–7.7)
Neutrophils Relative %: 79 %
Platelets: 340 10*3/uL (ref 150–400)
RBC: 3.86 MIL/uL — ABNORMAL LOW (ref 3.87–5.11)
RDW: 12.5 % (ref 11.5–15.5)
WBC: 9.5 10*3/uL (ref 4.0–10.5)
nRBC: 0 % (ref 0.0–0.2)

## 2019-11-01 LAB — URINALYSIS, ROUTINE W REFLEX MICROSCOPIC
Glucose, UA: NEGATIVE mg/dL
Hgb urine dipstick: NEGATIVE
Ketones, ur: 80 mg/dL — AB
Leukocytes,Ua: NEGATIVE
Nitrite: NEGATIVE
Protein, ur: 100 mg/dL — AB
Specific Gravity, Urine: 1.036 — ABNORMAL HIGH (ref 1.005–1.030)
pH: 6 (ref 5.0–8.0)

## 2019-11-01 MED ORDER — METOCLOPRAMIDE HCL 5 MG/ML IJ SOLN
10.0000 mg | Freq: Once | INTRAMUSCULAR | Status: AC
  Administered 2019-11-01: 10 mg via INTRAVENOUS
  Filled 2019-11-01: qty 2

## 2019-11-01 MED ORDER — M.V.I. ADULT IV INJ
Freq: Once | INTRAVENOUS | Status: AC
  Filled 2019-11-01: qty 10

## 2019-11-01 MED ORDER — PANTOPRAZOLE SODIUM 40 MG IV SOLR
40.0000 mg | Freq: Once | INTRAVENOUS | Status: AC
  Administered 2019-11-01: 40 mg via INTRAVENOUS
  Filled 2019-11-01: qty 40

## 2019-11-01 MED ORDER — LACTATED RINGERS IV BOLUS
1000.0000 mL | Freq: Once | INTRAVENOUS | Status: AC
  Administered 2019-11-01: 1000 mL via INTRAVENOUS

## 2019-11-01 NOTE — MAU Note (Signed)
PT SAYS FEELS CRAMPS WHEN VOMITING .  TOOK PHENERGAN AT 9 .  STOPPED ZOFRAN - MAKES CONSTIPATED. Marland Kitchen  VOMITED WHEN CAME TO HOSPITAL.

## 2019-11-01 NOTE — MAU Provider Note (Signed)
History     CSN: [id number]  Arrival date and time: 11/01/19 2120   First Provider Initiated Contact with Patient 11/01/19 2255      Chief Complaint  Patient presents with  . Emesis   Lisa Reese is a 29 y.o. G2P1001 at [redacted]w[redacted]d who receives care at [facility].  She presents today for Emesis.  She reports she has been throwing up all day and is now having stomach pain.  Patient states that she took her phenergan at 9pm with no resolution of symptoms.  Patient states she has not been eating and has been drinking tea or water, but is unable to keep it down.  Patient states her abdominal pain started this evening around 6 or 7 pm.  Patient states the pain is worse when she has the urge to throw up.  She is unable to describe the abdominal pain and states it is worse before and during vomiting, but resolves afterwards.    OB History    Gravida  2   Para  1   Term  1   Preterm      AB      Living  1     SAB      TAB      Ectopic      Multiple  0   Live Births  1           Past Medical History:  Diagnosis Date  . Hyperemesis gravidarum   . Medical history non-contributory   . Vaginal Pap smear, abnormal     Past Surgical History:  Procedure Laterality Date  . NO PAST SURGERIES      Family History  Problem Relation Age of Onset  . Diabetes Father     Social History   Tobacco Use  . Smoking status: Never Smoker  . Smokeless tobacco: Never Used  Substance Use Topics  . Alcohol use: No  . Drug use: No    Allergies: No Known Allergies  Medications Prior to Admission  Medication Sig Dispense Refill Last Dose  . ondansetron (ZOFRAN ODT) 8 MG disintegrating tablet Take 1 tablet (8 mg total) by mouth every 8 (eight) hours as needed for nausea or vomiting. 30 tablet 0 Past Month at Unknown time  . Prenatal Vit-Fe Fumarate-FA (MULTIVITAMIN-PRENATAL) 27-0.8 MG TABS tablet Take 1 tablet by mouth daily.   10/31/2019 at Unknown time  . promethazine  (PHENERGAN) 12.5 MG tablet Take 1 tablet (12.5 mg total) by mouth every 6 (six) hours as needed for nausea or vomiting. 30 tablet 0 11/01/2019 at 2000  . Ascorbic Acid (VITAMIN C PO) Take 1 tablet by mouth daily.       Review of Systems  Constitutional: Negative for chills and fever.  Respiratory: Positive for cough. Negative for shortness of breath.   Gastrointestinal: Positive for abdominal pain (During vomiting in BLQ), nausea and vomiting. Negative for constipation and diarrhea.  Genitourinary: Negative for difficulty urinating, dysuria, pelvic pain, vaginal bleeding and vaginal discharge.  Musculoskeletal: Negative for back pain.  Neurological: Positive for headaches. Negative for dizziness and light-headedness.   Physical Exam   Blood pressure 117/62, pulse 95, temperature 98.7 F (37.1 C), temperature source Oral, resp. rate 20, height 5\' 5"  (1.651 m), weight 67.9 kg, last menstrual period 08/16/2019.  Physical Exam Constitutional:      Appearance: Normal appearance.  HENT:     Head: Normocephalic and atraumatic.  Eyes:     Conjunctiva/sclera: Conjunctivae normal.  Cardiovascular:  Rate and Rhythm: Normal rate and regular rhythm.  Pulmonary:     Effort: Pulmonary effort is normal. No respiratory distress.     Breath sounds: Normal breath sounds.  Abdominal:     General: Bowel sounds are normal.     Palpations: Abdomen is soft.     Tenderness: There is abdominal tenderness in the right upper quadrant, epigastric area and left upper quadrant.  Musculoskeletal:        General: Normal range of motion.     Cervical back: Normal range of motion.  Skin:    General: Skin is warm and dry.  Neurological:     Mental Status: She is alert and oriented to person, place, and time.  Psychiatric:        Mood and Affect: Mood normal.        Thought Content: Thought content normal.     MAU Course  Procedures Results for orders placed or performed during the hospital encounter of  11/01/19 (from the past 24 hour(s))  Urinalysis, Routine w reflex microscopic     Status: Abnormal   Collection Time: 11/01/19 10:07 PM  Result Value Ref Range   Color, Urine AMBER (A) YELLOW   APPearance HAZY (A) CLEAR   Specific Gravity, Urine 1.036 (H) 1.005 - 1.030   pH 6.0 5.0 - 8.0   Glucose, UA NEGATIVE NEGATIVE mg/dL   Hgb urine dipstick NEGATIVE NEGATIVE   Bilirubin Urine SMALL (A) NEGATIVE   Ketones, ur 80 (A) NEGATIVE mg/dL   Protein, ur 035 (A) NEGATIVE mg/dL   Nitrite NEGATIVE NEGATIVE   Leukocytes,Ua NEGATIVE NEGATIVE   RBC / HPF 0-5 0 - 5 RBC/hpf   WBC, UA 0-5 0 - 5 WBC/hpf   Bacteria, UA FEW (A) NONE SEEN   Squamous Epithelial / LPF 21-50 0 - 5   Mucus PRESENT   CBC with Differential/Platelet     Status: Abnormal   Collection Time: 11/01/19 11:21 PM  Result Value Ref Range   WBC 9.5 4.0 - 10.5 K/uL   RBC 3.86 (L) 3.87 - 5.11 MIL/uL   Hemoglobin 11.6 (L) 12.0 - 15.0 g/dL   HCT 46.5 (L) 36 - 46 %   MCV 89.1 80.0 - 100.0 fL   MCH 30.1 26.0 - 34.0 pg   MCHC 33.7 30.0 - 36.0 g/dL   RDW 68.1 27.5 - 17.0 %   Platelets 340 150 - 400 K/uL   nRBC 0.0 0.0 - 0.2 %   Neutrophils Relative % 79 %   Neutro Abs 7.6 1.7 - 7.7 K/uL   Lymphocytes Relative 13 %   Lymphs Abs 1.2 0.7 - 4.0 K/uL   Monocytes Relative 7 %   Monocytes Absolute 0.6 0 - 1 K/uL   Eosinophils Relative 0 %   Eosinophils Absolute 0.0 0 - 0 K/uL   Basophils Relative 0 %   Basophils Absolute 0.0 0 - 0 K/uL   Immature Granulocytes 1 %   Abs Immature Granulocytes 0.05 0.00 - 0.07 K/uL  Comprehensive metabolic panel     Status: Abnormal   Collection Time: 11/01/19 11:21 PM  Result Value Ref Range   Sodium 136 135 - 145 mmol/L   Potassium 3.3 (L) 3.5 - 5.1 mmol/L   Chloride 105 98 - 111 mmol/L   CO2 20 (L) 22 - 32 mmol/L   Glucose, Bld 92 70 - 99 mg/dL   BUN 7 6 - 20 mg/dL   Creatinine, Ser 0.17 0.44 - 1.00 mg/dL   Calcium  9.8 8.9 - 10.3 mg/dL   Total Protein 8.3 (H) 6.5 - 8.1 g/dL   Albumin 3.7 3.5  - 5.0 g/dL   AST 19 15 - 41 U/L   ALT 22 0 - 44 U/L   Alkaline Phosphatase 41 38 - 126 U/L   Total Bilirubin 0.7 0.3 - 1.2 mg/dL   GFR calc non Af Amer >60 >60 mL/min   GFR calc Af Amer >60 >60 mL/min   Anion gap 11 5 - 15  Amylase     Status: Abnormal   Collection Time: 11/01/19 11:21 PM  Result Value Ref Range   Amylase 102 (H) 28 - 100 U/L  Lipase, blood     Status: None   Collection Time: 11/01/19 11:21 PM  Result Value Ref Range   Lipase 23 11 - 51 U/L    MDM Start IV LR Bolus f/b MVI Antiemetic PPI Labs: CBC, CMP, Amylase, Lipase, UC Assessment and Plan  29 year old G2P1001 SIUP at 11 weeks Nausea/Vomiting Abdominal Pain  -POC Reviewed. -Start IV and give LR Bolus -Will give Reglan and Protonix via IV -Labs ordered -Will check amylase and lipase as patient with c/o upper abdominal pain. -UC ordered -Will give MVI after completion of LR bolus and will consider steroid dosage. -MAU observation.   Cherre Robins 11/01/2019, 10:55 PM   Reassessment (12:49 AM) -Nurse reports patient left AMA  Cherre Robins MSN, CNM Advanced Practice Provider, [facility] [employer]

## 2019-11-02 LAB — COMPREHENSIVE METABOLIC PANEL
ALT: 22 U/L (ref 0–44)
AST: 19 U/L (ref 15–41)
Albumin: 3.7 g/dL (ref 3.5–5.0)
Alkaline Phosphatase: 41 U/L (ref 38–126)
Anion gap: 11 (ref 5–15)
BUN: 7 mg/dL (ref 6–20)
CO2: 20 mmol/L — ABNORMAL LOW (ref 22–32)
Calcium: 9.8 mg/dL (ref 8.9–10.3)
Chloride: 105 mmol/L (ref 98–111)
Creatinine, Ser: 0.56 mg/dL (ref 0.44–1.00)
GFR calc Af Amer: >60 mL/min (ref >60)
GFR calc non Af Amer: >60 mL/min (ref >60)
Glucose, Bld: 92 mg/dL (ref 70–99)
Potassium: 3.3 mmol/L — ABNORMAL LOW (ref 3.5–5.1)
Sodium: 136 mmol/L (ref 135–145)
Total Bilirubin: 0.7 mg/dL (ref 0.3–1.2)
Total Protein: 8.3 g/dL — ABNORMAL HIGH (ref 6.5–8.1)

## 2019-11-02 LAB — LIPASE, BLOOD: Lipase: 23 U/L (ref 11–51)

## 2019-11-02 LAB — AMYLASE: Amylase: 102 U/L — ABNORMAL HIGH (ref 28–100)

## 2019-11-03 LAB — CULTURE, OB URINE

## 2019-11-04 ENCOUNTER — Telehealth: Payer: Self-pay | Admitting: General Practice

## 2019-11-04 ENCOUNTER — Other Ambulatory Visit: Payer: Self-pay

## 2019-11-04 DIAGNOSIS — O210 Mild hyperemesis gravidarum: Secondary | ICD-10-CM

## 2019-11-06 MED ORDER — PROMETHAZINE HCL 12.5 MG PO TABS: 12.5000 mg | ORAL_TABLET | Freq: Four times a day (QID) | ORAL | 0 refills | Status: DC | PRN

## 2019-11-25 ENCOUNTER — Encounter (HOSPITAL_COMMUNITY): Payer: Self-pay | Admitting: Obstetrics and Gynecology

## 2019-11-25 ENCOUNTER — Other Ambulatory Visit: Payer: Self-pay

## 2019-11-25 ENCOUNTER — Inpatient Hospital Stay (HOSPITAL_COMMUNITY)
Admission: AD | Admit: 2019-11-25 | Discharge: 2019-11-25 | Disposition: A | Discharge status: Home / Self Care | Payer: 59 | Attending: Obstetrics and Gynecology | Admitting: Obstetrics and Gynecology

## 2019-11-25 DIAGNOSIS — Z3A14 14 weeks gestation of pregnancy: Secondary | ICD-10-CM | POA: Diagnosis not present

## 2019-11-25 DIAGNOSIS — Z79899 Other long term (current) drug therapy: Secondary | ICD-10-CM | POA: Insufficient documentation

## 2019-11-25 DIAGNOSIS — O99891 Other specified diseases and conditions complicating pregnancy: Secondary | ICD-10-CM | POA: Diagnosis not present

## 2019-11-25 DIAGNOSIS — O23592 Infection of other part of genital tract in pregnancy, second trimester: Secondary | ICD-10-CM | POA: Diagnosis not present

## 2019-11-25 DIAGNOSIS — N764 Abscess of vulva: Secondary | ICD-10-CM | POA: Diagnosis not present

## 2019-11-25 LAB — URINALYSIS, ROUTINE W REFLEX MICROSCOPIC
Bilirubin Urine: NEGATIVE
Glucose, UA: >=500 mg/dL — AB
Hgb urine dipstick: NEGATIVE
Ketones, ur: NEGATIVE mg/dL
Leukocytes,Ua: NEGATIVE
Nitrite: NEGATIVE
Protein, ur: NEGATIVE mg/dL
Specific Gravity, Urine: 1.027 (ref 1.005–1.030)
pH: 6 (ref 5.0–8.0)

## 2019-11-25 MED ORDER — ACETAMINOPHEN 325 MG PO TABS
650.0000 mg | ORAL_TABLET | Freq: Once | ORAL | Status: AC
  Administered 2019-11-25: 650 mg via ORAL
  Filled 2019-11-25: qty 2

## 2019-11-25 MED ORDER — LIDOCAINE HCL (PF) 1 % IJ SOLN
5.0000 mL | Freq: Once | INTRAMUSCULAR | Status: AC
  Administered 2019-11-25: 5 mL via INTRADERMAL
  Filled 2019-11-25: qty 5

## 2019-11-25 NOTE — Discharge Instructions (Signed)
You were seen for concern of a vulvar abscess today. An incision and drainage of the abscess was performed. Given minimal drainage, antibiotics were not prescribed. We recommend sitz baths 3-4 times daily. You may take tylenol 650mg  every 6 hours as needed for pain. You may also place wet maxi-pads in the freezer to provide relief for vulvar pain.  Please call your physician if concern of surrounding redness, worsening pain or fever (temperature greater than 100.56F).  How to Take a A sitz bath is a warm water bath that may be used to care for your rectum, genital area, or the area between your rectum and genitals (perineum). For a sitz bath, the water only comes up to your hips and covers your buttocks. A sitz bath may done at home in a bathtub or with a portable sitz bath that fits over the toilet. Your health care provider may recommend a sitz bath to help:  Relieve pain and discomfort after delivering a baby.  Relieve pain and itching from hemorrhoids or anal fissures.  Relieve pain after certain surgeries.  Relax muscles that are sore or tight. How to take a sitz bath Take 3-4 sitz baths a day, or as many as told by your health care provider. Bathtub sitz bath To take a sitz bath in a bathtub: 1. Partially fill a bathtub with warm water. The water should be deep enough to cover your hips and buttocks when you are sitting in the tub. 2. If your health care provider told you to put medicine in the water, follow his or her instructions. 3. Sit in the water. 4. Open the tub drain a little, and leave it open during your bath. 5. Turn on the warm water again, enough to replace the water that is draining out. Keep the water running throughout your bath. This helps keep the water at the right level and the right temperature. 6. Soak in the water for 15-20 minutes, or as long as told by your health care provider. 7. When you are done, be careful when you stand up. You may feel  dizzy. 8. After the sitz bath, pat yourself dry. Do not rub your skin to dry it.  Over-the-toilet sitz bath To take a sitz bath with an over-the-toilet basin: 1. Follow the manufacturer's instructions. 2. Fill the basin with warm water. 3. If your health care provider told you to put medicine in the water, follow his or her instructions. 4. Sit on the seat. Make sure the water covers your buttocks and perineum. 5. Soak in the water for 15-20 minutes, or as long as told by your health care provider. 6. After the sitz bath, pat yourself dry. Do not rub your skin to dry it. 7. Clean and dry the basin between uses. 8. Discard the basin if it cracks, or according to the manufacturer's instructions. Contact a health care provider if:  Your symptoms get worse. Do not continue with sitz baths if your symptoms get worse.  You have new symptoms. If this happens, do not continue with sitz baths until you talk with your health care provider. Summary  A sitz bath is a warm water bath in which the water only comes up to your hips and covers your buttocks.  A sitz bath may help relieve itching, relieve pain, and relax muscles that are sore or tight in the lower part of your body, including your genital area.  Take 3-4 sitz baths a day, or as many as told  by your health care provider. Soak in the water for 15-20 minutes.  Do not continue with sitz baths if your symptoms get worse. This information is not intended to replace advice given to you by your health care provider. Make sure you discuss any questions you have with your health care provider. Document Revised: 09/03/2018 Document Reviewed: 04/06/2017 Elsevier Patient Education  2020 [employer].

## 2019-11-25 NOTE — MAU Note (Signed)
Pt reports seeing a red circle in her vagina area and it is painful.   Pt reports it has been there 3 days.

## 2019-11-25 NOTE — MAU Provider Note (Signed)
Chief Complaint: Vaginal Pain   First Provider Initiated Contact with Patient 11/25/19 1539     SUBJECTIVE HPI: Lisa Reese is a 29 y.o. G2P1001 at [redacted]w[redacted]d by LMP who presents to maternity admissions reporting 3 days of vaginal pain, redness and swelling. Pt reports a "large bump" that has not improved with shea butter cream. Pt reports a similar lesion prior, but this time "the bump does not drain". No medications prior to presentation. Pt not taking a prenatal vitamin given concern of "upset stomach". Pt has her next prenatal appointment scheduled for 12/05/19.  She denies vaginal bleeding, urinary symptoms, h/a, dizziness, n/v, or fever/chills.  HPI  Past Medical History:  Diagnosis Date  . Hyperemesis gravidarum   . Medical history non-contributory   . Vaginal Pap smear, abnormal    Past Surgical History:  Procedure Laterality Date  . NO PAST SURGERIES     Social History   Socioeconomic History  . Marital status: Married    Spouse Reese: Not on file  . Number of children: Not on file  . Years of education: Not on file  . Highest education level: Not on file  Occupational History  . Not on file  Tobacco Use  . Smoking status: Never Smoker  . Smokeless tobacco: Never Used  Vaping Use  . Vaping Use: Never used  Substance and Sexual Activity  . Alcohol use: No  . Drug use: No  . Sexual activity: Not Currently    Birth control/protection: None  Other Topics Concern  . Not on file  Social History Narrative  . Not on file   Social Determinants of Health   Financial Resource Strain:   . Difficulty of Paying Living Expenses:   Food Insecurity:   . Worried About Programme researcher, broadcasting/film/video in the Last Year:   . Barista in the Last Year:   Transportation Needs:   . Freight forwarder (Medical):   Marland Kitchen Lack of Transportation (Non-Medical):   Physical Activity:   . Days of Exercise per Week:   . Minutes of Exercise per Session:   Stress:   . Feeling of Stress :    Social Connections:   . Frequency of Communication with Friends and Family:   . Frequency of Social Gatherings with Friends and Family:   . Attends Religious Services:   . Active Member of Clubs or Organizations:   . Attends Banker Meetings:   Marland Kitchen Marital Status:   Intimate Partner Violence:   . Fear of Current or Ex-Partner:   . Emotionally Abused:   Marland Kitchen Physically Abused:   . Sexually Abused:    No current facility-administered medications on file prior to encounter.   Current Outpatient Medications on File Prior to Encounter  Medication Sig Dispense Refill  . Ascorbic Acid (VITAMIN C PO) Take 1 tablet by mouth daily.    . promethazine (PHENERGAN) 12.5 MG tablet Take 1 tablet (12.5 mg total) by mouth every 6 (six) hours as needed for nausea or vomiting. 30 tablet 0  . ondansetron (ZOFRAN ODT) 8 MG disintegrating tablet Take 1 tablet (8 mg total) by mouth every 8 (eight) hours as needed for nausea or vomiting. 30 tablet 0  . Prenatal Vit-Fe Fumarate-FA (MULTIVITAMIN-PRENATAL) 27-0.8 MG TABS tablet Take 1 tablet by mouth daily.     No Known Allergies  ROS:  Review of Systems  Constitutional: Negative for activity change, appetite change, chills and fever.  HENT: Negative for congestion and sore throat.  Respiratory: Negative for cough, chest tightness and shortness of breath.   Cardiovascular: Negative for chest pain.  Gastrointestinal: Negative for abdominal pain, nausea and vomiting.  Genitourinary: Positive for vaginal pain. Negative for difficulty urinating, dyspareunia, dysuria, pelvic pain, vaginal bleeding and vaginal discharge.  Musculoskeletal: Negative for back pain.  Skin: Positive for rash.  Neurological: Negative for headaches.   I have reviewed patient's Past Medical Hx, Surgical Hx, Family Hx, Social Hx, medications and allergies.   Physical Exam   Patient Vitals for the past 24 hrs:  BP Temp Temp src Pulse Resp SpO2  11/25/19 1514 110/61 98.3 F  (36.8 C) Oral 88 20 100 %   Constitutional: Well-developed, well-nourished female in no acute distress.  Cardiovascular: normal rate Respiratory: normal effort GI: Abd soft, non-tender.  MS: Extremities nontender, no edema, normal ROM Neurologic: Alert and oriented x 4.   PELVIC EXAM: right, inferior external labia notable for 2cm x 1cm swollen, indurated area with central fluctuance. Minimal overlying redness and slight tenderness to palpation.  LAB RESULTS Results for orders placed or performed during the hospital encounter of 11/25/19 (from the past 24 hour(s))  Urinalysis, Routine w reflex microscopic Urine, Clean Catch     Status: Abnormal   Collection Time: 11/25/19  3:33 PM  Result Value Ref Range   Color, Urine YELLOW YELLOW   APPearance HAZY (A) CLEAR   Specific Gravity, Urine 1.027 1.005 - 1.030   pH 6.0 5.0 - 8.0   Glucose, UA >=500 (A) NEGATIVE mg/dL   Hgb urine dipstick NEGATIVE NEGATIVE   Bilirubin Urine NEGATIVE NEGATIVE   Ketones, ur NEGATIVE NEGATIVE mg/dL   Protein, ur NEGATIVE NEGATIVE mg/dL   Nitrite NEGATIVE NEGATIVE   Leukocytes,Ua NEGATIVE NEGATIVE   RBC / HPF 0-5 0 - 5 RBC/hpf   WBC, UA 0-5 0 - 5 WBC/hpf   Bacteria, UA RARE (A) NONE SEEN   Squamous Epithelial / LPF 0-5 0 - 5   Mucus PRESENT     --/--/O POS (06/18 1009)  IMAGING No results found.  MAU Management/MDM: Orders Placed This Encounter  Procedures  . Urinalysis, Routine w reflex microscopic Urine, Clean Catch  . Discharge patient    Meds ordered this encounter  Medications  . lidocaine (PF) (XYLOCAINE) 1 % injection 5 mL  . acetaminophen (TYLENOL) tablet 650 mg    Treatments in MAU included tylenol for vulvar pain. Subdermal lidocaine was also administered prior to I&D of vulvar abscess. Pt discharged with strict infection precautions.  ASSESSMENT & PLAN: 1. Vulvar abscess: Pt presented to MAU given concern of 3 days of vaginal pain and swelling. Exam notable for 2cm x 1cm  indurated mass with central fluctuance along right, inferior external labia. After shared decision making, pt elected for I&D of vulvar abscess (see Procedure note below) in addition to sitz bathes 3-4x daily. Will defer antibiotics given minimal drainage with I&D and no evidence of overlying skin infection. Plan for f/u prenatal visit as scheduled or sooner for signs/symptoms of infection as noted.   I&D Procedure Note: Discussed complications and possible outcomes of procedure including scarring leading to infecton, bleeding, dyspareunia, damage to surrounding structures. Offered the option of conservative management with observation and sitz bathes vs I&D as noted. Pt signed written consent prior to performing the procedure.  Patient was examined in the dorsal lithotomy position.  The area was prepped with Iodine and draped in a sterile manner. 1% Lidocaine (3 ml) was then used to infiltrate area along right, inferior  external labia.  A 5 mm incision was made using a sterile scapel. Upon palpation of the fluctuant mass, a minimal amount of bloody purulent drainage was expressed through the incision. Hemostasis was achieved with brief application of pressured gauze. No antibiotics were prescribed given minimal drainage s/p incision and no evidence of overlying skin infection. - Recommended Sitz baths bid, tylenol and chilled maxi-pads  prn for vulvar discomfort. She was told to call to be examined if she experiences increasing swelling, pain, vaginal discharge, or fever  Discharge home with strict infection precautions as noted above.  Allergies as of 11/25/2019   No Known Allergies     Medication List    TAKE these medications   multivitamin-prenatal 27-0.8 MG Tabs tablet Take 1 tablet by mouth daily.   ondansetron 8 MG disintegrating tablet Commonly known as: Zofran ODT Take 1 tablet (8 mg total) by mouth every 8 (eight) hours as needed for nausea or vomiting.   promethazine 12.5 MG  tablet Commonly known as: PHENERGAN Take 1 tablet (12.5 mg total) by mouth every 6 (six) hours as needed for nausea or vomiting.   VITAMIN C PO Take 1 tablet by mouth daily.       Lynnda Shields, MD OB Fellow, Faculty Practice 11/25/2019  4:42 PM

## 2019-12-05 ENCOUNTER — Other Ambulatory Visit: Payer: Self-pay

## 2019-12-05 ENCOUNTER — Ambulatory Visit (INDEPENDENT_AMBULATORY_CARE_PROVIDER_SITE_OTHER): Payer: 59 | Admitting: Medical

## 2019-12-05 ENCOUNTER — Encounter: Payer: Self-pay | Admitting: Medical

## 2019-12-05 VITALS — BP 112/72 | HR 85 | Wt 156.0 lb

## 2019-12-05 DIAGNOSIS — Z3492 Encounter for supervision of normal pregnancy, unspecified, second trimester: Secondary | ICD-10-CM

## 2019-12-05 DIAGNOSIS — Z3689 Encounter for other specified antenatal screening: Secondary | ICD-10-CM

## 2019-12-05 DIAGNOSIS — Z3A19 19 weeks gestation of pregnancy: Secondary | ICD-10-CM

## 2019-12-05 DIAGNOSIS — Z3A15 15 weeks gestation of pregnancy: Secondary | ICD-10-CM

## 2019-12-05 DIAGNOSIS — R87612 Low grade squamous intraepithelial lesion on cytologic smear of cervix (LGSIL): Secondary | ICD-10-CM

## 2019-12-05 MED ORDER — BLOOD PRESSURE KIT: 1.0000 [IU] | PACK | 0 refills | Status: DC

## 2019-12-05 NOTE — Progress Notes (Signed)
   PRENATAL VISIT NOTE  Subjective:  Lisa Reese is a 29 y.o. G2P1001 at 80w6dbeing seen today for her first prenatal visit for this pregnancy.  She is currently monitored for the following issues for this low-risk pregnancy and has Low grade squamous intraepith lesion on cytologic smear cervix (lgsil); Back pain affecting pregnancy; and Supervision of low-risk pregnancy, second trimester on their problem list.  Patient reports occasional round ligament pain.  Contractions: Not present. Vag. Bleeding: None.  Movement: Absent. Denies leaking of fluid.   She is planning to breastfeed. Desires IUD for contraception.   The following portions of the patient's history were reviewed and updated as appropriate: allergies, current medications, past family history, past medical history, past social history, past surgical history and problem list.   Objective:   Vitals:   12/05/19 0949  BP: 112/72  Pulse: 85  Weight: 156 lb (70.8 kg)    Fetal Status: Fetal Heart Rate (bpm): 146   Movement: Absent     General:  Alert, oriented and cooperative. Patient is in no acute distress.  Skin: Skin is warm and dry. No rash noted.   Cardiovascular: Normal heart rate and rhythm noted  Respiratory: Normal respiratory effort, no problems with respiration noted. Clear to auscultation.   Abdomen: Soft, gravid, appropriate for gestational age. Normal bowel sounds. Non-tender. Pain/Pressure: Present     Pelvic: Cervical exam deferred       Normal cervical contour, no lesions, no bleeding following pap, normal discharge  Extremities: Normal range of motion.     Mental Status: Normal mood and affect. Normal behavior. Normal judgment and thought content.   Assessment and Plan:  Pregnancy: G2P1001 at 129w6d. Supervision of low-risk pregnancy, second trimester - Genetic Screening - Panorama and Horizon - CBC/D/Plt+RPR+Rh+ABO+Rub Ab... - Culture, OB Urine - GC/Chlamydia probe amp ([place])not at  [facility] Hem[facility] A1[facility] - Babyscripts Schedule Optimization - AFP, Serum, Open Spina Bifida - Blood Pressure KIT; 1 Units by Does not apply route once a week. To monitor BP weekly for low risk pregnancy (z34.90)  Dispense: 1 kit; Refill: 0  2. Low grade squamous intraepith lesion on cytologic smear cervix (lgsil) - Normal repeat pap 2019, next pap due PP  3. [redacted] weeks gestation of pregnancy  4. Encounter for fetal anatomic survey - [place] OB COMP + 14 WK; Future  The patient was counseled on the potential benefits and lack of known risks of COVID vaccination, during pregnancy and breastfeeding, on today's visit. The patient's questions and concerns were addressed today, including safety in pregnancy. The patient is planning to get vaccinated and was advised of how to schedule her vaccination appointment.    Preterm labor/second trimester warning symptoms and general obstetric precautions including but not limited to vaginal bleeding, contractions, leaking of fluid and fetal movement were reviewed in detail with the patient. Please refer to After Visit Summary for other counseling recommendations.   Discussed the normal visit cadence for prenatal care Discussed the nature of our practice with multiple providers including residents and students   Return in about 4 weeks (around 01/02/2020) for LOB, Virtual.  Future Appointments  Date Time Provider [place]9/16/2021  8:55 AM LaJorje GuildNP [facility]9/22/2021  9:45 AM [facility] US4 [facility] WMHamilton  JuKerry HoughPA-C

## 2019-12-05 NOTE — Patient Instructions (Signed)
Safe Medications in Pregnancy   Acne:  Benzoyl Peroxide  Salicylic Acid   Backache/Headache:  Tylenol: 2 regular strength every 4 hours OR        2 Extra strength every 6 hours   Colds/Coughs/Allergies:  Benadryl (alcohol free) 25 mg every 6 hours as needed  Breath right strips  Claritin  Cepacol throat lozenges  Chloraseptic throat spray  Cold-Eeze- up to three times per day  Cough drops, alcohol free  Flonase (by prescription only)  Guaifenesin  Mucinex  Robitussin DM (plain only, alcohol free)  Saline nasal spray/drops  Sudafed (pseudoephedrine) & Actifed * use only after [redacted] weeks gestation and if you do not have high blood pressure  Tylenol  Vicks Vaporub  Zinc lozenges  Zyrtec   Constipation:  Colace  Ducolax suppositories  Fleet enema  Glycerin suppositories  Metamucil  Milk of magnesia  Miralax  Senokot  Smooth move tea   Diarrhea:  Kaopectate  Imodium A-D   *NO pepto Bismol   Hemorrhoids:  Anusol  Anusol HC  Preparation H  Tucks   Indigestion:  Tums  Maalox  Mylanta  Zantac  Pepcid   Insomnia:  Benadryl (alcohol free) 25mg every 6 hours as needed  Tylenol PM  Unisom, no Gelcaps   Leg Cramps:  Tums  MagGel   Nausea/Vomiting:  Bonine  Dramamine  Emetrol  Ginger extract  Sea bands  Meclizine  Nausea medication to take during pregnancy:  Unisom (doxylamine succinate 25 mg tablets) Take one tablet daily at bedtime. If symptoms are not adequately controlled, the dose can be increased to a maximum recommended dose of two tablets daily (1/2 tablet in the morning, 1/2 tablet mid-afternoon and one at bedtime).  Vitamin B6 100mg tablets. Take one tablet twice a day (up to 200 mg per day).   Skin Rashes:  Aveeno products  Benadryl cream or 25mg every 6 hours as needed  Calamine Lotion  1% cortisone cream   Yeast infection:  Gyne-lotrimin 7  Monistat 7    **If taking multiple medications, please check labels to avoid  duplicating the same active ingredients  **take medication as directed on the label  ** Do not exceed 4000 mg of tylenol in 24 hours  **Do not take medications that contain aspirin or ibuprofen         Childbirth Education Options: [facility] Classes:  Childbirth education classes can help you get ready for a positive parenting experience. You can also meet other expectant parents and get free stuff for your baby. Each class runs for five weeks on the same night and costs $45 for the mother-to-be and her support person. Medicaid covers the cost if you are eligible. Call [phone] to register. [facility] Childbirth Education:  [phone] or [phone] or [email or web address]  Baby & Me Class: Discuss newborn & infant parenting and family adjustment issues with other new mothers in a relaxed environment. Each week brings a new speaker or baby-centered activity. We encourage new mothers to join us every Thursday at 11:00am. Babies birth until crawling. No registration or fee. Daddy Boot Camp: This course offers Dads-to-be the tools and knowledge needed to feel confident on their journey to becoming new fathers. Experienced dads, who have been trained as coaches, teach dads-to-be how to hold, comfort, diaper, swaddle and play with their infant while being able to support the new mom as well. A class for men taught by men. $25/dad Big Brother/Big Sister: Let your children share in the joy   mothers as they journey through the adjustments and struggles of that sometimes overwhelming first year after the birth of a child. Tuesdays at 10:00am and  Thursdays at 6:00pm. Babies welcome. No registration or fee. Breastfeeding Support Group: This group is a mother-to-mother support circle where moms have the opportunity to share their breastfeeding experiences. A Lactation Consultant is present for questions and concerns. Meets each Tuesday at 11:00am. No fee or registration. Breastfeeding Your Baby: Learn what to expect in the first days of breastfeeding your newborn.  This class will help you feel more confident with the skills needed to begin your breastfeeding experience. Many new mothers are concerned about breastfeeding after leaving the hospital. This class will also address the most common fears and challenges about breastfeeding during the first few weeks, months and beyond. (call for fee) Comfort Techniques and Tour: This 2 hour interactive class will provide you the opportunity to learn & practice hands-on techniques that can help relieve some of the discomfort of labor and encourage your baby to rotate toward the best position for birth. You and your partner will be able to try a variety of labor positions with birth balls and rebozos as well as practice breathing, relaxation, and visualization techniques. A tour of the [facility] is included with this class. $20 per registrant and support person Childbirth Class- Weekend Option: This class is a Weekend version of our Birth & Baby series. It is designed for parents who have a difficult time fitting several weeks of classes into their schedule. It covers the care of your newborn and the basics of labor and childbirth. It also includes a Maternity Care Center Tour of [facility] and lunch. The class is held two consecutive days: beginning on Friday evening from 6:30 - 8:30 p.m. and the next day, Saturday from 9 a.m. - 4 p.m. (call for fee) Waterbirth Class: Interested in a waterbirth?  This informational class will help you discover whether waterbirth is the right fit  for you. Education about waterbirth itself, supplies you would need and how to assemble your support team is what you can expect from this class. Some obstetrical practices require this class in order to pursue a waterbirth. (Not all obstetrical practices offer waterbirth-check with your healthcare provider.) Register only the expectant mom, but you are encouraged to bring your partner to class! Required if planning waterbirth, no fee. Infant/Child CPR: Parents, grandparents, babysitters, and friends learn Cardio-Pulmonary Resuscitation skills for infants and children. You will also learn how to treat both conscious and unconscious choking in infants and children. This Family & Friends program does not offer certification. Register each participant individually to ensure that enough mannequins are available. (Call for fee) Grandparent Love: Expecting a grandbaby? This class is for you! Learn about the latest infant care and safety recommendations and ways to support your own child as he or she transitions into the parenting role. Taught by Registered Nurses who are childbirth instructors, but most importantly...they are grandmothers too! $10/person. Childbirth Class- Natural Childbirth: This series of 5 weekly classes is for expectant parents who want to learn and practice natural methods of coping with the process of labor and childbirth. Relaxation, breathing, massage, visualization, role of the partner, and helpful positioning are highlighted. Participants learn how to be confident in their body's ability to give birth. This class will empower and help parents make informed decisions about their own care. Includes discussion that will help new parents transition into the immediate postpartum period. Maternity   who want to learn and practice natural methods of coping with the process of labor and childbirth. Relaxation, breathing, massage, visualization, role of the partner, and helpful positioning are highlighted. Participants learn how to be confident in their body's ability to give birth. This class will empower and help parents make informed decisions about their own care. Includes discussion that will help new parents transition into the immediate postpartum period. Maternity Care Center Tour of [facility] is included. We suggest taking this class between 25-32 weeks, but  it's only a recommendation. $75 per registrant and one support person or $30 Medicaid. Childbirth Class- 3 week Series: This option of 3 weekly classes helps you and your labor partner prepare for childbirth. Newborn care, labor & birth, cesarean birth, pain management, and comfort techniques are discussed and a Maternity Care Center Tour of [facility] is included. The class meets at the same time, on the same day of the week for 3 consecutive weeks beginning with the starting date you choose. $60 for registrant and one support person.  Marvelous Multiples: Expecting twins, triplets, or more? This class covers the differences in labor, birth, parenting, and breastfeeding issues that face multiples' parents. NICU tour is included. Led by a Certified Childbirth Educator who is the mother of twins. No fee. Caring for Baby: This class is for expectant and adoptive parents who want to learn and practice the most up-to-date newborn care for their babies. Focus is on birth through the first six weeks of life. Topics include feeding, bathing, diapering, crying, umbilical cord care, circumcision care and safe sleep. Parents learn to recognize symptoms of illness and when to call the pediatrician. Register only the mom-to-be and your partner or support person can plan to come with you! $10 per registrant and support person Childbirth Class- online option: This online class offers you the freedom to complete a Birth and Baby series in the comfort of your own home. The flexibility of this option allows you to review sections at your own pace, at times convenient to you and your support people. It includes additional video information, animations, quizzes, and extended activities. Get organized with helpful eClass tools, checklists, and trackers. Once you register online for the class, you will receive an email within a few days to accept the invitation and begin the class when the time is right for you. The content  will be available to you for 60 days. $60 for 60 days of online access for you and your support people.         

## 2019-12-06 LAB — CBC/D/PLT+RPR+RH+ABO+RUB AB...
Antibody Screen: NEGATIVE
Basophils Absolute: 0 10*3/uL (ref 0.0–0.2)
Basos: 1 %
EOS (ABSOLUTE): 0 10*3/uL (ref 0.0–0.4)
Eos: 0 %
HCV Ab: <0.1 s/co ratio (ref 0.0–0.9)
HIV Screen 4th Generation wRfx: NONREACTIVE
Hematocrit: 32 % — ABNORMAL LOW (ref 34.0–46.6)
Hemoglobin: 10.6 g/dL — ABNORMAL LOW (ref 11.1–15.9)
Hepatitis B Surface Ag: NEGATIVE
Immature Grans (Abs): 0 10*3/uL (ref 0.0–0.1)
Immature Granulocytes: 0 %
Lymphocytes Absolute: 1.3 10*3/uL (ref 0.7–3.1)
Lymphs: 19 %
MCH: 31 pg (ref 26.6–33.0)
MCHC: 33.1 g/dL (ref 31.5–35.7)
MCV: 94 fL (ref 79–97)
Monocytes Absolute: 0.5 10*3/uL (ref 0.1–0.9)
Monocytes: 8 %
Neutrophils Absolute: 4.9 10*3/uL (ref 1.4–7.0)
Neutrophils: 72 %
Platelets: 301 10*3/uL (ref 150–450)
RBC: 3.42 x10E6/uL — ABNORMAL LOW (ref 3.77–5.28)
RDW: 13.3 % (ref 11.7–15.4)
RPR Ser Ql: NONREACTIVE
Rh Factor: POSITIVE
Rubella Antibodies, IGG: 9.74 index (ref >0.99)
WBC: 6.8 10*3/uL (ref 3.4–10.8)

## 2019-12-06 LAB — AFP, SERUM, OPEN SPINA BIFIDA
AFP MoM: 2.13
AFP Value: 67.7 ng/mL
Gest. Age on Collection Date: 15.6 weeks
Maternal Age At EDD: 27.8 yr
OSBR Risk 1 IN: 607
Test Results:: NEGATIVE
Weight: 156 [lb_av]

## 2019-12-06 LAB — HEMOGLOBIN A1C
Est. average glucose Bld gHb Est-mCnc: 94 mg/dL
Hgb A1c MFr Bld: 4.9 % (ref 4.8–5.6)

## 2019-12-06 LAB — HCV INTERPRETATION

## 2019-12-07 LAB — CULTURE, OB URINE

## 2019-12-07 LAB — URINE CULTURE, OB REFLEX

## 2019-12-19 ENCOUNTER — Encounter: Payer: Self-pay | Admitting: General Practice

## 2020-01-02 ENCOUNTER — Encounter: Payer: Self-pay | Admitting: Student

## 2020-01-02 ENCOUNTER — Other Ambulatory Visit: Payer: Self-pay

## 2020-01-02 ENCOUNTER — Encounter: Payer: 59 | Admitting: Student

## 2020-01-02 ENCOUNTER — Ambulatory Visit (INDEPENDENT_AMBULATORY_CARE_PROVIDER_SITE_OTHER): Payer: 59 | Admitting: Obstetrics and Gynecology

## 2020-01-02 VITALS — BP 117/75 | HR 98 | Wt 167.6 lb

## 2020-01-02 DIAGNOSIS — Z3492 Encounter for supervision of normal pregnancy, unspecified, second trimester: Secondary | ICD-10-CM

## 2020-01-02 DIAGNOSIS — Z3482 Encounter for supervision of other normal pregnancy, second trimester: Secondary | ICD-10-CM

## 2020-01-02 DIAGNOSIS — Z3A19 19 weeks gestation of pregnancy: Secondary | ICD-10-CM

## 2020-01-02 DIAGNOSIS — Z23 Encounter for immunization: Secondary | ICD-10-CM | POA: Diagnosis not present

## 2020-01-02 DIAGNOSIS — K429 Umbilical hernia without obstruction or gangrene: Secondary | ICD-10-CM | POA: Insufficient documentation

## 2020-01-02 NOTE — Progress Notes (Signed)
u  PRENATAL VISIT NOTE  Subjective:  Lisa Reese is a 29 y.o. G2P1001 at [redacted]w[redacted]d being seen today for ongoing prenatal care.  She is currently monitored for the following issues for this low-risk pregnancy and has Low grade squamous intraepith lesion on cytologic smear cervix (lgsil); Back pain affecting pregnancy; Supervision of low-risk pregnancy, second trimester; and Umbilical hernia without obstruction and without gangrene on their problem list.  Patient reports no complaints.  Contractions: Not present. Vag. Bleeding: None.  Movement: Absent. Denies leaking of fluid.   The following portions of the patient's history were reviewed and updated as appropriate: allergies, current medications, past family history, past medical history, past social history, past surgical history and problem list.   Objective:   Vitals:   01/02/20 1329  BP: 117/75  Pulse: 98  Weight: 167 lb 9.6 oz (76 kg)    Fetal Status: Fetal Heart Rate (bpm): 144 Fundal Height: 20 cm Movement: Absent     General:  Alert, oriented and cooperative. Patient is in no acute distress.  Skin: Skin is warm and dry. No rash noted.   Cardiovascular: Normal heart rate noted  Respiratory: Normal respiratory effort, no problems with respiration noted  Abdomen: Soft, gravid, appropriate for gestational age.  Pain/Pressure: Present     Pelvic: Cervical exam deferred        Extremities: Normal range of motion.  Edema: None  Mental Status: Normal mood and affect. Normal behavior. Normal judgment and thought content.   Assessment and Plan:  Pregnancy: G2P1001 at [redacted]w[redacted]d   1. Supervision of low-risk pregnancy, second trimester - [facility] AMB BABYSCRIPTS OPT IN - Flu Vaccine QUAD 36+ mos IM  2. Umbilical hernia without obstruction and without gangrene  Easily reducible would consider a referral to general surgery PP [place] scheduled on 9/22- anatomy scan.   Preterm labor symptoms and general obstetric precautions including but  not limited to vaginal bleeding, contractions, leaking of fluid and fetal movement were reviewed in detail with the patient. Please refer to After Visit Summary for other counseling recommendations.   No follow-ups on file.  Future Appointments  Date Time Provider Department Center  01/08/2020  9:45 AM [facility]  01/30/2020  1:55 PM Marylene Land, CNM [facility]    Venia Carbon, NP

## 2020-01-02 NOTE — Patient Instructions (Signed)
Sleep remedy's   Benadryl  Unisom Magnesium

## 2020-01-08 ENCOUNTER — Other Ambulatory Visit: Payer: Self-pay

## 2020-01-08 ENCOUNTER — Ambulatory Visit: Payer: 59 | Attending: Medical

## 2020-01-08 DIAGNOSIS — Z3492 Encounter for supervision of normal pregnancy, unspecified, second trimester: Secondary | ICD-10-CM

## 2020-01-08 DIAGNOSIS — Z3689 Encounter for other specified antenatal screening: Secondary | ICD-10-CM | POA: Diagnosis present

## 2020-01-08 DIAGNOSIS — Z3A19 19 weeks gestation of pregnancy: Secondary | ICD-10-CM | POA: Diagnosis present

## 2020-01-08 DIAGNOSIS — K429 Umbilical hernia without obstruction or gangrene: Secondary | ICD-10-CM

## 2020-01-08 MED ORDER — ABDOMINAL SUPPORT/L-XL MISC: 1.0000 | 0 refills | Status: DC | PRN

## 2020-01-08 NOTE — Progress Notes (Signed)
Patient came to front office for support belt.

## 2020-01-30 ENCOUNTER — Ambulatory Visit (INDEPENDENT_AMBULATORY_CARE_PROVIDER_SITE_OTHER): Payer: 59 | Admitting: Student

## 2020-01-30 ENCOUNTER — Other Ambulatory Visit: Payer: Self-pay

## 2020-01-30 VITALS — BP 101/57 | HR 76 | Wt 175.0 lb

## 2020-01-30 DIAGNOSIS — Z3492 Encounter for supervision of normal pregnancy, unspecified, second trimester: Secondary | ICD-10-CM | POA: Diagnosis not present

## 2020-01-30 DIAGNOSIS — O99019 Anemia complicating pregnancy, unspecified trimester: Secondary | ICD-10-CM | POA: Insufficient documentation

## 2020-01-30 DIAGNOSIS — O99012 Anemia complicating pregnancy, second trimester: Secondary | ICD-10-CM

## 2020-01-30 DIAGNOSIS — K219 Gastro-esophageal reflux disease without esophagitis: Secondary | ICD-10-CM

## 2020-01-30 MED ORDER — FAMOTIDINE 20 MG PO TABS
20.0000 mg | ORAL_TABLET | Freq: Two times a day (BID) | ORAL | 3 refills | Status: DC
Start: 2020-01-30 — End: 2020-04-22

## 2020-01-30 MED ORDER — IRON (FERROUS SULFATE) 325 (65 FE) MG PO TABS: 1.0000 | ORAL_TABLET | ORAL | 3 refills | Status: DC

## 2020-01-30 NOTE — Progress Notes (Addendum)
   PRENATAL VISIT NOTE  Subjective:  Lisa Reese is a 29 y.o. G2P1001 at [redacted]w[redacted]d being seen today for ongoing prenatal care.  She is currently monitored for the following issues for this low-risk pregnancy and has Low grade squamous intraepith lesion on cytologic smear cervix (lgsil); Back pain affecting pregnancy; Supervision of low-risk pregnancy, second trimester; Umbilical hernia without obstruction and without gangrene; [redacted] weeks gestation of pregnancy; Anemia affecting pregnancy; and Gastric reflux on their problem list.  Patient reports feeling fatigued throughout the day despite sitting. Her diet consists of a majority of carbohydrates and she does not snack in between meals out of fear of gaining more weight. The patient says that she still feels mild discomfort around her umbilicus. She states that she has started to experience mild gastric reflux that is exacerbated with acidic foods.  The following portions of the patient's history were reviewed and updated as appropriate: allergies, current medications, past family history, past medical history, past social history, past surgical history and problem list.   Objective:   Vitals:   01/30/20 1408  BP: (!) 101/57  Pulse: 76  Weight: 175 lb (79.4 kg)    Fetal Status: Fetal Heart Rate (bpm): 155 Fundal Height: 26 cm Movement: Present   Fundal Height: 26 cm Movement: Absent  General:  Alert, oriented and cooperative. Patient is in no acute distress.  Skin: Skin is warm and dry. No rash noted.   Cardiovascular: Normal heart rate noted  Respiratory: Normal respiratory effort, no problems with respiration noted  Abdomen: Soft, gravid, appropriate for gestational age.  Pain/Pressure: Present     Pelvic: Cervical exam deferred        Extremities: Normal range of motion.  Edema: None  Mental Status: Normal mood and affect. Normal behavior. Normal judgment and thought content.   Assessment and Plan:  Pregnancy: G2P1001 at  [redacted]w[redacted]d   1. Supervision of low-risk pregnancy, second trimester   2. Anemia affecting pregnancy in second trimester  -Started ferrous sulfate -Ordered CBC -continue to monitor anemia  3. Gastric reflux  - Famotidine prescribed for use prn       Preterm labor symptoms and general obstetric precautions including but not limited to vaginal bleeding, contractions, leaking of fluid and fetal movement were reviewed in detail with the patient. Please refer to After Visit Summary for other counseling recommendations.   Discussed the importance of incorporating more protein into her diet to reduce her complaint of fatigue and prevent gestational diabetes.    Return in about 4 weeks (around 02/27/2020), or LROB in person with KK for 2 hour gtt.  Future Appointments  Date Time Provider Department Center  03/04/2020  8:20 AM [facility]  03/04/2020 10:15 AM Marylene Land, CNM [facility]    Marylene Land, CNM    Attestation of Supervision of Student:  I confirm that I have verified the information documented in the physician assistant student's note and that I have also personally reperformed the history, physical exam and all medical decision making activities.  I have verified that all services and findings are accurately documented in this student's note; and I agree with management and plan as outlined in the documentation. I have also made any necessary editorial changes.  -Recommended healthy snacks, discussed upcoming glucose tolerance test  Marylene Land, CNM [facility], [facility] 01/30/2020 5:01 PM

## 2020-01-30 NOTE — Patient Instructions (Addendum)
Eating Plan for Pregnant Women While you are pregnant, your body requires additional nutrition to help support your growing baby. You also have a higher need for some vitamins and minerals, such as folic acid, calcium, iron, and vitamin D. Eating a healthy, well-balanced diet is very important for your health and your baby's health. Your need for extra calories varies for the three 80-monthsegments of your pregnancy (trimesters). For most women, it is recommended to consume:  150 extra calories a day during the first trimester.  300 extra calories a day during the second trimester.  300 extra calories a day during the third trimester. What are tips for following this plan?   Do not try to lose weight or go on a diet during pregnancy.  Limit your overall intake of foods that have "empty calories." These are foods that have little nutritional value, such as sweets, desserts, candies, and sugar-sweetened beverages.  Eat a variety of foods (especially fruits and vegetables) to get a full range of vitamins and minerals.  Take a prenatal vitamin to help meet your additional vitamin and mineral needs during pregnancy, specifically for folic acid, iron, calcium, and vitamin D.  Remember to stay active. Ask your health care provider what types of exercise and activities are safe for you.  Practice good food safety and cleanliness. Wash your hands before you eat and after you prepare raw meat. Wash all fruits and vegetables well before peeling or eating. Taking these actions can help to prevent food-borne illnesses that can be very dangerous to your baby, such as listeriosis. Ask your health care provider for more information about listeriosis. What does 150 extra calories look like? Healthy options that provide 150 extra calories each day could be any of the following:  6-8 oz (170-230 g) of plain low-fat yogurt with  cup of berries.  1 apple with 2 teaspoons (11 g) of peanut butter.  Cut-up  vegetables with  cup (60 g) of hummus.  8 oz (230 mL) or 1 cup of low-fat chocolate milk.  1 stick of string cheese with 1 medium orange.  1 peanut butter and jelly sandwich that is made with one slice of whole-wheat bread and 1 tsp (5 g) of peanut butter. For 300 extra calories, you could eat two of those healthy options each day. What is a healthy amount of weight to gain? The right amount of weight gain for you is based on your BMI before you became pregnant. If your BMI:  Was less than 18 (underweight), you should gain 28-40 lb (13-18 kg).  Was 18-24.9 (normal), you should gain 25-35 lb (11-16 kg).  Was 25-29.9 (overweight), you should gain 15-25 lb (7-11 kg).  Was 30 or greater (obese), you should gain 11-20 lb (5-9 kg). What if I am having twins or multiples? Generally, if you are carrying twins or multiples:  You may need to eat 300-600 extra calories a day.  The recommended range for total weight gain is 25-54 lb (11-25 kg), depending on your BMI before pregnancy.  Talk with your health care provider to find out about nutritional needs, weight gain, and exercise that is right for you. What foods can I eat?  Fruits All fruits. Eat a variety of colors and types of fruit. Remember to wash your fruits well before peeling or eating. Vegetables All vegetables. Eat a variety of colors and types of vegetables. Remember to wash your vegetables well before peeling or eating. Grains All grains. Choose whole grains, such  as whole-wheat bread, oatmeal, or brown rice. Meats and other protein foods Lean meats, including chicken, [place], fish, and lean cuts of beef, veal, or pork. If you eat fish or seafood, choose options that are higher in omega-3 fatty acids and lower in mercury, such as salmon, herring, mussels, trout, sardines, pollock, shrimp, crab, and lobster. Tofu. Tempeh. Beans. Eggs. Peanut butter and other nut butters. Make sure that all meats, poultry, and eggs are cooked to  food-safe temperatures or "well-done." Two or more servings of fish are recommended each week in order to get the most benefits from omega-3 fatty acids that are found in seafood. Choose fish that are lower in mercury. You can find more information online:  [employer]Gaming.ch Dairy Pasteurized milk and milk alternatives (such as almond milk). Pasteurized yogurt and pasteurized cheese. Cottage cheese. Sour cream. Beverages Water. Juices that contain 100% fruit juice or vegetable juice. Caffeine-free teas and decaffeinated coffee. Drinks that contain caffeine are okay to drink, but it is better to avoid caffeine. Keep your total caffeine intake to less than 200 mg each day (which is 12 oz or 355 mL of coffee, tea, or soda) or the limit as told by your health care provider. Fats and oils Fats and oils are okay to include in moderation. Sweets and desserts Sweets and desserts are okay to include in moderation. Seasoning and other foods All pasteurized condiments. The items listed above may not be a complete list of foods and beverages you can eat. Contact a dietitian for more information. What foods are not recommended? Fruits Unpasteurized fruit juices. Vegetables Raw (unpasteurized) vegetable juices. Meats and other protein foods Lunch meats, bologna, hot dogs, or other deli meats. (If you must eat those meats, reheat them until they are steaming hot.) Refrigerated pat, meat spreads from a meat counter, smoked seafood that is found in the refrigerated section of a store. Raw or undercooked meats, poultry, and eggs. Raw fish, such as sushi or sashimi. Fish that have high mercury content, such as tilefish, shark, swordfish, and king mackerel. To learn more about mercury in fish, talk with your health care provider or look for online resources, such as:  [employer]Gaming.ch Dairy Raw (unpasteurized) milk and any foods that have raw milk in them. Soft cheeses, such as feta, queso blanco, queso fresco, Brie,  Camembert cheeses, blue-veined cheeses, and Panela cheese (unless it is made with pasteurized milk, which must be stated on the label). Beverages Alcohol. Sugar-sweetened beverages, such as sodas, teas, or energy drinks. Seasoning and other foods Homemade fermented foods and drinks, such as pickles, sauerkraut, or kombucha drinks. (Store-bought pasteurized versions of these are okay.) Salads that are made in a store or deli, such as ham salad, chicken salad, egg salad, tuna salad, and seafood salad. The items listed above may not be a complete list of foods and beverages you should avoid. Contact a dietitian for more information. Where to find more information To calculate the number of calories you need based on your height, weight, and activity level, you can use an online calculator such as:  MobileTransition.ch To calculate how much weight you should gain during pregnancy, you can use an online pregnancy weight gain calculator such as:  StreamingFood.com.cy Summary  While you are pregnant, your body requires additional nutrition to help support your growing baby.  Eat a variety of foods, especially fruits and vegetables to get a full range of vitamins and minerals.  Practice good food safety and cleanliness. Wash your hands before you eat  and after you prepare raw meat. Wash all fruits and vegetables well before peeling or eating. Taking these actions can help to prevent food-borne illnesses, such as listeriosis, that can be very dangerous to your baby.  Do not eat raw meat or fish. Do not eat fish that have high mercury content, such as tilefish, shark, swordfish, and king mackerel. Do not eat unpasteurized (raw) dairy. Take a prenatal vitamin to help meet your additional vitamin and mineral needs during pregnancy, specifically for folic acid, iron, calcium, and vita Glucose Tolerance Test During Pregnancy Why am I having this test? The  glucose tolerance test (GTT) is done to check how your body processes sugar (glucose). This is one of several tests used to diagnose diabetes that develops during pregnancy (gestational diabetes mellitus). Gestational diabetes is a temporary form of diabetes that some women develop during pregnancy. It usually occurs during the second trimester of pregnancy and goes away after delivery. Testing (screening) for gestational diabetes usually occurs between 24 and 28 weeks of pregnancy. You may have the GTT test after having a 1-hour glucose screening test if the results from that test indicate that you may have gestational diabetes. You may also have this test if: You have a history of gestational diabetes. You have a history of giving birth to very large babies or have experienced repeated fetal loss (stillbirth). You have signs and symptoms of diabetes, such as: Changes in your vision. Tingling or numbness in your hands or feet. Changes in hunger, thirst, and urination that are not otherwise explained by your pregnancy. What is being tested? This test measures the amount of glucose in your blood at different times during a period of 3 hours. This indicates how well your body is able to process glucose. What kind of sample is taken?  Blood samples are required for this test. They are usually collected by inserting a needle into a blood vessel. How do I prepare for this test? For 3 days before your test, eat normally. Have plenty of carbohydrate-rich foods. Follow instructions from your health care provider about: Eating or drinking restrictions on the day of the test. You may be asked to not eat or drink anything other than water (fast) starting 8-10 hours before the test. Changing or stopping your regular medicines. Some medicines may interfere with this test. Tell a health care provider about: All medicines you are taking, including vitamins, herbs, eye drops, creams, and over-the-counter  medicines. Any blood disorders you have. Any surgeries you have had. Any medical conditions you have. What happens during the test? First, your blood glucose will be measured. This is referred to as your fasting blood glucose, since you fasted before the test. Then, you will drink a glucose solution that contains a certain amount of glucose. Your blood glucose will be measured again 1, 2, and 3 hours after drinking the solution. This test takes about 3 hours to complete. You will need to stay at the testing location during this time. During the testing period: Do not eat or drink anything other than the glucose solution. Do not exercise. Do not use any products that contain nicotine or tobacco, such as cigarettes and e-cigarettes. If you need help stopping, ask your health care provider. The testing procedure may vary among health care providers and hospitals. How are the results reported? Your results will be reported as milligrams of glucose per deciliter of blood (mg/dL) or millimoles per liter (mmol/L). Your health care provider will compare your results to  normal ranges that were established after testing a large group of people (reference ranges). Reference ranges may vary among labs and hospitals. For this test, common reference ranges are: Fasting: less than 95-105 mg/dL (9.5-1.8 mmol/L). 1 hour after drinking glucose: less than 180-190 mg/dL (84.1-66.0 mmol/L). 2 hours after drinking glucose: less than 155-165 mg/dL (6.3-0.1 mmol/L). 3 hours after drinking glucose: 140-145 mg/dL (6.0-1.0 mmol/L). What do the results mean? Results within reference ranges are considered normal, meaning that your glucose levels are well-controlled. If two or more of your blood glucose levels are high, you may be diagnosed with gestational diabetes. If only one level is high, your health care provider may suggest repeat testing or other tests to confirm a diagnosis. Talk with your health care provider about  what your results mean. Questions to ask your health care provider Ask your health care provider, or the department that is doing the test: When will my results be ready? How will I get my results? What are my treatment options? What other tests do I need? What are my next steps? Summary The glucose tolerance test (GTT) is one of several tests used to diagnose diabetes that develops during pregnancy (gestational diabetes mellitus). Gestational diabetes is a temporary form of diabetes that some women develop during pregnancy. You may have the GTT test after having a 1-hour glucose screening test if the results from that test indicate that you may have gestational diabetes. You may also have this test if you have any symptoms or risk factors for gestational diabetes. Talk with your health care provider about what your results mean. This information is not intended to replace advice given to you by your health care provider. Make sure you discuss any questions you have with your health care provider. Document Revised: 07/26/2018 Document Reviewed: 11/14/2016 Elsevier Patient Education  2020 Elsevier Inc.  min D. This information is not intended to replace advice given to you by your health care provider. Make sure you discuss any questions you have with your health care provider. Document Revised: 08/23/2018 Document Reviewed: 12/30/2016 Elsevier Patient Education  2020 [employer].

## 2020-01-30 NOTE — Progress Notes (Signed)
Medicaid Home Form Completed-01/30/20

## 2020-01-31 LAB — CBC
Hematocrit: 33.3 % — ABNORMAL LOW (ref 34.0–46.6)
Hemoglobin: 10.7 g/dL — ABNORMAL LOW (ref 11.1–15.9)
MCH: 30.1 pg (ref 26.6–33.0)
MCHC: 32.1 g/dL (ref 31.5–35.7)
MCV: 94 fL (ref 79–97)
Platelets: 244 10*3/uL (ref 150–450)
RBC: 3.55 x10E6/uL — ABNORMAL LOW (ref 3.77–5.28)
RDW: 12.9 % (ref 11.7–15.4)
WBC: 9.2 10*3/uL (ref 3.4–10.8)

## 2020-02-26 ENCOUNTER — Other Ambulatory Visit: Payer: Self-pay

## 2020-02-26 DIAGNOSIS — Z3492 Encounter for supervision of normal pregnancy, unspecified, second trimester: Secondary | ICD-10-CM

## 2020-03-04 ENCOUNTER — Other Ambulatory Visit: Payer: Self-pay

## 2020-03-04 ENCOUNTER — Other Ambulatory Visit: Payer: 59

## 2020-03-04 ENCOUNTER — Ambulatory Visit (INDEPENDENT_AMBULATORY_CARE_PROVIDER_SITE_OTHER): Payer: 59 | Admitting: Student

## 2020-03-04 VITALS — BP 111/54 | HR 96 | Wt 178.0 lb

## 2020-03-04 DIAGNOSIS — Z3A28 28 weeks gestation of pregnancy: Secondary | ICD-10-CM

## 2020-03-04 DIAGNOSIS — Z23 Encounter for immunization: Secondary | ICD-10-CM | POA: Diagnosis not present

## 2020-03-04 DIAGNOSIS — Z3492 Encounter for supervision of normal pregnancy, unspecified, second trimester: Secondary | ICD-10-CM

## 2020-03-04 NOTE — Progress Notes (Signed)
tdap vaccine given  

## 2020-03-04 NOTE — Progress Notes (Signed)
Patient ID: Lisa Reese, female   DOB: 1990/08/23, 29 y.o.   MRN: [id number]   PRENATAL VISIT NOTE  Subjective:  Lisa Reese is a 29 y.o. G2P1001 at [redacted]w[redacted]d being seen today for ongoing prenatal care.  She is currently monitored for the following issues for this low-risk pregnancy and has Low grade squamous intraepith lesion on cytologic smear cervix (lgsil); Back pain affecting pregnancy; Supervision of low-risk pregnancy, second trimester; Umbilical hernia without obstruction and without gangrene; [redacted] weeks gestation of pregnancy; Anemia affecting pregnancy; and Gastric reflux on their problem list.  Patient reports no complaints. States that she is "doing good". Nausea and heartburn are improved with medicine.   Contractions: Not present. Vag. Bleeding: None.  Movement: Present. Denies leaking of fluid.   The following portions of the patient's history were reviewed and updated as appropriate: allergies, current medications, past family history, past medical history, past social history, past surgical history and problem list.   Objective:   Vitals:   03/04/20 0858  BP: (!) 111/54  Pulse: 96  Weight: 178 lb (80.7 kg)    Fetal Status: Fetal Heart Rate (bpm): 146 Fundal Height: 31 cm Movement: Present     General:  Alert, oriented and cooperative. Patient is in no acute distress.  Skin: Skin is warm and dry. No rash noted.   Cardiovascular: Normal heart rate noted  Respiratory: Normal respiratory effort, no problems with respiration noted  Abdomen: Soft, gravid, appropriate for gestational age.  Pain/Pressure: Present     Pelvic: Cervical exam deferred        Extremities: Normal range of motion.  Edema: None  Mental Status: Normal mood and affect. Normal behavior. Normal judgment and thought content.   Assessment and Plan:  Pregnancy: G2P1001 at [redacted]w[redacted]d  1. [redacted] weeks gestation of pregnancy   2. Supervision of low-risk pregnancy, second trimester    -patient doing well;  gave guidance on 2 hour and that we will check her Hemoglobin today.    Preterm labor symptoms and general obstetric precautions including but not limited to vaginal bleeding, contractions, leaking of fluid and fetal movement were reviewed in detail with the patient. Please refer to After Visit Summary for other counseling recommendations.   Return in about 2 weeks (around 03/18/2020), or LROB with KK.  Future Appointments  Date Time Provider Department Center  03/26/2020  8:15 AM Marylene Land, CNM [facility]    [address] Garibaldi [place], [place]

## 2020-03-05 LAB — HIV ANTIBODY (ROUTINE TESTING W REFLEX): HIV Screen 4th Generation wRfx: NONREACTIVE

## 2020-03-05 LAB — RPR: RPR Ser Ql: NONREACTIVE

## 2020-03-05 LAB — CBC
Hematocrit: 37.4 % (ref 34.0–46.6)
Hemoglobin: 12.3 g/dL (ref 11.1–15.9)
MCH: 31.5 pg (ref 26.6–33.0)
MCHC: 32.9 g/dL (ref 31.5–35.7)
MCV: 96 fL (ref 79–97)
Platelets: 222 10*3/uL (ref 150–450)
RBC: 3.9 x10E6/uL (ref 3.77–5.28)
RDW: 13.5 % (ref 11.7–15.4)
WBC: 9 10*3/uL (ref 3.4–10.8)

## 2020-03-05 LAB — GLUCOSE TOLERANCE, 2 HOURS W/ 1HR
Glucose, 1 hour: 134 mg/dL (ref 65–179)
Glucose, 2 hour: 113 mg/dL (ref 65–152)
Glucose, Fasting: 67 mg/dL (ref 65–91)

## 2020-03-26 ENCOUNTER — Ambulatory Visit (INDEPENDENT_AMBULATORY_CARE_PROVIDER_SITE_OTHER): Payer: 59 | Admitting: Student

## 2020-03-26 ENCOUNTER — Other Ambulatory Visit: Payer: Self-pay

## 2020-03-26 VITALS — BP 124/58 | HR 80 | Wt 181.0 lb

## 2020-03-26 DIAGNOSIS — Z3A31 31 weeks gestation of pregnancy: Secondary | ICD-10-CM

## 2020-03-26 DIAGNOSIS — O368130 Decreased fetal movements, third trimester, not applicable or unspecified: Secondary | ICD-10-CM

## 2020-03-26 DIAGNOSIS — Z3492 Encounter for supervision of normal pregnancy, unspecified, second trimester: Secondary | ICD-10-CM

## 2020-03-26 NOTE — Progress Notes (Signed)
   PRENATAL VISIT NOTE  Subjective:  Lisa Reese is a 29 y.o. G2P1001 at [redacted]w[redacted]d being seen today for ongoing prenatal care.  She is currently monitored for the following issues for this low-risk pregnancy and has Low grade squamous intraepith lesion on cytologic smear cervix (lgsil); Back pain affecting pregnancy; Supervision of low-risk pregnancy, second trimester; Umbilical hernia without obstruction and without gangrene; [redacted] weeks gestation of pregnancy; Anemia affecting pregnancy; and Gastric reflux on their problem list.  Patient reports no complaints. She reports baby has been moving less and less over time. She does not want any LARC, prefers to use pills. She wants to know waht kind of methods she can use to start labor naturally rather than being induced.   Contractions: Not present. Vag. Bleeding: None.  Movement: (!) Decreased. Denies leaking of fluid.   The following portions of the patient's history were reviewed and updated as appropriate: allergies, current medications, past family history, past medical history, past social history, past surgical history and problem list.   Objective:   Vitals:   03/26/20 0833  BP: (!) 124/58  Pulse: 80  Weight: 181 lb (82.1 kg)    Fetal Status: Fetal Heart Rate (bpm): 136 Fundal Height: 32 cm Movement: (!) Decreased     General:  Alert, oriented and cooperative. Patient is in no acute distress.  Skin: Skin is warm and dry. No rash noted.   Cardiovascular: Normal heart rate noted  Respiratory: Normal respiratory effort, no problems with respiration noted  Abdomen: Soft, gravid, appropriate for gestational age.  Pain/Pressure: Present     Pelvic: Cervical exam deferred        Extremities: Normal range of motion.  Edema: None  Mental Status: Normal mood and affect. Normal behavior. Normal judgment and thought content.   Assessment and Plan:  Pregnancy: G2P1001 at [redacted]w[redacted]d  1. [redacted] weeks gestation of pregnancy   2. Supervision of  low-risk pregnancy, second trimester   3. Decreased fetal movements in third trimester, single or unspecified fetus    -NST reassuring, baseline is 135 and patient has 15x 15 accelerations.  -information given on birthing classes -will discuss natural methods of IOL later on, such as red raspberry tea leaf -discssued LARC vs. Pills, patient still wants pills.   Preterm labor symptoms and general obstetric precautions including but not limited to vaginal bleeding, contractions, leaking of fluid and fetal movement were reviewed in detail with the patient. Please refer to After Visit Summary for other counseling recommendations.   Return in about 2 weeks (around 04/09/2020), or LROB with any APP.  Future Appointments  Date Time Provider Department Center  04/13/2020  9:15 AM Sharyon Cable, CNM [facility]    [address] Garibaldi [place], [place]

## 2020-03-26 NOTE — Patient Instructions (Addendum)
CLASS - Virtual Saturday Childbirth Class (Live Instructors) Registration Open Apr 25, 2020 Saturday 9:00 AM - 4:00 PM Online  Register online at : [email or web address]

## 2020-04-13 ENCOUNTER — Other Ambulatory Visit: Payer: Self-pay

## 2020-04-13 ENCOUNTER — Encounter: Payer: Self-pay | Admitting: Certified Nurse Midwife

## 2020-04-13 ENCOUNTER — Ambulatory Visit (INDEPENDENT_AMBULATORY_CARE_PROVIDER_SITE_OTHER): Payer: 59 | Admitting: Certified Nurse Midwife

## 2020-04-13 VITALS — BP 117/58 | HR 84 | Wt 182.5 lb

## 2020-04-13 DIAGNOSIS — Z3492 Encounter for supervision of normal pregnancy, unspecified, second trimester: Secondary | ICD-10-CM

## 2020-04-13 DIAGNOSIS — Z3A34 34 weeks gestation of pregnancy: Secondary | ICD-10-CM

## 2020-04-13 DIAGNOSIS — O99019 Anemia complicating pregnancy, unspecified trimester: Secondary | ICD-10-CM

## 2020-04-13 NOTE — Patient Instructions (Addendum)
Group B Streptococcus Test During Pregnancy Why am I having this test? Routine testing, also called screening, for group B streptococcus (GBS) is recommended for all pregnant women between the 36th and 37th week of pregnancy. GBS is a type of bacteria that can be passed from mother to baby during childbirth. Screening will help guide whether or not you will need treatment during labor and delivery to prevent complications such as:  An infection in your uterus during labor.  An infection in your uterus after delivery.  A serious infection in your baby after delivery, such as pneumonia, meningitis, or sepsis. GBS screening is not often done before 36 weeks of pregnancy unless you go into labor prematurely. What happens if I have group B streptococcus? If testing shows that you have GBS, your health care provider will recommend treatment with IV antibiotics during labor and delivery. This treatment significantly decreases the risk of complications for you and your baby. If you have a planned C-section and you have GBS, you may not need to be treated with antibiotics because GBS is usually passed to babies after labor starts and your water breaks. If you are in labor or your water breaks before your C-section, it is possible for GBS to get into your uterus and be passed to your baby, so you might need treatment. Is there a chance I may not need to be tested? You may not need to be tested for GBS if:  You have a urine test that shows GBS before 36 to 37 weeks.  You had a baby with GBS infection after a previous delivery. In these cases, you will automatically be treated for GBS during labor and delivery. What is being tested? This test is done to check if you have group B streptococcus in your vagina or rectum. What kind of sample is taken? To collect samples for this test, your health care provider will swab your vagina and rectum with a cotton swab. The sample is then sent to the lab to see if  GBS is present. What happens during the test?   You will remove your clothing from the waist down.  You will lie down on an exam table in the same position as you would for a pelvic exam.  Your health care provider will swab your vagina and rectum to collect samples for a culture test.  You will be able to go home after the test and do all your usual activities. How are the results reported? The test results are reported as positive or negative. What do the results mean?  A positive test means you are at risk for passing GBS to your baby during labor and delivery. Your health care provider will recommend that you are treated with an IV antibiotic during labor and delivery.  A negative test means you are at very low risk of passing GBS to your baby. There is still a low risk of passing GBS to your baby because sometimes test results may report that you do not have a condition when you do (false-negative result) or there is a chance that you may become infected with GBS after the test is done. You most likely will not need to be treated with an antibiotic during labor and delivery. Talk with your health care provider about what your results mean. Questions to ask your health care provider Ask your health care provider, or the department that is doing the test:  When will my results be ready?  How will I   get my results?  What are my treatment options? Summary  Routine testing (screening) for group B streptococcus (GBS) is recommended for all pregnant women between the 36th and 37th week of pregnancy.  GBS is a type of bacteria that can be passed from mother to baby during childbirth.  If testing shows that you have GBS, your health care provider will recommend that you are treated with IV antibiotics during labor and delivery. This treatment almost always prevents infection in newborns. This information is not intended to replace advice given to you by your health care provider. Make  sure you discuss any questions you have with your health care provider. Document Revised: 07/26/2018 Document Reviewed: 05/02/2018 Elsevier Patient Education  2020 Elsevier Inc.  Cervical Ripening (to get your cervix ready for labor) : May try one or all:  Red Raspberry Leaf capsules:  two 300mg  or 400mg  tablets with each meal, 2-3 times a day  Potential Side Effects Of Raspberry Leaf:  Most women do not experience any side effects from drinking raspberry leaf tea. However, nausea and loose stools are possible     Evening Primrose Oil capsules: may take 1 to 3 capsules daily. May also prick one to release the oil and insert it into your vagina at night.  Some of the potential side effects:  Upset stomach  Loose stools or diarrhea  Headaches  Nausea   4 Dates a day (may taste better if warmed in microwave until soft). Found where raisins are in the grocery store

## 2020-04-13 NOTE — Progress Notes (Signed)
   PRENATAL VISIT NOTE  Subjective:  Lisa Reese is a 29 y.o. G2P1001 at [redacted]w[redacted]d being seen today for ongoing prenatal care.  She is currently monitored for the following issues for this low-risk pregnancy and has Low grade squamous intraepith lesion on cytologic smear cervix (lgsil); Back pain affecting pregnancy; Supervision of low-risk pregnancy, second trimester; Umbilical hernia without obstruction and without gangrene; [redacted] weeks gestation of pregnancy; Anemia affecting pregnancy; and Gastric reflux on their problem list.  Patient reports no complaints.  Contractions: Irritability. Vag. Bleeding: None.  Movement: Present. Denies leaking of fluid.   The following portions of the patient's history were reviewed and updated as appropriate: allergies, current medications, past family history, past medical history, past social history, past surgical history and problem list.   Objective:   Vitals:   04/13/20 0930  BP: (!) 117/58  Pulse: 84  Weight: 182 lb 8 oz (82.8 kg)    Fetal Status: Fetal Heart Rate (bpm): 157 Fundal Height: 33 cm Movement: Present  Presentation: Vertex  General:  Alert, oriented and cooperative. Patient is in no acute distress.  Skin: Skin is warm and dry. No rash noted.   Cardiovascular: Normal heart rate noted  Respiratory: Normal respiratory effort, no problems with respiration noted  Abdomen: Soft, gravid, appropriate for gestational age.  Pain/Pressure: Present     Pelvic: Cervical exam deferred        Extremities: Normal range of motion.  Edema: None  Mental Status: Normal mood and affect. Normal behavior. Normal judgment and thought content.   Assessment and Plan:  Pregnancy: G2P1001 at [redacted]w[redacted]d 1. Supervision of low-risk pregnancy, second trimester - Patient doing well, no complaints  - Routine prenatal care - Anticipatory guidance on upcoming appointments with next GBS screening, discussed recommendations of antibiotics during pregnancy if positive,  patient verbalizes understanding   2. Anemia affecting pregnancy, antepartum - continue iron supplementation   3. [redacted] weeks gestation of pregnancy - Educated and discussed cervical ripening that can start around 35/36 weeks  - vertex presentation confirmed by bedside US   Preterm labor symptoms and general obstetric precautions including but not limited to vaginal bleeding, contractions, leaking of fluid and fetal movement were reviewed in detail with the patient. Please refer to After Visit Summary for other counseling recommendations.   Return in about 2 weeks (around 04/27/2020) for LROB, GBS, in person.  Future Appointments  Date Time Provider Department Center  04/28/2020 10:35 AM Calvert Cantor, CNM [facility]    Sharyon Cable, CNM

## 2020-04-18 NOTE — L&D Delivery Note (Addendum)
Delivery Note At 4:19 PM a viable and healthy female was delivered via Vaginal, Spontaneous (Presentation: Left Occiput Anterior).  APGAR: 9, 9; weight pending.   Placenta status: Spontaneous, Intact.  Cord: 3 vessels with the following complications: None.   Anesthesia: Epidural Episiotomy: No Lacerations: 2nd degree perineal Suture Repair: 2.0 vicryl rapide Est. Blood Loss (mL): 200   Mom to postpartum.  Baby to Couplet care / Skin to Skin.  EMILY M GREEN 05/26/2020, 4:54 PM  Midwife attestation I agree with the documentation in the resident's note.   Donette Larry, CNM 5:02 PM

## 2020-04-21 ENCOUNTER — Other Ambulatory Visit: Payer: Self-pay | Admitting: Student

## 2020-04-28 ENCOUNTER — Other Ambulatory Visit (HOSPITAL_COMMUNITY)
Admission: RE | Admit: 2020-04-28 | Discharge: 2020-04-28 | Disposition: A | Discharge status: Home / Self Care | Payer: Medicaid Other | Source: Ambulatory Visit | Attending: Advanced Practice Midwife | Admitting: Advanced Practice Midwife

## 2020-04-28 ENCOUNTER — Other Ambulatory Visit: Payer: Self-pay

## 2020-04-28 ENCOUNTER — Ambulatory Visit (INDEPENDENT_AMBULATORY_CARE_PROVIDER_SITE_OTHER): Payer: Medicaid Other | Admitting: Advanced Practice Midwife

## 2020-04-28 VITALS — BP 116/78 | HR 84 | Wt 183.6 lb

## 2020-04-28 DIAGNOSIS — Z3492 Encounter for supervision of normal pregnancy, unspecified, second trimester: Secondary | ICD-10-CM | POA: Diagnosis not present

## 2020-04-28 DIAGNOSIS — Z3A36 36 weeks gestation of pregnancy: Secondary | ICD-10-CM

## 2020-04-28 NOTE — Patient Instructions (Addendum)
Group B Streptococcus Test During Pregnancy Why am I having this test? Routine testing, also called screening, for group B streptococcus (GBS) is recommended for all pregnant women between the 36th and 37th week of pregnancy. GBS is a type of bacteria that can be passed from mother to baby during childbirth. Screening will help guide whether or not you will need treatment during labor and delivery to prevent complications such as:  An infection in your uterus during labor.  An infection in your uterus after delivery.  A serious infection in your baby after delivery, such as pneumonia, meningitis, or sepsis. GBS screening is not often done before 36 weeks of pregnancy unless you go into labor prematurely. What happens if I have group B streptococcus? If testing shows that you have GBS, your health care provider will recommend treatment with IV antibiotics during labor and delivery. This treatment significantly decreases the risk of complications for you and your baby. If you have a planned C-section and you have GBS, you may not need to be treated with antibiotics because GBS is usually passed to babies after labor starts and your water breaks. If you are in labor or your water breaks before your C-section, it is possible for GBS to get into your uterus and be passed to your baby, so you might need treatment. Is there a chance I may not need to be tested? You may not need to be tested for GBS if:  You have a urine test that shows GBS before 36 to 37 weeks.  You had a baby with GBS infection after a previous delivery. In these cases, you will automatically be treated for GBS during labor and delivery. What is being tested? This test is done to check if you have group B streptococcus in your vagina or rectum. What kind of sample is taken? To collect samples for this test, your health care provider will swab your vagina and rectum with a cotton swab. The sample is then sent to the lab to see if  GBS is present. What happens during the test?  You will remove your clothing from the waist down.  You will lie down on an exam table in the same position as you would for a pelvic exam.  Your health care provider will swab your vagina and rectum to collect samples for a culture test.  You will be able to go home after the test and do all your usual activities.   How are the results reported? The test results are reported as positive or negative. What do the results mean?  A positive test means you are at risk for passing GBS to your baby during labor and delivery. Your health care provider will recommend that you are treated with an IV antibiotic during labor and delivery.  A negative test means you are at very low risk of passing GBS to your baby. There is still a low risk of passing GBS to your baby because sometimes test results may report that you do not have a condition when you do (false-negative result) or there is a chance that you may become infected with GBS after the test is done. You most likely will not need to be treated with an antibiotic during labor and delivery. Talk with your health care provider about what your results mean. Questions to ask your health care provider Ask your health care provider, or the department that is doing the test:  When will my results be ready?  How will   I get my results?  What are my treatment options? Summary  Routine testing (screening) for group B streptococcus (GBS) is recommended for all pregnant women between the 36th and 37th week of pregnancy.  GBS is a type of bacteria that can be passed from mother to baby during childbirth.  If testing shows that you have GBS, your health care provider will recommend that you are treated with IV antibiotics during labor and delivery. This treatment almost always prevents infection in newborns. This information is not intended to replace advice given to you by your health care provider. Make  sure you discuss any questions you have with your health care provider. Document Revised: 02/04/2020 Document Reviewed: 05/02/2018 Elsevier Patient Education  2021 Elsevier Inc.   Cervical Ripening (to get your cervix ready for labor) : May try one or all:  Red Raspberry Leaf capsules:  two 300mg  or 400mg  tablets with each meal, 2-3 times a day  Potential Side Effects Of Raspberry Leaf:  Most women do not experience any side effects from drinking raspberry leaf tea. However, nausea and loose stools are possible     Evening Primrose Oil capsules: may take 1 to 3 capsules daily. May also prick one to release the oil and insert it into your vagina at night.  Some of the potential side effects:  Upset stomach  Loose stools or diarrhea  Headaches  Nausea   4 Dates a day (may taste better if warmed in microwave until soft). Found where raisins are in the grocery store

## 2020-04-28 NOTE — Progress Notes (Addendum)
    Subjective:  Lisa Reese is a 30 y.o. G2P1001 at [redacted]w[redacted]d being seen today for ongoing prenatal care.  She is currently monitored for the following issues for this low-risk pregnancy and reports that she is doing well. She notes some abdominal discomfort/cramping but no other symptoms. She endorses a lot of fetal movement and denies any vaginal bleeding. Her history is significant for low-grade squamous intraepithelial lesion: CIN-1/HPV, back pain affecting pregnancy, anemia affecting pregnancy, gastric reflux, and umbilical hernia without obstruction or gangrene.   The following portions of the patient's history were reviewed and updated as appropriate: allergies, current medications, past family history, past medical history, past social history, past surgical history and problem list.   OB History     Gravida  2   Para  1   Term  1   Preterm  0   AB  0   Living  1      SAB  0   IAB  0   Ectopic  0   Multiple  0   Live Births  1          ROS:  General: endorses feeling well  GI: affirms abdominal discomfort and cramping  Pelvic: affirms feeling of heaviness  MSK: endorses she is unable to lay back in a comfortable position   Objective:   Vitals:   04/28/20 1114  BP: 116/78  Pulse: 84  Weight: 183 lb 9.6 oz (83.3 kg)   General:  Alert, oriented and cooperative. Patient is in no acute distress  Skin: Skin is warm and dry  Cardiovascular: Normal heart rate noted  Respiratory: Normal respiratory effort, no problems with respiration noted  Abdomen: Soft, gravid, appropriate for gestational age. Fundus measured 37cm. Fetal movement evident from observation and palpation.   Pelvic:  Cervical exam performed in the presence of a chaperone. Cervix not dilated  Extremities: Normal range of motion  Mental Status: Normal mood and affect. Normal behavior. Normal judgment and thought content    Assessment and Plan:  Pregnancy: G2P1001 at [redacted]w[redacted]d  1.  Supervision of low-risk pregnancy, third trimester  Doing well and receiving routine prenatal care. Given anticipatory guidance on labor symptoms and precautions including but not limited to vaginal bleeding, contractions, leaking of fluid and fetal movement. Instructed to report to the MAU if symptoms of labor begin.   - Culture, beta strep (group b only) - GC/Chlamydia probe amp ()not at [facility]  Please refer to After Visit Summary for other counseling recommendations.  Return in about 1 week (around 05/05/2020).   Athena Masse, Student-PA  Attestation of Supervision of Student:  I confirm that I have verified the information documented in the physician assistant student's note and that I have also personally reperformed the history, physical exam and all medical decision making activities.  I have verified that all services and findings are accurately documented in this student's note; and I agree with management and plan as outlined in the documentation. I have also made any necessary editorial changes.  - Closed cervix - Restated previous guidance for cervical ripening - Confirmed patient knows location of MAU for labor triage PRN  Calvert Cantor, CNM [facility] [employer], [facility] 04/28/2020 2:20 PM

## 2020-04-29 ENCOUNTER — Other Ambulatory Visit: Payer: Self-pay | Admitting: Student

## 2020-04-29 ENCOUNTER — Other Ambulatory Visit: Payer: Self-pay

## 2020-04-30 LAB — GC/CHLAMYDIA PROBE AMP (~~LOC~~) NOT AT ARMC
Chlamydia: NEGATIVE
Comment: NEGATIVE
Comment: NORMAL
Neisseria Gonorrhea: NEGATIVE

## 2020-05-02 LAB — CULTURE, BETA STREP (GROUP B ONLY): Strep Gp B Culture: NEGATIVE

## 2020-05-06 ENCOUNTER — Telehealth (INDEPENDENT_AMBULATORY_CARE_PROVIDER_SITE_OTHER): Payer: Medicaid Other | Admitting: Student

## 2020-05-06 VITALS — BP 116/47 | HR 88

## 2020-05-06 DIAGNOSIS — R1030 Lower abdominal pain, unspecified: Secondary | ICD-10-CM

## 2020-05-06 DIAGNOSIS — M549 Dorsalgia, unspecified: Secondary | ICD-10-CM

## 2020-05-06 DIAGNOSIS — O2243 Hemorrhoids in pregnancy, third trimester: Secondary | ICD-10-CM

## 2020-05-06 DIAGNOSIS — Z3A37 37 weeks gestation of pregnancy: Secondary | ICD-10-CM

## 2020-05-06 DIAGNOSIS — O99891 Other specified diseases and conditions complicating pregnancy: Secondary | ICD-10-CM

## 2020-05-06 DIAGNOSIS — Z3492 Encounter for supervision of normal pregnancy, unspecified, second trimester: Secondary | ICD-10-CM

## 2020-05-06 DIAGNOSIS — O26893 Other specified pregnancy related conditions, third trimester: Secondary | ICD-10-CM

## 2020-05-06 MED ORDER — HYDROCORTISONE ACETATE 1 % EX OINT: 1.0000 "application " | TOPICAL_OINTMENT | Freq: Two times a day (BID) | CUTANEOUS | 0 refills | Status: DC

## 2020-05-06 MED ORDER — HYDROCORTISONE ACETATE 25 MG RE SUPP: 25.0000 mg | Freq: Two times a day (BID) | RECTAL | 0 refills | Status: DC

## 2020-05-06 NOTE — Progress Notes (Signed)
Patient ID: Lisa Reese, female   DOB: 1990-10-30, 30 y.o.   MRN: [id number] I connected with Lisa Reese 05/06/20 at 10:35 AM EST by: MyChart video and verified that I am speaking with the correct person using two identifiers.  Patient is located at home and provider is located at [facility].     The purpose of this virtual visit is to provide medical care while limiting exposure to the novel coronavirus. I discussed the limitations, risks, security and privacy concerns of performing an evaluation and management service by MyChart video and the availability of in person appointments. I also discussed with the patient that there may be a patient responsible charge related to this service. By engaging in this virtual visit, you consent to the provision of healthcare.  Additionally, you authorize for your insurance to be billed for the services provided during this visit.  The patient expressed understanding and agreed to proceed.  The following staff members participated in the virtual visit:  Maxwell Marion    PRENATAL VISIT NOTE  Subjective:  Lisa Reese is a 30 y.o. G2P1001 at [redacted]w[redacted]d  for phone visit for ongoing prenatal care.  She is currently monitored for the following issues for this low-risk pregnancy and has Low grade squamous intraepith lesion on cytologic smear cervix (lgsil); Back pain affecting pregnancy; Supervision of low-risk pregnancy, second trimester; Umbilical hernia without obstruction and without gangrene; [redacted] weeks gestation of pregnancy; Anemia affecting pregnancy; and Gastric reflux on their problem list.  Patient reports some tightness in her lower belly. SHe also reports some brownish discharge and an odor. . The odor doesn't smell like blood, but it is "different" for her. It has been going on for three weeks. She did not mention it before bc it was not concerning to her.  SHe denies active bleeding or that her water has broken. She reports that she has hemorroids  too, that are getting more and more painful. She has questions about how to use evening primrose oil.    Contractions: Not present. Vag. Bleeding: None.  Movement: Present. Denies leaking of fluid.   The following portions of the patient's history were reviewed and updated as appropriate: allergies, current medications, past family history, past medical history, past social history, past surgical history and problem list.   Objective:   Vitals:   05/06/20 1036  BP: (!) 116/47  Pulse: 88   Self-Obtained  Fetal Status:     Movement: Present     Assessment and Plan:  Pregnancy: G2P1001 at [redacted]w[redacted]d 1. Supervision of low-risk pregnancy, second trimester -Reviewed that the discharge could be from hemorroids; was recently tested for [facility] CT and was negative. Does not sound like bleeding or LOF; continue to monitor closely. -Encouraged patient to come to MAU if vaginal discharge changes or worsens; we would want ot make sure that she does not have any bleeding or LOF.  -WIll give cream for hemorrhoids as well as anusol; patient not sure insurance will cover it but she will be OTC if necessary.  -Discussed safe use of evening primrose oil  Preterm labor symptoms and general obstetric precautions including but not limited to vaginal bleeding, contractions, leaking of fluid and fetal movement were reviewed in detail with the patient.  Return in about 10 days (around 05/16/2020), or for LROB in person with APP.  No future appointments.   Time spent on virtual visit: 20 minutes  Marylene Land, CNM

## 2020-05-06 NOTE — Progress Notes (Signed)
I connected with  Lisa Reese on 05/06/20 at 1034 by telephone and verified that I am speaking with the correct person using two identifiers.   I discussed the limitations, risks, security and privacy concerns of performing an evaluation and management service by telephone and the availability of in person appointments. I also discussed with the patient that there may be a patient responsible charge related to this service. The patient expressed understanding and agreed to proceed.  Reports brown vaginal discharge with foul odor.   Marjo Bicker, RN  05/06/2020  10:34 AM

## 2020-05-18 ENCOUNTER — Telehealth (INDEPENDENT_AMBULATORY_CARE_PROVIDER_SITE_OTHER): Payer: Medicaid Other | Admitting: Obstetrics & Gynecology

## 2020-05-18 ENCOUNTER — Encounter: Payer: Self-pay | Admitting: Obstetrics & Gynecology

## 2020-05-18 VITALS — BP 116/59 | HR 94

## 2020-05-18 DIAGNOSIS — O99891 Other specified diseases and conditions complicating pregnancy: Secondary | ICD-10-CM

## 2020-05-18 DIAGNOSIS — O2243 Hemorrhoids in pregnancy, third trimester: Secondary | ICD-10-CM

## 2020-05-18 DIAGNOSIS — Z3A39 39 weeks gestation of pregnancy: Secondary | ICD-10-CM

## 2020-05-18 DIAGNOSIS — O26893 Other specified pregnancy related conditions, third trimester: Secondary | ICD-10-CM

## 2020-05-18 DIAGNOSIS — M549 Dorsalgia, unspecified: Secondary | ICD-10-CM

## 2020-05-18 DIAGNOSIS — Z3492 Encounter for supervision of normal pregnancy, unspecified, second trimester: Secondary | ICD-10-CM

## 2020-05-18 DIAGNOSIS — O210 Mild hyperemesis gravidarum: Secondary | ICD-10-CM

## 2020-05-18 NOTE — Progress Notes (Signed)
Called pt early for appt at 1341; pt agreeable to start visit early, will log into MyChart.   I connected with  Lisa Reese on 05/18/20 at 1343 by MyChart and verified that I am speaking with the correct person using two identifiers.   I discussed the limitations, risks, security and privacy concerns of performing an evaluation and management service by telephone and the availability of in person appointments. I also discussed with the patient that there may be a patient responsible charge related to this service. The patient expressed understanding and agreed to proceed.  Marjo Bicker, RN 05/18/2020  1:42 PM

## 2020-05-18 NOTE — Progress Notes (Signed)
I connected with Lisa Reese 05/18/20 at  2:15 PM EST by: MyChart video and verified that I am speaking with the correct person using two identifiers.  Patient is located at home and provider is located at [facility].     The purpose of this virtual visit is to provide medical care while limiting exposure to the novel coronavirus. I discussed the limitations, risks, security and privacy concerns of performing an evaluation and management service by MyChart video and the availability of in person appointments. I also discussed with the patient that there may be a patient responsible charge related to this service. By engaging in this virtual visit, you consent to the provision of healthcare.  Additionally, you authorize for your insurance to be billed for the services provided during this visit.  The patient expressed understanding and agreed to proceed.  The following staff members participated in the virtual visit:  Maxwell Marion, RN    PRENATAL VISIT NOTE  Subjective:  Lisa Reese is a 30 y.o. G2P1001 at [redacted]w[redacted]d  for phone visit for ongoing prenatal care.  She is currently monitored for the following issues for this low-risk pregnancy and has Low grade squamous intraepith lesion on cytologic smear cervix (lgsil); Back pain affecting pregnancy; Supervision of low-risk pregnancy, second trimester; Umbilical hernia without obstruction and without gangrene; [redacted] weeks gestation of pregnancy; Anemia affecting pregnancy; and Gastric reflux on their problem list.  Patient reports some pelvic pressure.  Contractions: Irritability. Vag. Bleeding: None.  Movement: Present. Denies leaking of fluid.   The following portions of the patient's history were reviewed and updated as appropriate: allergies, current medications, past family history, past medical history, past social history, past surgical history and problem list.   Objective:   Vitals:   05/18/20 1352  BP: (!) 116/59  Pulse: 94    Self-Obtained  Fetal Status:     Movement: Present     Assessment and Plan:  Pregnancy: G2P1001 at [redacted]w[redacted]d 1. [redacted] weeks gestation of pregnancy - on PNV - Recheck 1 week with BPP.  Induction at 41 weeks discussed.  Pt prefers to go into labor on her own if possible  2. Supervision of low-risk pregnancy, second trimester  3. Back pain affecting pregnancy in third trimester  4. Hemorrhoids during pregnancy in third trimester - checked [place] Medicaid list and hydrocortisone cream is on the last.  Pt thinks she needs to update pharmacy with her insurance care.  Will call if needs new rx.  Term labor symptoms and general obstetric precautions including but not limited to vaginal bleeding, contractions, leaking of fluid and fetal movement were reviewed in detail with the patient.  No follow-ups on file.  Future Appointments  Date Time Provider Department Center  05/18/2020  2:15 PM Jerene Bears, MD [facility]    Time spent on virtual visit: 12 minutes  Jerene Bears, MD

## 2020-05-25 ENCOUNTER — Ambulatory Visit: Payer: Self-pay

## 2020-05-25 ENCOUNTER — Encounter (HOSPITAL_COMMUNITY): Payer: Self-pay | Admitting: Obstetrics & Gynecology

## 2020-05-25 ENCOUNTER — Other Ambulatory Visit: Payer: Self-pay

## 2020-05-25 ENCOUNTER — Encounter (HOSPITAL_COMMUNITY): Payer: Self-pay | Admitting: *Deleted

## 2020-05-25 ENCOUNTER — Inpatient Hospital Stay (HOSPITAL_COMMUNITY)
Admission: AD | Admit: 2020-05-25 | Discharge: 2020-05-28 | DRG: 807 | Disposition: A | Discharge status: Home / Self Care | Payer: Medicaid Other | Attending: Obstetrics & Gynecology | Admitting: Obstetrics & Gynecology

## 2020-05-25 ENCOUNTER — Ambulatory Visit (INDEPENDENT_AMBULATORY_CARE_PROVIDER_SITE_OTHER): Payer: Medicaid Other | Admitting: Obstetrics & Gynecology

## 2020-05-25 ENCOUNTER — Telehealth (HOSPITAL_COMMUNITY): Payer: Self-pay | Admitting: *Deleted

## 2020-05-25 ENCOUNTER — Ambulatory Visit (INDEPENDENT_AMBULATORY_CARE_PROVIDER_SITE_OTHER): Payer: Medicaid Other | Admitting: *Deleted

## 2020-05-25 ENCOUNTER — Other Ambulatory Visit (HOSPITAL_COMMUNITY): Payer: Self-pay | Admitting: Advanced Practice Midwife

## 2020-05-25 VITALS — BP 121/66 | HR 94 | Wt 197.6 lb

## 2020-05-25 DIAGNOSIS — Z3A40 40 weeks gestation of pregnancy: Secondary | ICD-10-CM | POA: Diagnosis not present

## 2020-05-25 DIAGNOSIS — Z3492 Encounter for supervision of normal pregnancy, unspecified, second trimester: Secondary | ICD-10-CM

## 2020-05-25 DIAGNOSIS — O4292 Full-term premature rupture of membranes, unspecified as to length of time between rupture and onset of labor: Secondary | ICD-10-CM | POA: Diagnosis not present

## 2020-05-25 DIAGNOSIS — Z20822 Contact with and (suspected) exposure to covid-19: Secondary | ICD-10-CM | POA: Diagnosis present

## 2020-05-25 DIAGNOSIS — O26893 Other specified pregnancy related conditions, third trimester: Secondary | ICD-10-CM | POA: Diagnosis present

## 2020-05-25 DIAGNOSIS — O480 Post-term pregnancy: Secondary | ICD-10-CM | POA: Diagnosis present

## 2020-05-25 DIAGNOSIS — Z3490 Encounter for supervision of normal pregnancy, unspecified, unspecified trimester: Secondary | ICD-10-CM

## 2020-05-25 LAB — CBC
HCT: 38.7 % (ref 36.0–46.0)
Hemoglobin: 12.9 g/dL (ref 12.0–15.0)
MCH: 31.2 pg (ref 26.0–34.0)
MCHC: 33.3 g/dL (ref 30.0–36.0)
MCV: 93.7 fL (ref 80.0–100.0)
Platelets: 198 10*3/uL (ref 150–400)
RBC: 4.13 MIL/uL (ref 3.87–5.11)
RDW: 14.3 % (ref 11.5–15.5)
WBC: 7.5 10*3/uL (ref 4.0–10.5)
nRBC: 0 % (ref 0.0–0.2)

## 2020-05-25 LAB — POCT FERN TEST: POCT Fern Test: NEGATIVE

## 2020-05-25 MED ORDER — OXYTOCIN-SODIUM CHLORIDE 30-0.9 UT/500ML-% IV SOLN
2.5000 [IU]/h | INTRAVENOUS | Status: DC
  Administered 2020-05-26: 2.5 [IU]/h via INTRAVENOUS

## 2020-05-25 MED ORDER — LIDOCAINE HCL (PF) 1 % IJ SOLN: 30.0000 mL | INTRAMUSCULAR | Status: DC | PRN

## 2020-05-25 MED ORDER — FLEET ENEMA 7-19 GM/118ML RE ENEM: 1.0000 | ENEMA | RECTAL | Status: DC | PRN

## 2020-05-25 MED ORDER — SOD CITRATE-CITRIC ACID 500-334 MG/5ML PO SOLN: 30.0000 mL | ORAL | Status: DC | PRN

## 2020-05-25 MED ORDER — LACTATED RINGERS IV SOLN: INTRAVENOUS | Status: DC

## 2020-05-25 MED ORDER — OXYCODONE-ACETAMINOPHEN 5-325 MG PO TABS: 2.0000 | ORAL_TABLET | ORAL | Status: DC | PRN

## 2020-05-25 MED ORDER — ONDANSETRON HCL 4 MG/2ML IJ SOLN: 4.0000 mg | Freq: Four times a day (QID) | INTRAMUSCULAR | Status: DC | PRN

## 2020-05-25 MED ORDER — TERBUTALINE SULFATE 1 MG/ML IJ SOLN: 0.2500 mg | Freq: Once | INTRAMUSCULAR | Status: DC | PRN

## 2020-05-25 MED ORDER — LACTATED RINGERS IV SOLN: 500.0000 mL | INTRAVENOUS | Status: DC | PRN

## 2020-05-25 MED ORDER — ACETAMINOPHEN 325 MG PO TABS: 650.0000 mg | ORAL_TABLET | ORAL | Status: DC | PRN

## 2020-05-25 MED ORDER — OXYCODONE-ACETAMINOPHEN 5-325 MG PO TABS
1.0000 | ORAL_TABLET | ORAL | Status: DC | PRN
Start: 2020-05-25 — End: 2020-05-26

## 2020-05-25 MED ORDER — OXYTOCIN BOLUS FROM INFUSION
333.0000 mL | Freq: Once | INTRAVENOUS | Status: AC
  Administered 2020-05-26: 333 mL via INTRAVENOUS

## 2020-05-25 MED ORDER — FENTANYL CITRATE (PF) 100 MCG/2ML IJ SOLN
50.0000 ug | INTRAMUSCULAR | Status: DC | PRN
  Administered 2020-05-26: 100 ug via INTRAVENOUS
  Filled 2020-05-25: qty 2

## 2020-05-25 NOTE — H&P (Addendum)
OBSTETRIC ADMISSION HISTORY AND PHYSICAL  Lisa Reese is a 30 y.o. female G2P1001 with IUP at 66w3dby LMP presenting for SOL. She reports +FMs, leaking of fluid, no VB, no blurry vision, headaches or peripheral edema, and RUQ pain.  She plans on breast feeding. She requests POP for birth control. [facility]  Dating: By LMP --->  Estimated Date of Delivery: 05/22/20  Sono:    _0 , CWD, normal anatomy, cephalic presentation,  4409W 78% EFW   Prenatal History/Complications:  Low grade squamous intraepith lesion on cytologic smear cervix (lgsil)  Past Medical History: Past Medical History:  Diagnosis Date   Hyperemesis gravidarum    Vaginal Pap smear, abnormal     Past Surgical History: Past Surgical History:  Procedure Laterality Date   NO PAST SURGERIES      Obstetrical History: OB History     Gravida  2   Para  1   Term  1   Preterm  0   AB  0   Living  1      SAB  0   IAB  0   Ectopic  0   Multiple  0   Live Births  1           Social History Social History   Socioeconomic History   Marital status: Married    Spouse name: Not on file   Number of children: Not on file   Years of education: Not on file   Highest education level: Not on file  Occupational History   Not on file  Tobacco Use   Smoking status: Never Smoker   Smokeless tobacco: Never Used  Vaping Use   Vaping Use: Never used  Substance and Sexual Activity   Alcohol use: No   Drug use: No   Sexual activity: Yes    Birth control/protection: None  Other Topics Concern   Not on file  Social History Narrative   Not on file   Social Determinants of Health   Financial Resource Strain: Not on file  Food Insecurity: No Food Insecurity   Worried About Running Out of Food in the Last Year: Never true   Ran Out of Food in the Last Year: Never true  Transportation Needs: No Transportation Needs   Lack of Transportation (Medical): No   Lack  of Transportation (Non-Medical): No  Physical Activity: Not on file  Stress: Not on file  Social Connections: Not on file    Family History: Family History  Problem Relation Age of Onset   Diabetes Father     Allergies: No Known Allergies  Pt denies allergies to latex, iodine, or shellfish.  Medications Prior to Admission  Medication Sig Dispense Refill Last Dose   famotidine (PEPCID) 20 MG tablet Take 1 tablet by mouth twice daily 30 tablet 0 05/25/2020 at Unknown time   Prenatal Vit-Fe Fumarate-FA (MULTIVITAMIN-PRENATAL) 27-0.8 MG TABS tablet Take 1 tablet by mouth daily.   05/25/2020 at Unknown time   Blood Pressure KIT 1 Units by Does not apply route once a week. To monitor BP weekly for low risk pregnancy (z34.90) (Patient not taking: Reported on 05/25/2020) 1 kit 0    hydrocortisone (ANUSOL-HC) 25 MG suppository Place 1 suppository (25 mg total) rectally 2 (two) times daily. (Patient not taking: No sig reported) 12 suppository 0    Hydrocortisone Acetate 1 % OINT Apply 1 application topically in the morning and at bedtime. (Patient not taking: Reported on  05/25/2020) 28.4 g 0      Review of Systems   All systems reviewed and negative except as stated in HPI  Blood pressure 122/77, pulse 82, temperature 98.8 F (37.1 C), temperature source Oral, resp. rate 18, height _0  (1.626 m), weight 89.4 kg, last menstrual period 08/16/2019, SpO2 100 %, unknown if currently breastfeeding. General appearance: alert, cooperative and no distress Lungs: normal WOB Heart: regular rate Extremities: no sign of DVT Presentation: cephalic Fetal monitoringBaseline: 125 bpm, Variability: Good {> 6 bpm), Accelerations: Reactive and Decelerations: Absent Uterine activityFrequency: Irregular  Dilation: 4.5 Effacement (%): 60 Exam by:: Lisa Reese CNM   Prenatal labs: ABO, Rh: --/--/PENDING (02/07 2251) Antibody: PENDING (02/07 2251) Rubella: 9.74 (08/19 1023) RPR: Non Reactive (11/17 0827)   HBsAg: Negative (08/19 1023)  HIV: Non Reactive (11/17 0827)  GBS: Negative/-- (01/11 1151)  2 hr Glucola: 113 Genetic screening:  normal Anatomy US: normal   Prenatal Transfer Tool  Maternal Diabetes: No Genetic Screening: Normal Maternal Ultrasounds/Referrals: Normal Fetal Ultrasounds or other Referrals:  None Maternal Substance Abuse:  No Significant Maternal Medications:  None Significant Maternal Lab Results: None  Results for orders placed or performed during the hospital encounter of 05/25/20 (from the past 24 hour(s))  CBC   Collection Time: 05/25/20 10:51 PM  Result Value Ref Range   WBC 7.5 4.0 - 10.5 K/uL   RBC 4.13 3.87 - 5.11 MIL/uL   Hemoglobin 12.9 12.0 - 15.0 g/dL   HCT 38.7 36.0 - 46.0 %   MCV 93.7 80.0 - 100.0 fL   MCH 31.2 26.0 - 34.0 pg   MCHC 33.3 30.0 - 36.0 g/dL   RDW 14.3 11.5 - 15.5 %   Platelets 198 150 - 400 K/uL   nRBC 0.0 0.0 - 0.2 %  Type and screen   Collection Time: 05/25/20 10:51 PM  Result Value Ref Range   ABO/RH(D) PENDING    Antibody Screen PENDING    Sample Expiration      05/28/2020,2359 Performed at [facility], [place] [address] [place], [place] [address]   Lisa Reese Test   Collection Time: 05/25/20 11:20 PM  Result Value Ref Range   POCT Fern Test Negative = intact amniotic membranes     Patient Active Problem List   Diagnosis Date Noted   Post-dates pregnancy 05/25/2020   Anemia affecting pregnancy 01/30/2020   Gastric reflux 67/59/1638   Umbilical hernia without obstruction and without gangrene 01/02/2020   Supervision of low-risk pregnancy, second trimester 12/05/2019   Back pain affecting pregnancy 10/04/2019   Low grade squamous intraepith lesion on cytologic smear cervix (lgsil) 10/12/2016    Assessment/Plan:  Lisa Reese is a 30 y.o. G2P1001 at 84w3dhere for SOL   #Labor: SVE 4.5cm in MAU. Bulging bag. Expectant mgt for now.  #Pain: Does not desire epidural  #FWB: Cat 1 strip #ID: GBS negative   #MOF: breast  #MOC: POPs #Circ:  Desires  #Low grade squamous intraepith lesion on cytologic smear cervix (lgsil): [facility]   VHenriettefor WDe[employer] Company CDurango2/10/2020, 11:24 PM

## 2020-05-25 NOTE — MAU Provider Note (Signed)
Event Date/Time  First Provider Initiated Contact with Patient 05/25/20 2220      S: Ms. Lisa Reese is a 30 y.o. G2P1001 at [redacted]w[redacted]d  who presents to MAU today complaining of leaking of fluid and painful contractions since 2130 hours. She denies vaginal bleeding. She endorses contractions. She reports normal fetal movement.    O: BP 122/77 (BP Location: Right Arm)   Pulse 82   Temp 98.8 F (37.1 C) (Oral)   Resp 18   Ht 5\' 4"  (1.626 m)   Wt 89.4 kg   LMP 08/16/2019   SpO2 100%   BMI 33.81 kg/m  GENERAL: Well-developed, well-nourished female in no acute distress.  HEAD: Normocephalic, atraumatic.  CHEST: Normal effort of breathing, regular heart rate ABDOMEN: Soft, nontender, gravid PELVIC: Normal external female genitalia. Vagina is pink and rugated. Cervix with normal contour, no lesions. Normal discharge.  Negative pooling.   Cervical exam:  Dilation: 4-4.5 Effacement (%): 60 Presentation: Vertex (confirmed by bedside U/S) Exam by:: [email or web address] CNM   Fetal Monitoring: Baseline: 115 Variability: Mod Accelerations: 15 x 15 Decelerations: None Contractions: Irregular q 1 -3  No results found for this or any previous visit (from the past 24 hour(s)).   A: SIUP at [redacted]w[redacted]d  Cat I tracing GBS neg Negative pooling, negative fern Cephalic by BSUS Membranes visibly protruding through os on spec exam 4-4.5 per my exam, 3.5 earlier today in office.  Patient vocalizing, reporting 10/10 pain with contractions Discussed with Labor Team  P: Admit to L&D  [redacted]w[redacted]d, CNM 05/25/2020 11:01 PM

## 2020-05-25 NOTE — MAU Note (Signed)
Pt reports contractions and ? ROM since 2130.

## 2020-05-25 NOTE — Progress Notes (Signed)
   PRENATAL VISIT NOTE  Subjective:  Lisa Reese is a 30 y.o. G2P1001 at [redacted]w[redacted]d being seen today for ongoing prenatal care.  She is currently monitored for the following issues for this low-risk pregnancy and has Low grade squamous intraepith lesion on cytologic smear cervix (lgsil); Back pain affecting pregnancy; Supervision of low-risk pregnancy, second trimester; Umbilical hernia without obstruction and without gangrene; Anemia affecting pregnancy; and Gastric reflux on their problem list.  Patient reports she is just tired and ready to have a baby.  Contractions: Irregular. Vag. Bleeding: None.  Movement: Present. Denies leaking of fluid.   Post dates BPP 10/10 today.  Reviewed with pt.  The following portions of the patient's history were reviewed and updated as appropriate: allergies, current medications, past family history, past medical history, past social history, past surgical history and problem list.   Objective:   Vitals:   05/25/20 0932  BP: 121/66  Pulse: 94  Weight: 197 lb 9.6 oz (89.6 kg)    Fetal Status: Fetal Heart Rate (bpm): NST   Movement: Present  Presentation: Vertex  General:  Alert, oriented and cooperative. Patient is in no acute distress.  Skin: Skin is warm and dry. No rash noted.   Cardiovascular: Normal heart rate noted  Respiratory: Normal respiratory effort, no problems with respiration noted  Abdomen: Soft, gravid, appropriate for gestational age.  Pain/Pressure: Present     Pelvic: Cervical exam performed in the presence of a chaperone Dilation: 3.5 Effacement (%): 30 Station: -3  Extremities: Normal range of motion.  Edema: None  Mental Status: Normal mood and affect. Normal behavior. Normal judgment and thought content.   Assessment and Plan:  Pregnancy: G2P1001 at [redacted]w[redacted]d 1. [redacted] weeks gestation of pregnancy - on PNV - Induction scheduled for 05/27/2020.  Orders placed.    2. Encounter for supervision of low-risk pregnancy, antepartum  3.  Post term pregnancy, antepartum condition or complication - BPP reassuring today.  Term labor symptoms and general obstetric precautions including but not limited to vaginal bleeding, contractions, leaking of fluid and fetal movement were reviewed in detail with the patient. Please refer to After Visit Summary for other counseling recommendations.    Future Appointments  Date Time Provider Department Center  05/27/2020  6:30 AM [facility] SCHED ROOM [facility] None    Jerene Bears, MD

## 2020-05-25 NOTE — Telephone Encounter (Signed)
Preadmission screen  

## 2020-05-25 NOTE — H&P (Incomplete)
OBSTETRIC ADMISSION HISTORY AND PHYSICAL  Lisa Reese is a 30 y.o. female G2P1001 with IUP at 71w3dby LMP presenting for SOL. She reports +FMs, leaking of fluid, no VB, no blurry vision, headaches or peripheral edema, and RUQ pain.  She plans on breast feeding. She requests POP for birth control. [facility]  Dating: By LMP --->  Estimated Date of Delivery: 05/22/20  Sono:    '@[redacted]w[redacted]d' , CWD, normal anatomy, cephalic presentation,  4182X 78% EFW   Prenatal History/Complications:  Low grade squamous intraepith lesion on cytologic smear cervix (lgsil)  Past Medical History: Past Medical History:  Diagnosis Date  . Hyperemesis gravidarum   . Vaginal Pap smear, abnormal     Past Surgical History: Past Surgical History:  Procedure Laterality Date  . NO PAST SURGERIES      Obstetrical History: OB History    Gravida  2   Para  1   Term  1   Preterm  0   AB  0   Living  1     SAB  0   IAB  0   Ectopic  0   Multiple  0   Live Births  1           Social History Social History   Socioeconomic History  . Marital status: Married    Spouse name: Not on file  . Number of children: Not on file  . Years of education: Not on file  . Highest education level: Not on file  Occupational History  . Not on file  Tobacco Use  . Smoking status: Never Smoker  . Smokeless tobacco: Never Used  Vaping Use  . Vaping Use: Never used  Substance and Sexual Activity  . Alcohol use: No  . Drug use: No  . Sexual activity: Yes    Birth control/protection: None  Other Topics Concern  . Not on file  Social History Narrative  . Not on file   Social Determinants of Health   Financial Resource Strain: Not on file  Food Insecurity: No Food Insecurity  . Worried About RCharity fundraiserin the Last Year: Never true  . Ran Out of Food in the Last Year: Never true  Transportation Needs: No Transportation Needs  . Lack of Transportation  (Medical): No  . Lack of Transportation (Non-Medical): No  Physical Activity: Not on file  Stress: Not on file  Social Connections: Not on file    Family History: Family History  Problem Relation Age of Onset  . Diabetes Father     Allergies: No Known Allergies  Pt denies allergies to latex, iodine, or shellfish.  Medications Prior to Admission  Medication Sig Dispense Refill Last Dose  . famotidine (PEPCID) 20 MG tablet Take 1 tablet by mouth twice daily 30 tablet 0 05/25/2020 at Unknown time  . Prenatal Vit-Fe Fumarate-FA (MULTIVITAMIN-PRENATAL) 27-0.8 MG TABS tablet Take 1 tablet by mouth daily.   05/25/2020 at Unknown time  . Blood Pressure KIT 1 Units by Does not apply route once a week. To monitor BP weekly for low risk pregnancy (z34.90) (Patient not taking: Reported on 05/25/2020) 1 kit 0   . hydrocortisone (ANUSOL-HC) 25 MG suppository Place 1 suppository (25 mg total) rectally 2 (two) times daily. (Patient not taking: No sig reported) 12 suppository 0   . Hydrocortisone Acetate 1 % OINT Apply 1 application topically in the morning and at bedtime. (Patient not taking: Reported on 05/25/2020) 28.4  g 0      Review of Systems   All systems reviewed and negative except as stated in HPI  Blood pressure 122/77, pulse 82, temperature 98.8 F (37.1 C), temperature source Oral, resp. rate 18, height '5\' 4"'  (1.626 m), weight 89.4 kg, last menstrual period 08/16/2019, SpO2 100 %, unknown if currently breastfeeding. General appearance: alert, cooperative and no distress Lungs: normal WOB Heart: regular rate Extremities: no sign of DVT Presentation: cephalic Fetal monitoringBaseline: 125 bpm, Variability: Good {> 6 bpm), Accelerations: Reactive and Decelerations: Absent Uterine activityFrequency: Irregular  Dilation: 4.5 Effacement (%): 60 Exam by:: Maryelizabeth Kaufmann CNM   Prenatal labs: ABO, Rh: --/--/PENDING (02/07 2251) Antibody: PENDING (02/07 2251) Rubella: 9.74 (08/19 1023) RPR:  Non Reactive (11/17 0827)  HBsAg: Negative (08/19 1023)  HIV: Non Reactive (11/17 0827)  GBS: Negative/-- (01/11 1151)  2 hr Glucola: *** Genetic screening:  *** Anatomy US: ***  Prenatal Transfer Tool  Maternal Diabetes: No Genetic Screening: Normal Maternal Ultrasounds/Referrals: Normal Fetal Ultrasounds or other Referrals:  None Maternal Substance Abuse:  No Significant Maternal Medications:  None Significant Maternal Lab Results: None  Results for orders placed or performed during the hospital encounter of 05/25/20 (from the past 24 hour(s))  CBC   Collection Time: 05/25/20 10:51 PM  Result Value Ref Range   WBC 7.5 4.0 - 10.5 K/uL   RBC 4.13 3.87 - 5.11 MIL/uL   Hemoglobin 12.9 12.0 - 15.0 g/dL   HCT 38.7 36.0 - 46.0 %   MCV 93.7 80.0 - 100.0 fL   MCH 31.2 26.0 - 34.0 pg   MCHC 33.3 30.0 - 36.0 g/dL   RDW 14.3 11.5 - 15.5 %   Platelets 198 150 - 400 K/uL   nRBC 0.0 0.0 - 0.2 %  Type and screen   Collection Time: 05/25/20 10:51 PM  Result Value Ref Range   ABO/RH(D) PENDING    Antibody Screen PENDING    Sample Expiration      05/28/2020,2359 Performed at [facility], [place] [address] [place], [place] [address]   [name] Test   Collection Time: 05/25/20 11:20 PM  Result Value Ref Range   POCT Fern Test Negative = intact amniotic membranes     Patient Active Problem List   Diagnosis Date Noted  . Post-dates pregnancy 05/25/2020  . Anemia affecting pregnancy 01/30/2020  . Gastric reflux 01/30/2020  . Umbilical hernia without obstruction and without gangrene 01/02/2020  . Supervision of low-risk pregnancy, second trimester 12/05/2019  . Back pain affecting pregnancy 10/04/2019  . Low grade squamous intraepith lesion on cytologic smear cervix (lgsil) 10/12/2016    Assessment/Plan:  Lisa Reese is a 30 y.o. G2P1001 at 6w3dhere for SOL   #Labor: SVE 4.5cm in MAU. Bulging bag. Can consider AROM and starting Pitocin at next check #Pain: Does not  desire epidural  #FWB: Cat 1 strip #ID: GBS negative  #MOF: breast  #MOC: POPs #Circ:  Desires  #Low grade squamous intraepith lesion on cytologic smear cervix (lgsil): [facility]   VMarshallvillefor WDean Foods Company CEast Bet[place]2/10/2020, 11:24 PM

## 2020-05-26 ENCOUNTER — Inpatient Hospital Stay (HOSPITAL_COMMUNITY): Payer: Medicaid Other | Admitting: Anesthesiology

## 2020-05-26 ENCOUNTER — Other Ambulatory Visit (HOSPITAL_COMMUNITY): Payer: Medicaid Other

## 2020-05-26 ENCOUNTER — Encounter (HOSPITAL_COMMUNITY): Payer: Self-pay | Admitting: Obstetrics & Gynecology

## 2020-05-26 DIAGNOSIS — O480 Post-term pregnancy: Secondary | ICD-10-CM | POA: Diagnosis not present

## 2020-05-26 DIAGNOSIS — Z3A40 40 weeks gestation of pregnancy: Secondary | ICD-10-CM | POA: Diagnosis not present

## 2020-05-26 LAB — RPR: RPR Ser Ql: NONREACTIVE

## 2020-05-26 LAB — TYPE AND SCREEN
ABO/RH(D): O POS
Antibody Screen: NEGATIVE

## 2020-05-26 LAB — SARS CORONAVIRUS 2 BY RT PCR (HOSPITAL ORDER, PERFORMED IN ~~LOC~~ HOSPITAL LAB): SARS Coronavirus 2: NEGATIVE

## 2020-05-26 MED ORDER — EPHEDRINE 5 MG/ML INJ: 10.0000 mg | INTRAVENOUS | Status: DC | PRN

## 2020-05-26 MED ORDER — LACTATED RINGERS IV SOLN
500.0000 mL | Freq: Once | INTRAVENOUS | Status: AC
  Administered 2020-05-26: 500 mL via INTRAVENOUS

## 2020-05-26 MED ORDER — BENZOCAINE-MENTHOL 20-0.5 % EX AERO
1.0000 "application " | INHALATION_SPRAY | CUTANEOUS | Status: DC | PRN
  Administered 2020-05-28: 1 via TOPICAL
  Filled 2020-05-26: qty 56

## 2020-05-26 MED ORDER — WITCH HAZEL-GLYCERIN EX PADS: 1.0000 "application " | MEDICATED_PAD | CUTANEOUS | Status: DC | PRN

## 2020-05-26 MED ORDER — MISOPROSTOL 200 MCG PO TABS
ORAL_TABLET | ORAL | Status: AC
  Filled 2020-05-26: qty 4

## 2020-05-26 MED ORDER — TERBUTALINE SULFATE 1 MG/ML IJ SOLN: 0.2500 mg | Freq: Once | INTRAMUSCULAR | Status: DC | PRN

## 2020-05-26 MED ORDER — PHENYLEPHRINE 40 MCG/ML (10ML) SYRINGE FOR IV PUSH (FOR BLOOD PRESSURE SUPPORT): 80.0000 ug | PREFILLED_SYRINGE | INTRAVENOUS | Status: DC | PRN

## 2020-05-26 MED ORDER — IBUPROFEN 600 MG PO TABS
600.0000 mg | ORAL_TABLET | Freq: Four times a day (QID) | ORAL | Status: DC
  Administered 2020-05-26 – 2020-05-28 (×7): 600 mg via ORAL
  Filled 2020-05-26 (×7): qty 1

## 2020-05-26 MED ORDER — DIPHENHYDRAMINE HCL 50 MG/ML IJ SOLN: 12.5000 mg | INTRAMUSCULAR | Status: DC | PRN

## 2020-05-26 MED ORDER — ONDANSETRON HCL 4 MG/2ML IJ SOLN: 4.0000 mg | INTRAMUSCULAR | Status: DC | PRN

## 2020-05-26 MED ORDER — ACETAMINOPHEN 325 MG PO TABS
650.0000 mg | ORAL_TABLET | ORAL | Status: DC | PRN
  Administered 2020-05-26 – 2020-05-28 (×5): 650 mg via ORAL
  Filled 2020-05-26 (×6): qty 2

## 2020-05-26 MED ORDER — LIDOCAINE HCL (PF) 1 % IJ SOLN
INTRAMUSCULAR | Status: DC | PRN
  Administered 2020-05-26 (×2): 4 mL via EPIDURAL

## 2020-05-26 MED ORDER — PHENYLEPHRINE 40 MCG/ML (10ML) SYRINGE FOR IV PUSH (FOR BLOOD PRESSURE SUPPORT)
80.0000 ug | PREFILLED_SYRINGE | INTRAVENOUS | Status: DC | PRN
  Filled 2020-05-26: qty 10

## 2020-05-26 MED ORDER — ZOLPIDEM TARTRATE 5 MG PO TABS: 5.0000 mg | ORAL_TABLET | Freq: Every evening | ORAL | Status: DC | PRN

## 2020-05-26 MED ORDER — DIPHENHYDRAMINE HCL 25 MG PO CAPS
25.0000 mg | ORAL_CAPSULE | Freq: Four times a day (QID) | ORAL | Status: DC | PRN
Start: 2020-05-26 — End: 2020-05-28

## 2020-05-26 MED ORDER — TETANUS-DIPHTH-ACELL PERTUSSIS 5-2.5-18.5 LF-MCG/0.5 IM SUSY: 0.5000 mL | PREFILLED_SYRINGE | Freq: Once | INTRAMUSCULAR | Status: DC

## 2020-05-26 MED ORDER — SIMETHICONE 80 MG PO CHEW: 80.0000 mg | CHEWABLE_TABLET | ORAL | Status: DC | PRN

## 2020-05-26 MED ORDER — DIBUCAINE (PERIANAL) 1 % EX OINT: 1.0000 "application " | TOPICAL_OINTMENT | CUTANEOUS | Status: DC | PRN

## 2020-05-26 MED ORDER — OXYTOCIN-SODIUM CHLORIDE 30-0.9 UT/500ML-% IV SOLN
1.0000 m[IU]/min | INTRAVENOUS | Status: DC
  Administered 2020-05-26: 2 m[IU]/min via INTRAVENOUS
  Filled 2020-05-26: qty 500

## 2020-05-26 MED ORDER — COCONUT OIL OIL
1.0000 "application " | TOPICAL_OIL | Status: DC | PRN
  Administered 2020-05-27: 1 via TOPICAL

## 2020-05-26 MED ORDER — SENNOSIDES-DOCUSATE SODIUM 8.6-50 MG PO TABS
2.0000 | ORAL_TABLET | Freq: Every day | ORAL | Status: DC
  Administered 2020-05-27 – 2020-05-28 (×2): 2 via ORAL
  Filled 2020-05-26 (×2): qty 2

## 2020-05-26 MED ORDER — FENTANYL-BUPIVACAINE-NACL 0.5-0.125-0.9 MG/250ML-% EP SOLN
EPIDURAL | Status: DC | PRN
  Administered 2020-05-26: 12 mL/h via EPIDURAL

## 2020-05-26 MED ORDER — CALCIUM CARBONATE ANTACID 500 MG PO CHEW
1.0000 | CHEWABLE_TABLET | Freq: Every day | ORAL | Status: DC | PRN
  Administered 2020-05-26: 200 mg via ORAL
  Filled 2020-05-26: qty 1

## 2020-05-26 MED ORDER — TRANEXAMIC ACID-NACL 1000-0.7 MG/100ML-% IV SOLN
INTRAVENOUS | Status: AC
  Filled 2020-05-26: qty 100

## 2020-05-26 MED ORDER — PRENATAL MULTIVITAMIN CH
1.0000 | ORAL_TABLET | Freq: Every day | ORAL | Status: DC
  Administered 2020-05-27 – 2020-05-28 (×2): 1 via ORAL
  Filled 2020-05-26 (×2): qty 1

## 2020-05-26 MED ORDER — FENTANYL-BUPIVACAINE-NACL 0.5-0.125-0.9 MG/250ML-% EP SOLN
12.0000 mL/h | EPIDURAL | Status: DC | PRN
  Filled 2020-05-26: qty 250

## 2020-05-26 MED ORDER — ONDANSETRON HCL 4 MG PO TABS: 4.0000 mg | ORAL_TABLET | ORAL | Status: DC | PRN

## 2020-05-26 NOTE — Discharge Summary (Signed)
Postpartum Discharge Summary     Patient Name: Lisa Reese DOB: 05-Sep-1990 MRN: [id number]  Date of admission: 05/25/2020 Delivery date:05/26/2020  Delivering provider: Lenna Sciara  Date of discharge: 05/28/2020  Admitting diagnosis: Post-dates pregnancy [O48.0] Intrauterine pregnancy: [redacted]w[redacted]d    Secondary diagnosis:  Active Problems:   Post-dates pregnancy   SVD (spontaneous vaginal delivery)  Additional problems: LSIL on papsmear    Discharge diagnosis: Term Pregnancy Delivered                                              Post partum procedures:none Augmentation: AROM and Pitocin Complications: None  Hospital course: Onset of Labor With Vaginal Delivery      30y.o. yo G2P1001 at 43w4das admitted in Active Labor on 05/25/2020. Patient had an uncomplicated labor course as follows:  Membrane Rupture Time/Date: 1:34 AM ,05/26/2020   Delivery Method:Vaginal, Spontaneous  Episiotomy: None  Lacerations:  2nd degree  Patient had an uncomplicated postpartum course.  She is ambulating, tolerating a regular diet, passing flatus, and urinating well. Patient is discharged home in stable condition on 05/28/20.  Newborn Data: Birth date:05/26/2020  Birth time:4:19 PM  Gender:Female  Living status:Living  Apgars:9 ,9  Weight:4000 g   Magnesium Sulfate received: No BMZ received: No Rhophylac:N/A MMR:N/A T-DaP:Given prenatally Flu: Yes Transfusion:No  Physical exam  Vitals:   05/27/20 0730 05/27/20 1500 05/27/20 2108 05/28/20 0500  BP: 115/67 (!) 122/59 118/69 127/78  Pulse: 74 99 84 80  Resp: '16 17 20 18  ' Temp: (!) 97.5 F (36.4 C) 98.3 F (36.8 C) 98 F (36.7 C) 97.9 F (36.6 C)  TempSrc: Oral Oral Oral Oral  SpO2: 99% 100% 99% 100%  Weight:      Height:       General: alert, cooperative and no distress Lochia: appropriate Uterine Fundus: firm Incision: N/A DVT Evaluation: No evidence of DVT seen on physical exam. Labs: Lab Results  Component Value Date    WBC 7.5 05/25/2020   HGB 12.9 05/25/2020   HCT 38.7 05/25/2020   MCV 93.7 05/25/2020   PLT 198 05/25/2020   CMP Latest Ref Rng & Units 11/01/2019  Glucose 70 - 99 mg/dL 92  BUN 6 - 20 mg/dL 7  Creatinine 0.44 - 1.00 mg/dL 0.56  Sodium 135 - 145 mmol/L 136  Potassium 3.5 - 5.1 mmol/L 3.3(L)  Chloride 98 - 111 mmol/L 105  CO2 22 - 32 mmol/L 20(L)  Calcium 8.9 - 10.3 mg/dL 9.8  Total Protein 6.5 - 8.1 g/dL 8.3(H)  Total Bilirubin 0.3 - 1.2 mg/dL 0.7  Alkaline Phos 38 - 126 U/L 41  AST 15 - 41 U/L 19  ALT 0 - 44 U/L 22   Edinburgh Score: Edinburgh Postnatal Depression Scale Screening Tool 05/26/2020  I have been able to laugh and see the funny side of things. 0  I have looked forward with enjoyment to things. 0  I have blamed myself unnecessarily when things went wrong. 0  I have been anxious or worried for no good reason. 0  I have felt scared or panicky for no good reason. 0  Things have been getting on top of me. 0  I have been so unhappy that I have had difficulty sleeping. 0  I have felt sad or miserable. 0  I have been so unhappy that I  have been crying. 0  The thought of harming myself has occurred to me. 0  Edinburgh Postnatal Depression Scale Total 0     After visit meds:  Allergies as of 05/28/2020   No Known Allergies     Medication List    TAKE these medications   acetaminophen 325 MG tablet Commonly known as: Tylenol Take 2 tablets (650 mg total) by mouth every 6 (six) hours as needed (for pain scale < 4).   benzocaine-Menthol 20-0.5 % Aero Commonly known as: DERMOPLAST Apply 1 application topically as needed for irritation (perineal discomfort).   Blood Pressure Kit 1 Units by Does not apply route once a week. To monitor BP weekly for low risk pregnancy (z34.90)   coconut oil Oil Apply 1 application topically as needed.   famotidine 20 MG tablet Commonly known as: PEPCID Take 1 tablet by mouth twice daily   hydrocortisone 25 MG  suppository Commonly known as: ANUSOL-HC Place 1 suppository (25 mg total) rectally 2 (two) times daily.   Hydrocortisone Acetate 1 % Oint Apply 1 application topically in the morning and at bedtime.   ibuprofen 600 MG tablet Commonly known as: ADVIL Take 1 tablet (600 mg total) by mouth every 6 (six) hours.   norethindrone 0.35 MG tablet Commonly known as: Ortho Micronor Take 1 tablet (0.35 mg total) by mouth daily.   polyethylene glycol powder 17 GM/SCOOP powder Commonly known as: MiraLax Take one to two caps-full of powder in liquid daily as needed for constipation   prenatal multivitamin Tabs tablet Take 1 tablet by mouth daily at 12 noon.       Discharge home in stable condition Infant Feeding: Bottle and Breast Infant Disposition:home with mother Discharge instruction: per After Visit Summary and Postpartum booklet. Activity: Advance as tolerated. Pelvic rest for 6 weeks.  Diet: routine diet Future Appointments: Future Appointments  Date Time Provider [place]  06/23/2020  2:15 PM Clarnce Flock, MD [facility]   Follow up Visit:   Please schedule this patient for a in person postpartum visit in 4 weeks with the following provider: Any provider. Additional Postpartum F/U: pap at postpartum visit  Low risk pregnancy complicated by: n/a Delivery mode:  Vaginal, Spontaneous  Anticipated Birth Control:  POPs   05/28/2020 Janet Berlin, MD

## 2020-05-26 NOTE — Progress Notes (Signed)
Labor Progress Note Lisa Reese is a 30 y.o. G2P1001 at [redacted]w[redacted]d presented for labor  S:  Feeling ctx and pelvic pressure  O:  BP 118/68   Pulse 87   Temp (!) 97.3 F (36.3 C) (Axillary)   Resp 18   Ht 5\' 4"  (1.626 m)   Wt 89.4 kg   LMP 08/16/2019   SpO2 100%   BMI 33.81 kg/m  EFM: baseline 125 bpm/ mod variability/ no accels/ early decels  Toco/IUPC: 2-4 SVE: 7/90/0 Pitocin: 6 mu/min  A/P: 30 y.o. G2P1001 [redacted]w[redacted]d  1. Labor: active, progressing well 2. FWB: Cat I 3. Pain: analgesia/anesthesia prn  Anticipate labor progression and SVD.  [redacted]w[redacted]d, CNM 1:59 PM

## 2020-05-26 NOTE — Lactation Note (Signed)
This note was copied from a baby's chart. Lactation Consultation Note  Patient Name: Lisa Reese Date: 05/26/2020 Reason for consult: L&D Initial assessment;Term Age:28 hours  Visited with mom of 1 hours old FT female, she's a P2. She's concerned about low milk supply, reviewed hand expression and showed mom how much colostrum she had, she was able to express colostrum very easily, praised her for her efforts.  L&D RN reported that baby had already latched for 20 minutes, LC and RN Crystal offered latch assistance again, noticed that baby had a shallow latch and kept sucking on the nipple. LC helped mom with repositioning baby and he was able to relatch but noticed that mom would not hold the back of the neck as suggested, she kept trying to leave some "space" between the breast and baby's nose which resulted on a shallow latch.  Explained to mom again the consequences of a shallow latch and provided reassurance that baby can breath when at the breast. He fed for about 7 minutes with a few audible swallows noted until he self-released from the breast. Reviewed normal newborn behavior, feeding cues, cluster feeding and size of baby's stomach.  Feeding plan:  1. Encouraged mom to feed baby STS 8-12 times/24 hours or sooner if feeding cues are present 2. Hand expression and spoon feeding were also encouraged  No literature provided due to the nature of this L&D consultation; mom willl need a LC brochure and resources once she gets transferred to [facility]. FOB present and supportive. Parents reported all questions and concerns were answered, they're aware of LC OP services and will call PRN.   Maternal Data Has patient been taught Hand Expression?: Yes Does the patient have breastfeeding experience prior to this delivery?: Yes How long did the patient breastfeed?: 3 months  Feeding Mother's Current Feeding Choice: Breast Milk and Formula  LATCH Score Latch: Grasps breast easily,  tongue down, lips flanged, rhythmical sucking.  Audible Swallowing: A few with stimulation  Type of Nipple: Everted at rest and after stimulation  Comfort (Breast/Nipple): Soft / non-tender  Hold (Positioning): Assistance needed to correctly position infant at breast and maintain latch.  LATCH Score: 8   Lactation Tools Discussed/Used    Interventions Interventions: Breast feeding basics reviewed;Assisted with latch;Skin to skin;Breast massage;Hand express;Breast compression;Adjust position;Support pillows  Discharge Pump: Personal (2 pumps at home, one of them is Medela DEBP) WIC Program: Yes  Consult Status Consult Status: Follow-up Date: 05/26/20 Follow-up type: In-patient    Lisa Reese 05/26/2020, 6:01 PM

## 2020-05-26 NOTE — Anesthesia Procedure Notes (Signed)
Epidural Patient location during procedure: OB Start time: 05/26/2020 10:14 AM End time: 05/26/2020 10:17 AM  Staffing Anesthesiologist: Kaylyn Layer, MD Performed: anesthesiologist   Preanesthetic Checklist Completed: patient identified, IV checked, risks and benefits discussed, monitors and equipment checked, pre-op evaluation and timeout performed  Epidural Patient position: sitting Prep: DuraPrep and site prepped and draped Patient monitoring: continuous pulse ox, blood pressure and heart rate Approach: midline Location: L3-L4 Injection technique: LOR air  Needle:  Needle type: Tuohy  Needle gauge: 17 G Needle length: 9 cm Needle insertion depth: 5 cm Catheter type: closed end flexible Catheter size: 19 Gauge Catheter at skin depth: 10 cm Test dose: negative and Other (1% lidocaine)  Assessment Events: blood not aspirated, injection not painful, no injection resistance, no paresthesia and negative IV test  Additional Notes Patient identified. Risks, benefits, and alternatives discussed with patient including but not limited to bleeding, infection, nerve damage, paralysis, failed block, incomplete pain control, headache, blood pressure changes, nausea, vomiting, reactions to medication, itching, and postpartum back pain. Confirmed with bedside nurse the patient's most recent platelet count. Confirmed with patient that they are not currently taking any anticoagulation, have any bleeding history, or any family history of bleeding disorders. Patient expressed understanding and wished to proceed. All questions were answered. Sterile technique was used throughout the entire procedure. Please see nursing notes for vital signs.   Crisp LOR on first pass. Test dose was given through epidural catheter and negative prior to continuing to dose epidural or start infusion. Warning signs of high block given to the patient including shortness of breath, tingling/numbness in hands, complete  motor block, or any concerning symptoms with instructions to call for help. Patient was given instructions on fall risk and not to get out of bed. All questions and concerns addressed with instructions to call with any issues or inadequate analgesia.  Reason for block:procedure for pain

## 2020-05-26 NOTE — Progress Notes (Signed)
Labor Progress Note Lisa Reese is a 30 y.o. G2P1001 at [redacted]w[redacted]d presented for SOL S: Patient is resting comfortably.  O:  BP (!) 128/59   Pulse 85   Temp 98.9 F (37.2 C) (Axillary)   Resp 18   Ht 5\' 4"  (1.626 m)   Wt 89.4 kg   LMP 08/16/2019   SpO2 99%   BMI 33.81 kg/m  EFM: baseline HR 125/mod variability/ accels present, no decels   CVE: Dilation: 6.5 Effacement (%): 80 Station: -2 Presentation: Vertex Exam by:: [email or web address], CNM   A&P: 30 y.o. G2P1001 [redacted]w[redacted]d presented for SOL #Labor: Progressing well. SVE 6.5. AROM w/ clear fluid. 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

## 2020-05-26 NOTE — Lactation Note (Signed)
This note was copied from a baby's chart. Lactation Consultation Note  Patient Name: Boy Saliyah Gillin [id number] Date: 05/26/2020 Reason for consult: Initial assessment;Term Age:28 hours P2, term female infant, this is infant's 2nd time latching at the breast. Mom latched infant on her right breast using the football hold position, infant latched with depth, was still BF when LC left the room. LC discussed with mom, do breast compression to keep infant awake to BF such as: gently stroking infant's neck and shoulder, talking to infant, do breast compressions and BF infant STS. Mom knows to BF infant according to cues, 8 to 12+ times , STS. Mom knows to call RN or LC if she needs further assistance with latching infant at the breast. Mom made aware of O/P services, breastfeeding support groups, community resources, and our phone # for post-discharge questions.  Maternal Data Has patient been taught Hand Expression?: Yes Does the patient have breastfeeding experience prior to this delivery?: Yes How long did the patient breastfeed?: Per mom, she BF her 56 year old son for 3 months.  Feeding Mother's Current Feeding Choice: Breast Milk and Formula  LATCH Score Latch: Grasps breast easily, tongue down, lips flanged, rhythmical sucking.  Audible Swallowing: Spontaneous and intermittent  Type of Nipple: Everted at rest and after stimulation  Comfort (Breast/Nipple): Soft / non-tender  Hold (Positioning): Assistance needed to correctly position infant at breast and maintain latch.  LATCH Score: 9   Lactation Tools Discussed/Used    Interventions Interventions: Breast feeding basics reviewed;Assisted with latch;Skin to skin;Breast massage;Hand express;Breast compression;Adjust position;Support pillows;Position options;Expressed milk  Discharge Pump: Personal WIC Program: Yes  Consult Status Consult Status: Follow-up Date: 05/27/20 Follow-up type: In-patient    Danelle Earthly 05/26/2020, 10:57 PM

## 2020-05-26 NOTE — Anesthesia Preprocedure Evaluation (Addendum)
Anesthesia Evaluation  Patient identified by MRN, date of birth, ID band Patient awake    Reviewed: Allergy & Precautions, Patient's Chart, lab work & pertinent test results  History of Anesthesia Complications Negative for: history of anesthetic complications  Airway Mallampati: II  TM Distance: >3 FB Neck ROM: Full    Dental no notable dental hx.    Pulmonary neg pulmonary ROS,    Pulmonary exam normal        Cardiovascular negative cardio ROS Normal cardiovascular exam     Neuro/Psych negative neurological ROS  negative psych ROS   GI/Hepatic negative GI ROS, Neg liver ROS,   Endo/Other  negative endocrine ROS  Renal/GU negative Renal ROS  negative genitourinary   Musculoskeletal negative musculoskeletal ROS (+)   Abdominal   Peds  Hematology negative hematology ROS (+)   Anesthesia Other Findings Day of surgery medications reviewed with patient.  Reproductive/Obstetrics (+) Pregnancy                             Anesthesia Physical Anesthesia Plan  ASA: II  Anesthesia Plan: Epidural   Post-op Pain Management:    Induction:   PONV Risk Score and Plan: Treatment may vary due to age or medical condition  Airway Management Planned: Natural Airway  Additional Equipment:   Intra-op Plan:   Post-operative Plan:   Informed Consent: I have reviewed the patients History and Physical, chart, labs and discussed the procedure including the risks, benefits and alternatives for the proposed anesthesia with the patient or authorized representative who has indicated his/her understanding and acceptance.       Plan Discussed with:   Anesthesia Plan Comments:         Anesthesia Quick Evaluation  

## 2020-05-27 ENCOUNTER — Inpatient Hospital Stay (HOSPITAL_COMMUNITY): Payer: Medicaid Other

## 2020-05-27 ENCOUNTER — Inpatient Hospital Stay (HOSPITAL_COMMUNITY)
Admission: AD | Admit: 2020-05-27 | Payer: Medicaid Other | Source: Home / Self Care | Admitting: Obstetrics and Gynecology

## 2020-05-27 NOTE — Progress Notes (Addendum)
POSTPARTUM PROGRESS NOTE  Subjective: Lisa Reese is a 30 y.o. T0P5465 s/p SVD at [redacted]w[redacted]d.  She reports she doing well. No acute events overnight. She denies any problems with ambulating, voiding or po intake. Denies nausea or vomiting. She has not passed flatus. Pain is moderately controlled.  Lochia is minimal. She requests an abdominal binder.   Objective: Blood pressure 136/75, pulse 80, temperature 97.6 F (36.4 C), temperature source Oral, resp. rate 18, height 5\' 4"  (1.626 m), weight 89.4 kg, last menstrual period 08/16/2019, SpO2 99 %, unknown if currently breastfeeding.  Physical Exam:  General: alert, cooperative and no distress Chest: no respiratory distress Abdomen: soft, non-tender  Uterine Fundus: firm and at level of umbilicus Extremities: No calf swelling or tenderness  no edema  Recent Labs    05/25/20 2251  HGB 12.9  HCT 38.7    Assessment/Plan: Lisa Reese is a 31 y.o. 34 s/p SVD at [redacted]w[redacted]d for SOL. PPD#1  Routine Postpartum Care: Doing well, pain well-controlled.  -- Continue routine care, lactation support  -- Contraception: POP -- Feeding: breast and bottle   Dispo: Plan for discharge tomorrow, 05/28/20  07/26/20, DO 05/27/2020 7:51 AM   I saw and evaluated the patient. I agree with the findings and the plan of care as documented in the resident's note.  07/25/2020, MD [facility] Fellow, [facility] for [facility], [facility]

## 2020-05-27 NOTE — Lactation Note (Signed)
This note was copied from a baby's chart. Lactation Consultation Note  Patient Name: Lisa Reese [id number] Date: 05/27/2020 Reason for consult: Follow-up assessment Age:28 hours   P2 mother whose infant is now 39 hours old.  This is a term baby at 40+4 weeks.  Mother breast fed her first child (now 71 years old) for 3 months.  Mother is breast feeding and supplementing with formula.  Mother was changing baby's diaper when I arrived.  She had no questions/concerns related to breast feeding.  Her goal was to exclusively breast feed, however, informed me that her son is a very hungry Lisa and loves to eat.  She had to supplement with formula in the hospital but will exclusively breast feed once her milk transitions.  Reassured her that this is okay and to always breast feed prior to any supplementation.  I did not observe a feeding but the RN has observed her feeding and the LATCH score was a 10.  She noted baby had multiple swallows during the feeding.  Discussed cluster feeding tonight.  Mother will continue to feed 8-12 times/24 hours or sooner if baby desires.  She will call for latch assistance as needed.  No support person present at this time.   Maternal Data    Feeding Nipple Type: Slow - flow  LATCH Score Latch: Grasps breast easily, tongue down, lips flanged, rhythmical sucking.  Audible Swallowing: Spontaneous and intermittent  Type of Nipple: Everted at rest and after stimulation  Comfort (Breast/Nipple): Soft / non-tender  Hold (Positioning): No assistance needed to correctly position infant at breast.  LATCH Score: 10   Lactation Tools Discussed/Used    Interventions    Discharge    Consult Status Consult Status: Follow-up Date: 05/28/20 Follow-up type: In-patient    [name] 05/27/2020, 4:56 PM

## 2020-05-27 NOTE — Anesthesia Postprocedure Evaluation (Signed)
Anesthesia Post Note  Patient: Lisa Reese  Procedure(s) Performed: AN AD HOC LABOR EPIDURAL     Patient location during evaluation: Mother Baby Anesthesia Type: Epidural Level of consciousness: awake and alert and oriented Pain management: satisfactory to patient Vital Signs Assessment: post-procedure vital signs reviewed and stable Respiratory status: respiratory function stable Cardiovascular status: stable Postop Assessment: no headache, no backache, epidural receding, patient able to bend at knees, no signs of nausea or vomiting, adequate PO intake and able to ambulate Anesthetic complications: no   No complications documented.  Last Vitals:  Vitals:   05/27/20 0340 05/27/20 0730  BP: 136/75 115/67  Pulse: 80 74  Resp: 18 16  Temp: 36.4 C (!) 36.4 C  SpO2:  99%    Last Pain:  Vitals:   05/27/20 0730  TempSrc: Oral  PainSc: 5    Pain Goal: Patients Stated Pain Goal: 2 (05/27/20 0730)                 Karleen Dolphin

## 2020-05-28 MED ORDER — BENZOCAINE-MENTHOL 20-0.5 % EX AERO: 1.0000 "application " | INHALATION_SPRAY | CUTANEOUS | 0 refills | Status: DC | PRN

## 2020-05-28 MED ORDER — IBUPROFEN 600 MG PO TABS: 600.0000 mg | ORAL_TABLET | Freq: Four times a day (QID) | ORAL | 0 refills | Status: DC

## 2020-05-28 MED ORDER — POLYETHYLENE GLYCOL 3350 17 GM/SCOOP PO POWD: ORAL | 0 refills | Status: DC

## 2020-05-28 MED ORDER — COCONUT OIL OIL: 1.0000 "application " | TOPICAL_OIL | 0 refills | Status: DC | PRN

## 2020-05-28 MED ORDER — NORETHINDRONE 0.35 MG PO TABS: 1.0000 | ORAL_TABLET | Freq: Every day | ORAL | 11 refills | Status: DC

## 2020-05-28 MED ORDER — ACETAMINOPHEN 325 MG PO TABS: 650.0000 mg | ORAL_TABLET | Freq: Four times a day (QID) | ORAL | 0 refills | Status: AC | PRN

## 2020-05-28 NOTE — Lactation Note (Signed)
This note was copied from a baby's chart. Lactation Consultation Note  Patient Name: Lisa Reese [id number] Date: 05/28/2020 Reason for consult: Follow-up assessment;Term Age:28 hours  LC Heather and LC Student Annabel Gibeau Katrinka Blazing arrived in the room where mother and baby were present. Father of the baby joined the room shortly after. Mother expressed she was experiencing pain while baby was latching. She described it as a pinching feeling. Mother stated it feels like the infant is using his gums.  [facility] Student Bettymae Yott received consent to take a look at the latch. LC Student Kariem Wolfson Katrinka Blazing noticed that the infant's lips were not flanged properly.  LC Student Daisey Caloca Katrinka Blazing demonstrated to the mother how to pull down the bottom lip in order to establish a deeper latch. Mother stated that it felt much better. Mother was feeding baby in the left cradle position on the left breast. LC Student Jamond Neels Katrinka Blazing discussed changes in breast, engorgement, cluster feeding, output, and nutrition basics. Mother does have a pump and home and her goal is to exclusively breastfeed. The infant will be seen at [facility].   PLAN: 1. Continue to feed on demand 8-12x a day alternating breast 2. Monitor pees and poops 3. Continue using the chin tug method to ensure infant has flanged lips  Maternal Data Does the patient have breastfeeding experience prior to this delivery?: No  Feeding Mother's Current Feeding Choice: Breast Milk  LATCH Score Latch: Repeated attempts needed to sustain latch, nipple held in mouth throughout feeding, stimulation needed to elicit sucking reflex.  Audible Swallowing: Spontaneous and intermittent  Type of Nipple: Everted at rest and after stimulation  Comfort (Breast/Nipple): Soft / non-tender  Hold (Positioning): No assistance needed to correctly position infant at breast.  LATCH Score: 9   Lactation Tools Discussed/Used    Interventions Interventions: Breast feeding  basics reviewed;Assisted with latch;Education  Discharge Discharge Education: Engorgement and breast care;Warning signs for feeding baby;Outpatient recommendation Pump: Personal  Consult Status Consult Status: Complete    Sanaii Caporaso Katrinka Blazing 05/28/2020, 11:44 AM

## 2020-06-01 ENCOUNTER — Telehealth: Payer: Self-pay

## 2020-06-01 NOTE — Telephone Encounter (Signed)
Received call from pt through nurse call voicemail. Pt states unable to eat after delivery. DOD: 05/26/2020 Pt requesting call back.

## 2020-06-02 NOTE — Telephone Encounter (Signed)
Spoke with pt. Pt states having nausea and vomiting x last 2-3 days after delivery on 05/26/20. Pt states is able to drink fruit smoothies and fruit juices like apple and prune juice the last 2 days. Denies feeling nausea and no vomiting for past 2 days. Pt encouraged to continue to drink eat smaller meals. Pt verbalized understanding. Pt is breastfeeding and is advised to continue to eat and drink for calories for baby as well. Pt agreeable.   Pt also asking and concerned with stitches after delivery. Pt had 2nd degree repair and states would like to come in earlier than PP visit to be seen for stitches. Pt advised will send message to front office to call and schedule. Pt verbalized understanding. Pt denies vaginal odor, itching on stitches or pain, redness.   Message sent to front office.   Judeth Cornfield, RN  Encounter closed

## 2020-06-23 ENCOUNTER — Ambulatory Visit: Payer: Medicaid Other | Admitting: Family Medicine

## 2020-07-01 ENCOUNTER — Encounter (HOSPITAL_COMMUNITY): Payer: Self-pay

## 2020-07-01 ENCOUNTER — Emergency Department (HOSPITAL_COMMUNITY): Payer: Medicaid Other

## 2020-07-01 ENCOUNTER — Other Ambulatory Visit: Payer: Self-pay

## 2020-07-01 ENCOUNTER — Emergency Department (HOSPITAL_COMMUNITY)
Admission: EM | Admit: 2020-07-01 | Discharge: 2020-07-01 | Disposition: A | Discharge status: Home / Self Care | Payer: Medicaid Other | Attending: Emergency Medicine | Admitting: Emergency Medicine

## 2020-07-01 DIAGNOSIS — O9963 Diseases of the digestive system complicating the puerperium: Secondary | ICD-10-CM | POA: Diagnosis present

## 2020-07-01 DIAGNOSIS — K219 Gastro-esophageal reflux disease without esophagitis: Secondary | ICD-10-CM | POA: Diagnosis not present

## 2020-07-01 DIAGNOSIS — K2960 Other gastritis without bleeding: Secondary | ICD-10-CM

## 2020-07-01 LAB — CBC WITH DIFFERENTIAL/PLATELET
Abs Immature Granulocytes: 0.01 10*3/uL (ref 0.00–0.07)
Basophils Absolute: 0.1 10*3/uL (ref 0.0–0.1)
Basophils Relative: 1 %
Eosinophils Absolute: 0.1 10*3/uL (ref 0.0–0.5)
Eosinophils Relative: 1 %
HCT: 39.2 % (ref 36.0–46.0)
Hemoglobin: 13 g/dL (ref 12.0–15.0)
Immature Granulocytes: 0 %
Lymphocytes Relative: 20 %
Lymphs Abs: 1.2 10*3/uL (ref 0.7–4.0)
MCH: 30.3 pg (ref 26.0–34.0)
MCHC: 33.2 g/dL (ref 30.0–36.0)
MCV: 91.4 fL (ref 80.0–100.0)
Monocytes Absolute: 0.5 10*3/uL (ref 0.1–1.0)
Monocytes Relative: 9 %
Neutro Abs: 4.3 10*3/uL (ref 1.7–7.7)
Neutrophils Relative %: 69 %
Platelets: 245 10*3/uL (ref 150–400)
RBC: 4.29 MIL/uL (ref 3.87–5.11)
RDW: 12.3 % (ref 11.5–15.5)
WBC: 6.2 10*3/uL (ref 4.0–10.5)
nRBC: 0 % (ref 0.0–0.2)

## 2020-07-01 LAB — I-STAT BETA HCG BLOOD, ED (MC, WL, AP ONLY): I-stat hCG, quantitative: <5 m[IU]/mL (ref <5)

## 2020-07-01 LAB — HEPATIC FUNCTION PANEL
ALT: 27 U/L (ref 0–44)
AST: 42 U/L — ABNORMAL HIGH (ref 15–41)
Albumin: 3.9 g/dL (ref 3.5–5.0)
Alkaline Phosphatase: 72 U/L (ref 38–126)
Bilirubin, Direct: <0.1 mg/dL (ref 0.0–0.2)
Total Bilirubin: 0.6 mg/dL (ref 0.3–1.2)
Total Protein: 8.1 g/dL (ref 6.5–8.1)

## 2020-07-01 LAB — LIPASE, BLOOD: Lipase: 47 U/L (ref 11–51)

## 2020-07-01 LAB — BASIC METABOLIC PANEL
Anion gap: 5 (ref 5–15)
BUN: 14 mg/dL (ref 6–20)
CO2: 25 mmol/L (ref 22–32)
Calcium: 9.4 mg/dL (ref 8.9–10.3)
Chloride: 105 mmol/L (ref 98–111)
Creatinine, Ser: 0.97 mg/dL (ref 0.44–1.00)
GFR, Estimated: >60 mL/min (ref >60)
Glucose, Bld: 83 mg/dL (ref 70–99)
Potassium: 3.9 mmol/L (ref 3.5–5.1)
Sodium: 135 mmol/L (ref 135–145)

## 2020-07-01 LAB — TROPONIN I (HIGH SENSITIVITY): Troponin I (High Sensitivity): 4 ng/L (ref <18)

## 2020-07-01 MED ORDER — FAMOTIDINE 20 MG PO TABS: 20.0000 mg | ORAL_TABLET | Freq: Two times a day (BID) | ORAL | 1 refills | Status: DC

## 2020-07-01 NOTE — ED Triage Notes (Addendum)
Assume care from EMS, EMS reports pt coming from home presenting with SOB & sternal epigastric chest pain. Pt states  this happen from time to time after given birth 1 month ago and especially after she eats

## 2020-07-01 NOTE — ED Notes (Signed)
Pt d/c by MD and is provided w/ d.c instructions and follow up care, Pt out of  ED ambulatory by self  

## 2020-07-01 NOTE — ED Notes (Signed)
Patient transported to X-ray 

## 2020-07-01 NOTE — ED Provider Notes (Signed)
Lisa Reese Provider Note   CSN: [id number] Arrival date & time: 07/01/20  2012     History Chief Complaint  Patient presents with  . Shortness of Breath  . Chest Pain    Lisa Reese is a 30 y.o. female who is approx 1 month post-partum (delivered on 05/26/20 at [id number] SVD, with uncomplicated postpartum course), presenting to the emergency [facility] with epigastric pain and discomfort.  The patient reports that for the past week she has had discomfort substernally that feels like squeezing, does not radiate anywhere.  This is occurred in 2 episodes within minutes of eating "heavy meal".  Her last episode was this evening around 6 pm.  She is currently completely asymptomatic.  She does suffer from acid reflux and heartburn in the past, but says that the symptoms felt different to her.  No fevers, chills, cough congestion.  She has had covid vaccines x 2.  No sick contacts in the house.  She is breast feeding currently.  She denies any history of cardiac disease, MI, smoking, family history of cardiac disease, personal history of diabetes or HTN or HLD.  No hemoptysis or asymmetric LE edema. Patient denies personal or family history of DVT or PE. No recent hormone use (including OCP); travel for >6 hours; prolonged immobilization for greater than 3 days; surgeries or trauma in the last 4 weeks; or malignancy with treatment within 6 months.   HPI     Past Medical History:  Diagnosis Date  . Hyperemesis gravidarum   . Vaginal Pap smear, abnormal     Patient Active Problem List   Diagnosis Date Noted  . SVD (spontaneous vaginal delivery) 05/26/2020  . Post-dates pregnancy 05/25/2020  . Anemia affecting pregnancy 01/30/2020  . Gastric reflux 01/30/2020  . Umbilical hernia without obstruction and without gangrene 01/02/2020  . Supervision of low-risk pregnancy, second trimester 12/05/2019  . Back pain affecting pregnancy 10/04/2019  .  Low grade squamous intraepith lesion on cytologic smear cervix (lgsil) 10/12/2016    Past Surgical History:  Procedure Laterality Date  . NO PAST SURGERIES       OB History    Gravida  2   Para  2   Term  2   Preterm  0   AB  0   Living  2     SAB  0   IAB  0   Ectopic  0   Multiple  0   Live Births  2           Family History  Problem Relation Age of Onset  . Diabetes Father     Social History   Tobacco Use  . Smoking status: Never Smoker  . Smokeless tobacco: Never Used  Vaping Use  . Vaping Use: Never used  Substance Use Topics  . Alcohol use: No  . Drug use: No    Home Medications Prior to Admission medications   Medication Sig Start Date End Date Taking? Authorizing Provider  acetaminophen (TYLENOL) 325 MG tablet Take 2 tablets (650 mg total) by mouth every 6 (six) hours as needed (for pain scale < 4). Patient taking differently: Take 650 mg by mouth every 6 (six) hours as needed for moderate pain or headache (for pain scale < 4). 05/28/20  Yes Marsala, JPlacido Sou MD  Blood Pressure KIT 1 Units by Does not apply route once a week. To monitor BP weekly for low risk pregnancy (z34.90) 12/05/19  Yes WKerry Hough  N, PA-C  coconut oil OIL Apply 1 application topically as needed. Patient taking differently: Apply 1 application topically as needed (dryness). 05/28/20  Yes Janet Berlin, MD  famotidine (PEPCID) 20 MG tablet Take 1 tablet (20 mg total) by mouth 2 (two) times daily. 07/01/20 07/31/20 Yes Yazen Rosko, Carola Rhine, MD  Prenatal Vit-Fe Fumarate-FA (PRENATAL MULTIVITAMIN) TABS tablet Take 1 tablet by mouth daily at 12 noon.   Yes [provider]  vitamin C (ASCORBIC ACID) 500 MG tablet Take 500 mg by mouth daily.   Yes [provider]  hydrocortisone (ANUSOL-HC) 25 MG suppository Place 1 suppository (25 mg total) rectally 2 (two) times daily. Patient not taking: No sig reported 05/06/20   Starr Lake, CNM    Allergies     Patient has no known allergies.  Review of Systems   Review of Systems  Constitutional: Negative for chills and fever.  HENT: Negative for ear pain and sore throat.   Eyes: Negative for pain and visual disturbance.  Respiratory: Positive for shortness of breath. Negative for cough.   Cardiovascular: Positive for chest pain. Negative for palpitations.  Gastrointestinal: Negative for abdominal pain and vomiting.  Genitourinary: Negative for dysuria and hematuria.  Musculoskeletal: Negative for arthralgias and back pain.  Skin: Negative for color change and rash.  Neurological: Negative for syncope and light-headedness.  All other systems reviewed and are negative.   Physical Exam Updated Vital Signs BP 118/74   Pulse 70   Temp 98.5 F (36.9 C) (Oral)   Resp 17   Ht '5\' 1"'  (1.549 m)   Wt 72.6 kg   LMP 08/16/2019   SpO2 100%   BMI 30.23 kg/m   Physical Exam Constitutional:      General: She is not in acute distress. HENT:     Head: Normocephalic and atraumatic.  Eyes:     Conjunctiva/sclera: Conjunctivae normal.     Pupils: Pupils are equal, round, and reactive to light.  Cardiovascular:     Rate and Rhythm: Normal rate and regular rhythm.  Pulmonary:     Effort: Pulmonary effort is normal. No respiratory distress.  Abdominal:     General: There is no distension.     Tenderness: There is no abdominal tenderness. There is no right CVA tenderness, guarding or rebound. Negative signs include Murphy's sign and McBurney's sign.  Skin:    General: Skin is warm and dry.  Neurological:     General: No focal deficit present.     Mental Status: She is alert. Mental status is at baseline.  Psychiatric:        Mood and Affect: Mood normal.        Behavior: Behavior normal.     ED Results / Procedures / Treatments   Labs (all labs ordered are listed, but only abnormal results are displayed) Labs Reviewed  HEPATIC FUNCTION PANEL - Abnormal; Notable for the following  components:      Result Value   AST 42 (*)    All other components within normal limits  BASIC METABOLIC PANEL  CBC WITH DIFFERENTIAL/PLATELET  LIPASE, BLOOD  I-STAT BETA HCG BLOOD, ED ([facility], [facility], [facility] ONLY)  TROPONIN I (HIGH SENSITIVITY)    EKG None  Radiology DG Chest 2 View  Result Date: 07/01/2020 CLINICAL DATA:  Chest pain and shortness of breath. EXAM: CHEST - 2 VIEW COMPARISON:  December 14, 2016 FINDINGS: The heart size and mediastinal contours are within normal limits. Both lungs are clear. The visualized  skeletal structures are unremarkable. IMPRESSION: No active cardiopulmonary disease. Electronically Signed   By: Virgina Norfolk M.D.   On: 07/01/2020 21:47    Procedures Procedures   Medications Ordered in ED Medications - No data to display  ED Course  I have reviewed the triage vital signs and the nursing notes.  Pertinent labs & imaging results that were available during my care of the patient were reviewed by me and considered in my medical decision making (see chart for details).  This patient complains of substernal episodic chest pain.  This involves an extensive number of treatment options, and is a complaint that carries with it a high risk of complications and morbidity.  The differential diagnosis includes reflux most likely (given onset after meals only) vs ACS vs muscular strain vs other  Asymptomatic in the ED.  Doubt PE - no acute risk factors aside from post-labor, had uncomplicated vaginal delivery.  No hypoxia or tachycardia here.  Also episodic nature of symptoms makes PE less likely.  No evidence of PNA per workup or xray or clinically.  Low HEART score.  Doubt this is ACS.  ECG shows NSR without acute ischemia per my interpretation.  I ordered, reviewed, and interpreted labs, which included trop 4, lipase 47, LFT normal, CBC normal. I ordered imaging studies which included dg chest I independently visualized and interpreted imaging which showed no  acute disease or PTX, and the monitor tracing which showed NSR Previous records obtained and reviewed showing recent discharge from maternity OBGYN service following uncomplicated delivery    Clinical Course as of 07/01/20 2350  Wed Jul 01, 2020  2337 Labs reviewed and unremarkable - will d/c on pepcid [MT]  2338 She still asymptomatic.  Has f/u appointment with her OBGYN [MT]    Clinical Course User Index [MT] Berkley Cronkright, Carola Rhine, MD    Final Clinical Impression(s) / ED Diagnoses Final diagnoses:  Reflux gastritis    Rx / DC Orders ED Discharge Orders         Ordered    famotidine (PEPCID) 20 MG tablet  2 times daily        07/01/20 2340           Wyvonnia Dusky, MD 07/01/20 2350

## 2020-07-14 ENCOUNTER — Other Ambulatory Visit: Payer: Self-pay

## 2020-07-14 ENCOUNTER — Other Ambulatory Visit (HOSPITAL_COMMUNITY)
Admission: RE | Admit: 2020-07-14 | Discharge: 2020-07-14 | Disposition: A | Discharge status: Home / Self Care | Payer: Medicaid Other | Source: Ambulatory Visit | Attending: Family Medicine | Admitting: Family Medicine

## 2020-07-14 ENCOUNTER — Encounter: Payer: Self-pay | Admitting: Obstetrics and Gynecology

## 2020-07-14 ENCOUNTER — Ambulatory Visit (INDEPENDENT_AMBULATORY_CARE_PROVIDER_SITE_OTHER): Payer: Medicaid Other | Admitting: Obstetrics and Gynecology

## 2020-07-14 VITALS — BP 123/78 | HR 81 | Ht 65.0 in | Wt 163.6 lb

## 2020-07-14 DIAGNOSIS — R32 Unspecified urinary incontinence: Secondary | ICD-10-CM | POA: Insufficient documentation

## 2020-07-14 DIAGNOSIS — M6208 Separation of muscle (nontraumatic), other site: Secondary | ICD-10-CM

## 2020-07-14 DIAGNOSIS — Z124 Encounter for screening for malignant neoplasm of cervix: Secondary | ICD-10-CM | POA: Diagnosis present

## 2020-07-14 DIAGNOSIS — N393 Stress incontinence (female) (male): Secondary | ICD-10-CM

## 2020-07-14 NOTE — Progress Notes (Signed)
Post Partum Visit Note  Lisa Reese is a 30 y.o. [id number] female who presents for a postpartum visit. She is 7 weeks postpartum following a normal spontaneous vaginal delivery.  I have fully reviewed the prenatal and intrapartum course. The delivery was at [redacted]w[redacted]d  gestational weeks.  Anesthesia: epidural. Postpartum course has been complicated by abdominal pain and urinary incontinence. Seen in ER two weeks ago and given Pepcid for GERD. This has improved but she does notice some midline abdominal pain intermittently.Lisa Reese is doing well. Baby is feeding by both breast and bottle - [employer] Good Start. Bleeding no bleeding. Bowel function is normal. Bladder function is incontinence. Patient is sexually active. Contraception method is OCP (estrogen/progesterone). Postpartum depression screening: negative.   The pregnancy intention screening data noted above was reviewed. Potential methods of contraception were discussed.Patient is sexually active and using condoms. She has a prescription for POP's but has not started them yet as wanted to rediscuss today.    Edinburgh Postnatal Depression Scale - 07/14/20 1528      Edinburgh Postnatal Depression Scale:  In the Past 7 Days   I have been able to laugh and see the funny side of things. 0    I have looked forward with enjoyment to things. 0    I have blamed myself unnecessarily when things went wrong. 0    I have been anxious or worried for no good reason. 0    I have felt scared or panicky for no good reason. 0    Things have been getting on top of me. 0    I have been so unhappy that I have had difficulty sleeping. 1    I have felt sad or miserable. 0    I have been so unhappy that I have been crying. 0    The thought of harming myself has occurred to me. 0    Edinburgh Postnatal Depression Scale Total 1         Having issues with urinary incontinence. Reports fulling soaking through underwear 2 to 3 times a day with coughing or when  she bends over. No issues with bowel movements. No dysuria. Endorses frequency.    The following portions of the patient's history were reviewed and updated as appropriate: allergies, current medications, past family history, past medical history, past social history, past surgical history and problem list.  Review of Systems Pertinent items are noted in HPI.    Objective:  BP 123/78   Pulse 81   Ht 5\' 5"  (1.651 m)   Wt 163 lb 9.6 oz (74.2 kg)   LMP 08/16/2019   BMI 27.22 kg/m    General:  alert, cooperative and appears stated age   Breasts:  inspection negative, no nipple discharge or bleeding, no masses or nodularity palpable  Lungs: clear to auscultation bilaterally  Heart:  regular rate and rhythm, S1, S2 normal, no murmur, click, rub or gallop  Abdomen: Abdomen is non-distended, soft, nontender to palpation. Diastasis Recti is noted as well as small, reducable umbilical hernia.    Vulva:  normal  Vagina: normal vagina  Cervix:  multiparous appearance  Corpus: not examined  Adnexa:  not evaluated  Rectal Exam: Normal rectovaginal exam        Assessment:     Postpartum exam performed. Pap smear done at today's visit.   Plan:   Essential components of care per ACOG recommendations:  1.  Mood and well being: Patient with negative depression screening today.  Reviewed local resources for support.  - Patient does not use tobacco.   yes, discussed support systems  2. Infant care and feeding:  -Patient currently breastmilk feeding? Yes    3. Sexuality, contraception and birth spacing - Patient does not want a pregnancy in the next year.  We discussed her current contraception plan. She will start taking POP's and continue using condoms.  - Discussed birth spacing of 18 months  4. Sleep and fatigue -Encouraged family/partner/community support of 4 hrs of uninterrupted sleep to help with mood and fatigue  5. Physical Recovery  - Discussed patients delivery and  complications - Patient had a second degree laceration, perineal healing reviewed. Patient expressed understanding - Patient has urinary incontinence? Yes Patient was referred to pelvic floor PT. Discussed Kegels and bladder training. Will plan to follow up in 3 months.  - Patient is safe to resume physical and sexual activity  6.  Health Maintenance - Pap smear performed today given history of LSIL.     7. Discussed umbilical hernia and diastasis recti. Provided with information materials and will plan for PT eval. Will follow up in three months.   Lisa Kudo, MD [facility], [facility]

## 2020-07-14 NOTE — Patient Instructions (Addendum)
Urinary Incontinence  Urinary incontinence refers to a condition in which a person is unable to control where and when to pass urine. A person with this condition will urinate when he or she does not mean to (involuntarily). What are the causes? This condition may be caused by:  Medicines.  Infections.  Constipation.  Overactive bladder muscles.  Weak bladder muscles.  Weak pelvic floor muscles. These muscles provide support for the bladder, intestine, and, in women, the uterus.  Enlarged prostate in men. The prostate is a gland near the bladder. When it gets too big, it can pinch the urethra. With the urethra blocked, the bladder can weaken and lose the ability to empty properly.  Surgery.  Emotional factors, such as anxiety, stress, or post-traumatic stress disorder (PTSD).  Pelvic organ prolapse. This happens in women when organs shift out of place and into the vagina. This shift can prevent the bladder and urethra from working properly. What increases the risk? The following factors may make you more likely to develop this condition:  Older age.  Obesity and physical inactivity.  Pregnancy and childbirth.  Menopause.  Diseases that affect the nerves or spinal cord (neurological diseases).  Long-term (chronic) coughing. This can increase pressure on the bladder and pelvic floor muscles. What are the signs or symptoms? Symptoms may vary depending on the type of urinary incontinence you have. They include:  A sudden urge to urinate, but passing urine involuntarily before you can get to a bathroom (urge incontinence).  Suddenly passing urine with any activity that forces urine to pass, such as coughing, laughing, exercise, or sneezing (stress incontinence).  Needing to urinate often, but urinating only a small amount, or constantly dribbling urine (overflow incontinence).  Urinating because you cannot get to the bathroom in time due to a physical disability, such as  arthritis or injury, or communication and thinking problems, such as Alzheimer disease (functional incontinence). How is this diagnosed? This condition may be diagnosed based on:  Your medical history.  A physical exam.  Tests, such as: ? Urine tests. ? X-rays of your kidney and bladder. ? Ultrasound. ? CT scan. ? Cystoscopy. In this procedure, a health care provider inserts a tube with a light and camera (cystoscope) through the urethra and into the bladder in order to check for problems. ? Urodynamic testing. These tests assess how well the bladder, urethra, and sphincter can store and release urine. There are different types of urodynamic tests, and they vary depending on what the test is measuring. To help diagnose your condition, your health care provider may recommend that you keep a log of when you urinate and how much you urinate. How is this treated? Treatment for this condition depends on the type of incontinence that you have and its cause. Treatment may include:  Lifestyle changes, such as: ? Quitting smoking. ? Maintaining a healthy weight. ? Staying active. Try to get 150 minutes of moderate-intensity exercise every week. Ask your health care provider which activities are safe for you. ? Eating a healthy diet.  Avoid high-fat foods, like fried foods.  Avoid refined carbohydrates like white bread and white rice.  Limit how much alcohol and caffeine you drink.  Increase your fiber intake. Foods such as fresh fruits, vegetables, beans, and whole grains are healthy sources of fiber.  Pelvic floor muscle exercises.  Bladder training, such as lengthening the amount of time between bathroom breaks, or using the bathroom at regular intervals.  Using techniques to suppress bladder urges.   This can include distraction techniques or controlled breathing exercises.  Medicines to relax the bladder muscles and prevent bladder spasms.  Medicines to help slow or prevent the  growth of a man's prostate.  Botox injections. These can help relax the bladder muscles.  Using pulses of electricity to help change bladder reflexes (electrical nerve stimulation).  For women, using a medical device to prevent urine leaks. This is a small, tampon-like, disposable device that is inserted into the urethra.  Injecting collagen or carbon beads (bulking agents) into the urinary sphincter. These can help thicken tissue and close the bladder opening.  Surgery. Follow these instructions at home: Lifestyle  Limit alcohol and caffeine. These can fill your bladder quickly and irritate it.  Keep yourself clean to help prevent odors and skin damage. Ask your doctor about special skin creams and cleansers that can protect the skin from urine.  Consider wearing pads or adult diapers. Make sure to change them regularly, and always change them right after experiencing incontinence. General instructions  Take over-the-counter and prescription medicines only as told by your health care provider.  Use the bathroom about every 3-4 hours, even if you do not feel the need to urinate. Try to empty your bladder completely every time. After urinating, wait a minute. Then try to urinate again.  Make sure you are in a relaxed position while urinating.  If your incontinence is caused by nerve problems, keep a log of the medicines you take and the times you go to the bathroom.  Keep all follow-up visits as told by your health care provider. This is important. Contact a health care provider if:  You have pain that gets worse.  Your incontinence gets worse. Get help right away if:  You have a fever or chills.  You are unable to urinate.  You have redness in your groin area or down your legs. Summary  Urinary incontinence refers to a condition in which a person is unable to control where and when to pass urine.  This condition may be caused by medicines, infection, weak bladder  muscles, weak pelvic floor muscles, enlargement of the prostate (in men), or surgery.  The following factors increase your risk for developing this condition: older age, obesity, pregnancy and childbirth, menopause, neurological diseases, and chronic coughing.  There are several types of urinary incontinence. They include urge incontinence, stress incontinence, overflow incontinence, and functional incontinence.  This condition is usually treated first with lifestyle and behavioral changes, such as quitting smoking, eating a healthier diet, and doing regular pelvic floor exercises. Other treatment options include medicines, bulking agents, medical devices, electrical nerve stimulation, or surgery. This information is not intended to replace advice given to you by your health care provider. Make sure you discuss any questions you have with your health care provider. Document Revised: 04/14/2017 Document Reviewed: 07/14/2016 Elsevier Patient Education  2021 [employer] Inc.  Diastasis Recti  Diastasis recti is a condition in which the muscles of the abdomen (rectus abdominis muscles) become thin and separate. The result is a wider space between the muscles of the right and left abdomen (abdominal muscles). This wider space between the muscles may cause a bulge in the middle of the abdomen. This bulge may be noticed when a person is straining or when he or she sits up after lying down. Diastasis recti can affect men and women. It is most common among pregnant women, babies, people with obesity, and people who have had abdominal surgery. Exercise or surgery may  help correct this condition. What are the causes? Common causes of this condition include:  Pregnancy. As the uterus grows in size, it puts pressure on the abdominal muscles, causing the muscles to separate.  Obesity. Excess fat puts pressure on abdominal muscles.  Weight lifting.  Some exercises of the abdomen.  Advanced  age.  Genetics.  Having had surgery on the abdomen before. What increases the risk? This condition is more likely to develop in:  Women.  Newborns, especially newborns who are born early (prematurely). What are the signs or symptoms? Common symptoms of this condition include:  A bulge in the middle of your abdomen. You will notice it most when you sit up or strain.  Pain in your low back, hips, or the area between your hip bones (pelvis).  Constipation.  Being unable to control when you urinate (urinary incontinence).  Bloating.  Poor posture. How is this diagnosed? This condition is diagnosed with a physical exam. During the exam, your health care provider will ask you to lie flat on your back and do a crunch or half sit-up. If you have diastasis recti, a bulge will appear lengthwise between your abdominal muscles in the center of your abdomen. Your health care provider will measure the gap between your muscles with one of the following:  A medical device used to measure the space between two objects (caliper).  A tape measure.  CT scan.  Ultrasound.  Finger spaces. Your health care provider will measure the space using his or her fingers. How is this treated? If your muscle separation is not too large, you may not need treatment. However, if you are a woman who plans to become pregnant again, you should treat this condition before your next pregnancy. Treatment may include:  Physical therapy exercises to strengthen and tighten your abdominal muscles.  Lifestyle changes such as weight loss and exercise.  Over-the-counter pain medicines as needed.  Surgery to correct the separation. Follow these instructions at home: Activity  Return to your normal activities as told by your health care provider. Ask your health care provider what activities are safe for you.  Do exercises as told by your health care provider. Make sure you are doing your exercises and movements  correctly when lifting weights or doing exercises using your abdominal muscles or the muscles in the center of your body that give stability (core muscles). Proper form can help to prevent this condition from happening again. General instructions  If you are overweight, ask your health care provider for help with weight loss. Losing even a small amount of weight can help to improve your diastasis recti.  Take over-the-counter or prescription medicines only as told by your health care provider.  Do not strain. Straining can make the separation worse. Examples of straining include: ? Pushing hard to have a bowel movement, such as when you have constipation. ? Lifting heavy objects or lifting children. ? Standing up and sitting down.  You may need to take these actions to prevent or treat constipation: ? Drink enough fluid to keep your urine pale yellow. ? Take over-the-counter or prescription medicines. ? Eat foods that are high in fiber, such as beans, whole grains, and fresh fruits and vegetables. ? Limit foods that are high in fat and processed sugars, such as fried or sweet foods.  Keep all follow-up visits. This is important. Contact a health care provider if:  You notice a new bulge in your abdomen. Get help right away if:  You experience severe discomfort in your abdomen.  You develop severe abdominal pain along with nausea, vomiting, or a fever. Summary  Diastasis recti is a condition in which the muscles of the abdomen (rectus abdominismuscles) become thin and separate. You may notice a bulge in your abdomen because the space has widened between the muscles of the right and left abdomen.  The most common symptom is a bulge in the middle of your abdomen. You will notice it most when you sit up or strain.  This condition is diagnosed with a physical exam.  If the muscle separation is not too big, you may not need treatment. Otherwise, you may need to do physical therapy or  have surgery. This information is not intended to replace advice given to you by your health care provider. Make sure you discuss any questions you have with your health care provider. Document Revised: 12/06/2019 Document Reviewed: 12/06/2019 Elsevier Patient Education  2021 Elsevier Inc.  Umbilical Hernia, Adult  A hernia is a bulge of tissue that pushes through an opening between muscles. An umbilical hernia happens in the abdomen, near the belly button (umbilicus). The hernia may contain tissues from the small intestine, large intestine, or fatty tissue covering the intestines (omentum). Umbilical hernias in adults tend to get worse over time, and they require surgical treatment. There are several types of umbilical hernias. You may have:  A hernia located just above or below the umbilicus (indirect hernia). This is the most common type of umbilical hernia in adults.  A hernia that forms through an opening formed by the umbilicus (direct hernia).  A hernia that comes and goes (reducible hernia). A reducible hernia may be visible only when you strain, lift something heavy, or cough. This type of hernia can be pushed back into the abdomen (reduced).  A hernia that traps abdominal tissue inside the hernia (incarcerated hernia). This type of hernia cannot be reduced.  A hernia that cuts off blood flow to the tissues inside the hernia (strangulated hernia). The tissues can start to die if this happens. This type of hernia requires emergency treatment. What are the causes? An umbilical hernia happens when tissue inside the abdomen presses on a weak area of the abdominal muscles. What increases the risk? You may have a greater risk of this condition if you:  Are obese.  Have had several pregnancies.  Have a buildup of fluid inside your abdomen (ascites).  Have had surgery that weakens the abdominal muscles. What are the signs or symptoms? The main symptom of this condition is a painless  bulge at or near the belly button. A reducible hernia may be visible only when you strain, lift something heavy, or cough. Other symptoms may include:  Dull pain.  A feeling of pressure. Symptoms of a strangulated hernia may include:  Pain that gets increasingly worse.  Nausea and vomiting.  Pain when pressing on the hernia.  Skin over the hernia becoming red or purple.  Constipation.  Blood in the stool. How is this diagnosed? This condition may be diagnosed based on:  A physical exam. You may be asked to cough or strain while standing. These actions increase the pressure inside your abdomen and force the hernia through the opening in your muscles. Your health care provider may try to reduce the hernia by pressing on it.  Your symptoms and medical history. How is this treated? Surgery is the only treatment for an umbilical hernia. Surgery for a strangulated hernia is done as  soon as possible. If you have a small hernia that is not incarcerated, you may need to lose weight before having surgery. Follow these instructions at home:  Lose weight, if told by your health care provider.  Do not try to push the hernia back in.  Watch your hernia for any changes in color or size. Tell your health care provider if any changes occur.  You may need to avoid activities that increase pressure on your hernia.  Do not lift anything that is heavier than 10 lb (4.5 kg) until your health care provider says that this is safe.  Take over-the-counter and prescription medicines only as told by your health care provider.  Keep all follow-up visits as told by your health care provider. This is important. Contact a health care provider if:  Your hernia gets larger.  Your hernia becomes painful. Get help right away if:  You develop sudden, severe pain near the area of your hernia.  You have pain as well as nausea or vomiting.  You have pain and the skin over your hernia changes  color.  You develop a fever. This information is not intended to replace advice given to you by your health care provider. Make sure you discuss any questions you have with your health care provider. Document Revised: 05/17/2017 Document Reviewed: 10/03/2016 Elsevier Patient Education  2021 [employer].

## 2020-07-15 ENCOUNTER — Encounter: Payer: Self-pay | Admitting: General Practice

## 2020-07-15 LAB — CYTOLOGY - PAP: Diagnosis: NEGATIVE

## 2020-09-08 ENCOUNTER — Ambulatory Visit: Payer: Medicaid Other | Attending: Obstetrics and Gynecology | Admitting: Physical Therapy

## 2020-09-08 ENCOUNTER — Encounter: Payer: Self-pay | Admitting: Physical Therapy

## 2020-09-08 ENCOUNTER — Other Ambulatory Visit: Payer: Self-pay

## 2020-09-08 DIAGNOSIS — R252 Cramp and spasm: Secondary | ICD-10-CM | POA: Insufficient documentation

## 2020-09-08 DIAGNOSIS — M6281 Muscle weakness (generalized): Secondary | ICD-10-CM | POA: Diagnosis not present

## 2020-09-08 NOTE — Therapy (Signed)
[facility] [address] [place], [place], [address] Phone: [phone]   Fax:  [phone]  Physical Therapy Evaluation  Patient Details  Name: Lisa Reese MRN: [id number] Date of Birth: [birth year] Referring Provider (PT): Gita Kudo, MD   Encounter Date: 09/08/2020   PT End of Session - 09/08/20 1710    Visit Number 1    Date for PT Re-Evaluation 12/01/20    Authorization Type healthy blue - put aut in for 12 visits until 8/16    Authorization - Visit Number 0    PT Start Time 1534    PT Stop Time 1614    PT Time Calculation (min) 40 min    Activity Tolerance Patient tolerated treatment well    Behavior During Therapy [facility] for tasks assessed/performed           Past Medical History:  Diagnosis Date  . Hyperemesis gravidarum   . Vaginal Pap smear, abnormal     Past Surgical History:  Procedure Laterality Date  . NO PAST SURGERIES      There were no vitals filed for this visit.    Subjective Assessment - 09/08/20 1531    Subjective Pt states she has leakage when bending, lifting, sneezing.  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  Passing gas or sneezing leakage is enough to wet the bed or the floor.  I try not to drink as much water so I drink 3/day.  I have pain with intercourse because I feel like I have to pee    Limitations Lifting    Patient Stated Goals be able to not have leakage    Currently in Pain? 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      Cognition   Overall Cognitive Status Within Functional Limits for tasks assessed      Posture/Postural Control   Posture/Postural Control Postural limitations    Postural Limitations Anterior pelvic tilt;Increased lumbar lordosis      ROM / Strength   AROM / PROM / Strength AROM;PROM;Strength      AROM   Overall AROM Comments lumbar ext 25% limited +pain; fleixon 10% limited      PROM   Overall PROM Comments hip ER 50%; IR 60%      Strength   Overall Strength Comments hip adduciton 4/5      Flexibility   Soft Tissue Assessment /Muscle Length yes    Hamstrings 90 deg +pain bilateral - has to clench trying not to pee (just went to bathroom      Palpation   Palpation comment tight adductors bil Rt>Lt; tight lumbar paraspinals Rt>Lt      Ambulation/Gait   Gait Pattern Trunk flexed  Objective measurements completed on examination: See above findings.     Pelvic Floor Special Questions - 09/08/20 0001    Prior Pelvic/Prostate Exam Yes    Are you Pregnant or attempting pregnancy? No    Prior Pregnancies Yes    Number of Pregnancies 2    Number of Vaginal Deliveries 2    Any difficulty with labor and deliveries --   2nd degree tear   Diastasis Recti 3 fingers wide and 1+ knuckle deep - did not palpate depth fully due to pain    Currently Sexually Active Yes    Is this Painful Yes    Marinoff Scale pain interrupts completion    Urinary Leakage Yes    How often throughout the day    Pad use 3 at least    Activities that cause leaking Sneezing;Lifting;Bending;Intercourse    Urinary frequency almost always have to go - can wait longer home but I have to go every 15-20 minutes    Fecal incontinence No    Fluid intake 3x bottle H2O    Falling out feeling (prolapse) No    Skin Integrity Intact     Scar Well healed;Tear    Perineal Body/Introitus  Descended;Asymmetric    External Palpation TTP and tight bilateral Rt>Lt    Pelvic Floor Internal Exam pt identity confirmed and informed consent to assess pelvic floor    Exam Type Vaginal    Palpation Rt levators thin and wasted compared to Lt side    Strength weak squeeze, no lift    Strength # of seconds 3    Tone high Lt; weakness Rt                         PT Long Term Goals - 09/08/20 1549      PT LONG TERM GOAL #1   Title Pt will feel comfortable at work without feel like she has worry about leaking    Baseline goes to bathroom frequently and clenching all of the time    Time 12    Period Weeks    Status New    Target Date 12/01/20      PT LONG TERM GOAL #2   Title Pt will have at most a couple drips of leakage during activities that can cause leakage such as sneezing and bending.    Baseline enough to wet through clothes and 3 pads or more per day    Time 12    Period Weeks    Status New    Target Date 12/01/20      PT LONG TERM GOAL #3   Title Pt will be able to use 1 pad per day at most    Baseline 3 or more    Time 12    Period Weeks    Status New    Target Date 12/01/20      PT LONG TERM GOAL #4   Title Pt will be ind with advanced HEP to maintain core strength for functional activities    Time 12    Period Weeks    Status New    Target Date 12/01/20      PT LONG TERM GOAL #5   Title Pt will demonstrate diastasis of rectus reduced to <2 fingers and no depth or tenderness    Time 12    Period Weeks    Status New    Target Date 12/01/20  Plan - 09/08/20 1711    Clinical Impression Statement Pt presents to clinic due to stress inconinence since vaginal delivery of her seconds child on February 8,2022.  Pt is having frequency of mictrition every 10-15 minutes.  She reduces this time by limiting fluid intake when she is at work.  Pt has decreased lumbar ext AROM and  pain with extension, tight lumbar extensors.  Pt has diastasis RA 3 fingers and TTP around umbilicus; Posture abnormalities as mentioned above.  She has hip weakness adduction and pelvic floor weakness with tearing of levators on Rt side.  2/5 MMT 3 sec hold.  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    Personal Factors and Comorbidities Comorbidity 2;Profession    Comorbidities tearing during delivery, history of back pain    Examination-Activity Limitations Continence;Lift;Squat;Caring for Others;Carry    Examination-Participation Restrictions Community Activity;Interpersonal Relationship;Occupation    Stability/Clinical Decision Making Evolving/Moderate complexity    Clinical Decision Making Moderate    Rehab Potential Good    PT Frequency 1x / week    PT Duration 12 weeks    PT Treatment/Interventions ADLs/Self Care Home Management;Biofeedback;Cryotherapy;Electrical Stimulation;Moist Heat;Therapeutic activities;Therapeutic exercise;Neuromuscular re-education;Manual techniques;Patient/family education;Taping;Dry needling;Passive range of motion    PT Next Visit Plan stretch  adductros, piriformis, h/s; transverse abd and kegel - basic education; urge techniques    Consulted and Agree with Plan of Care Patient           Patient will benefit from skilled therapeutic intervention in order to improve the following deficits and impairments:  Decreased strength,Postural dysfunction,Decreased range of motion,Decreased coordination,Decreased endurance,Increased muscle spasms,Increased fascial restricitons,Other (comment) (muscle tear injury during delivery)  Visit Diagnosis: Muscle weakness (generalized)  Cramp and spasm     Problem List Patient Active Problem List   Diagnosis Date Noted  . Diastasis recti 07/14/2020  . Urinary incontinence 07/14/2020  . SVD (spontaneous vaginal delivery) 05/26/2020   . Post-dates pregnancy 05/25/2020  . Anemia affecting pregnancy 01/30/2020  . Gastric reflux 01/30/2020  . Umbilical hernia without obstruction and without gangrene 01/02/2020  . Supervision of low-risk pregnancy, second trimester 12/05/2019  . Back pain affecting pregnancy 10/04/2019  . Low grade squamous intraepith lesion on cytologic smear cervix (lgsil) 10/12/2016    Junious Silk, PT 09/08/2020, 5:36 PM  [facility] [address], [address] [place], [place], [address] Phone: [phone]   Fax:  [phone]  Name: Lisa Reese MRN: [id number] Date of Birth: Feb 01, 1991

## 2020-09-15 ENCOUNTER — Other Ambulatory Visit: Payer: Self-pay

## 2020-09-15 ENCOUNTER — Ambulatory Visit: Payer: Medicaid Other | Admitting: Physical Therapy

## 2020-09-15 DIAGNOSIS — M6281 Muscle weakness (generalized): Secondary | ICD-10-CM | POA: Diagnosis not present

## 2020-09-15 DIAGNOSIS — R252 Cramp and spasm: Secondary | ICD-10-CM

## 2020-09-15 NOTE — Patient Instructions (Signed)
Access Code: [email or web address] URL: [email or web address] Date: 09/15/2020 Prepared by: Dwana Curd  Exercises Supine Hamstring Stretch with Strap - 1 x daily - 7 x weekly - 1 sets - 3 reps - 30 sec hold Supine Butterfly Groin Stretch - 1 x daily - 7 x weekly - 1 sets - 3 reps - 30 sec hold Supine Lower Trunk Rotation - 1 x daily - 7 x weekly - 1 sets - 10 reps - 5 sec hold Supine Pelvic Floor Stretch - 1 x daily - 7 x weekly - 1 sets - 3 reps - 30 sec hold Prone Quadriceps Stretch with Strap - 1 x daily - 7 x weekly - 1 sets - 3 reps - 30sec hold Prone Pelvic Floor Contraction with Heel Squeeze - 1 x daily - 7 x weekly - 3 sets - 10 reps

## 2020-09-15 NOTE — Therapy (Addendum)
[facility] [address], [place], [place], [address] Phone: [phone]   Fax:  [phone]  Physical Therapy Treatment  Patient Details  Name: Lisa Reese MRN: [id number] Date of Birth: [birth year] Referring Provider (PT): Janet Berlin, MD   Encounter Date: 09/15/2020   PT End of Session - 09/15/20 1706    Visit Number 2    Date for PT Re-Evaluation 12/01/20    Authorization Type healthy blue - put aut in for 12 visits until 8/16    Authorization - Visit Number 1    Authorization - Number of Visits 12    PT Start Time 1607    PT Stop Time 1654    PT Time Calculation (min) 37 min    Activity Tolerance Patient tolerated treatment well    Behavior During Therapy [facility] for tasks assessed/performed           Past Medical History:  Diagnosis Date  . Hyperemesis gravidarum   . Vaginal Pap smear, abnormal     Past Surgical History:  Procedure Laterality Date  . NO PAST SURGERIES      There were no vitals filed for this visit.   Subjective Assessment - 09/15/20 1706    Subjective No changes to report    Patient Stated Goals be able to not have leakage    Currently in Pain? No/denies                             [facility] Adult PT Treatment/Exercise - 09/15/20 0001      Neuro Re-ed    Neuro Re-ed Details  urge drills- educated and performed      Exercises   Exercises Lumbar      Lumbar Exercises: Stretches   Active Hamstring Stretch Right;Left;2 reps;20 seconds    Double Knee to Chest Stretch 3 reps;60 seconds   happy baby   Lower Trunk Rotation 5 reps;10 seconds    Quad Stretch Right;Left;1 rep;60 seconds    Figure 4 Stretch 2 reps;30 seconds      Lumbar Exercises: Supine   Other Supine Lumbar Exercises bent knee fallout with ab set      Lumbar Exercises: Sidelying   Other Sidelying Lumbar Exercises kegel with heel press                  PT Education - 09/15/20  1712    Education Details Access Code: [email or web address]; urge drill    Person(s) Educated Patient    Methods Explanation;Demonstration;Tactile cues;Verbal cues;Handout    Comprehension Verbalized understanding;Returned demonstration               PT Long Term Goals - 09/08/20 1549      PT LONG TERM GOAL #1   Title Pt will feel comfortable at work without feel like she has worry about leaking    Baseline goes to bathroom frequently and clenching all of the time    Time 12    Period Weeks    Status New    Target Date 12/01/20      PT LONG TERM GOAL #2   Title Pt will have at most a couple drips of leakage during activities that can cause leakage such as sneezing and bending.    Baseline enough to wet through clothes and 3 pads or more per day    Time 12    Period Weeks    Status New  Target Date 12/01/20      PT LONG TERM GOAL #3   Title Pt will be able to use 1 pad per day at most    Baseline 3 or more    Time 12    Period Weeks    Status New    Target Date 12/01/20      PT LONG TERM GOAL #4   Title Pt will be ind with advanced HEP to maintain core strength for functional activities    Time 12    Period Weeks    Status New    Target Date 12/01/20      PT LONG TERM GOAL #5   Title Pt will demonstrate diastasis of rectus reduced to <2 fingers and no depth or tenderness    Time 12    Period Weeks    Status New    Target Date 12/01/20                 Plan - 09/15/20 1707    Clinical Impression Statement Pt able to correctly do stretches and exercises for intitial HEP.  Pt needs a lot of TC to do kegel and transverse ab ex's correctly and cues to no hold breath.  Pt was educated on urge to void and how to retrain her bladder for less urgency.  Pt will benefit from skilled PT, no goals met due to today being initial treatment since evaluation and first time getting HEP and treatment.    PT Treatment/Interventions ADLs/Self Care Home  Management;Biofeedback;Cryotherapy;Electrical Stimulation;Moist Heat;Therapeutic activities;Therapeutic exercise;Neuromuscular re-education;Manual techniques;Patient/family education;Taping;Dry needling;Passive range of motion    PT Next Visit Plan progress kegels and core strength, follow up on urge drill and initial HEP    PT Home Exercise Plan Access Code: [email or web address]    Consulted and Agree with Plan of Care Patient           Patient will benefit from skilled therapeutic intervention in order to improve the following deficits and impairments:  Decreased strength,Postural dysfunction,Decreased range of motion,Decreased coordination,Decreased endurance,Increased muscle spasms,Increased fascial restricitons,Other (comment)  Visit Diagnosis: Muscle weakness (generalized)  Cramp and spasm     Problem List Patient Active Problem List   Diagnosis Date Noted  . Diastasis recti 07/14/2020  . Urinary incontinence 07/14/2020  . SVD (spontaneous vaginal delivery) 05/26/2020  . Post-dates pregnancy 05/25/2020  . Anemia affecting pregnancy 01/30/2020  . Gastric reflux 01/30/2020  . Umbilical hernia without obstruction and without gangrene 01/02/2020  . Supervision of low-risk pregnancy, second trimester 12/05/2019  . Back pain affecting pregnancy 10/04/2019  . Low grade squamous intraepith lesion on cytologic smear cervix (lgsil) 10/12/2016    Lisa Reese, PT 09/15/2020, 5:19 PM  [facility] [address], [address] [place], [place], [address] Phone: [phone]   Fax:  [phone]  Name: Lisa Reese MRN: [id number] Date of Birth: 07/08/1990   

## 2020-09-23 ENCOUNTER — Encounter: Payer: Medicaid Other | Admitting: Physical Therapy

## 2020-09-30 ENCOUNTER — Other Ambulatory Visit: Payer: Self-pay

## 2020-09-30 ENCOUNTER — Encounter: Payer: Self-pay | Admitting: Physical Therapy

## 2020-09-30 ENCOUNTER — Ambulatory Visit: Payer: Medicaid Other | Attending: Obstetrics and Gynecology | Admitting: Physical Therapy

## 2020-09-30 DIAGNOSIS — R252 Cramp and spasm: Secondary | ICD-10-CM | POA: Diagnosis present

## 2020-09-30 DIAGNOSIS — M6281 Muscle weakness (generalized): Secondary | ICD-10-CM | POA: Insufficient documentation

## 2020-09-30 NOTE — Therapy (Signed)
[facility] [address], [address] [place], [place], [address] Phone: [phone]   Fax:  [phone]  Physical Therapy Treatment  Patient Details  Name: Sheri Gatchel MRN: [id number] Date of Birth: [birth year] Referring Provider (PT): Gita Kudo, MD   Encounter Date: 09/30/2020   PT End of Session - 09/30/20 1412     Visit Number 3    Date for PT Re-Evaluation 12/01/20    Authorization Type healthy blue - put aut in for 12 visits until 8/16    Authorization - Visit Number 2    Authorization - Number of Visits 12    PT Start Time 1410   arrived late   PT Stop Time 1440    PT Time Calculation (min) 30 min    Activity Tolerance Patient tolerated treatment well    Behavior During Therapy [facility] for tasks assessed/performed             Past Medical History:  Diagnosis Date   Hyperemesis gravidarum    Vaginal Pap smear, abnormal     Past Surgical History:  Procedure Laterality Date   NO PAST SURGERIES      There were no vitals filed for this visit.   Subjective Assessment - 09/30/20 1714     Subjective Feels a little better.    Patient Stated Goals be able to not have leakage    Currently in Pain? No/denies                               [facility] Adult PT Treatment/Exercise - 09/30/20 0001       Neuro Re-ed    Neuro Re-ed Details  sitting on ball - pelvic circle, kegel, march with kegel      Lumbar Exercises: Stretches   Active Hamstring Stretch Right;Left;2 reps;20 seconds    Hip Flexor Stretch Right;Left;3 reps;20 seconds    Other Lumbar Stretch Exercise hip rotation - 20sec      Lumbar Exercises: Standing   Functional Squats 20 reps   kegel and rest breaks as needed                        PT Long Term Goals - 09/30/20 1414       PT LONG TERM GOAL #1   Title Pt will feel comfortable at work without feel like she has worry about leaking    Baseline still  thinking about it a lot but it is better    Status On-going      PT LONG TERM GOAL #2   Title Pt will have at most a couple drips of leakage during activities that can cause leakage such as sneezing and bending.    Baseline it is better at work because I am able to go to the bathroom more; at home very slight but can notice some change      PT LONG TERM GOAL #3   Title Pt will be able to use 1 pad per day at most    Baseline 1-2/day    Status On-going      PT LONG TERM GOAL #4   Title Pt will be ind with advanced HEP to maintain core strength for functional activities    Status On-going      PT LONG TERM GOAL #5   Title Pt will demonstrate diastasis of rectus reduced to <2 fingers and no depth or  tenderness    Status On-going                   Plan - 09/30/20 1717     Clinical Impression Statement Pt did well with exercises and has made some progress.  She was able to correctly perform squat techniques with kegel exercise.  Pt had shorter session due to arriving late today.  She was also educated in doing exercises using kegel weights for tactile feedback.  Pt will benefit from skilled PT to continue to progress strength for greater support for bending and squatting.    Examination-Participation Restrictions Community Activity;Interpersonal Relationship;Occupation    PT Treatment/Interventions ADLs/Self Care Home Management;Biofeedback;Cryotherapy;Electrical Stimulation;Moist Heat;Therapeutic activities;Therapeutic exercise;Neuromuscular re-education;Manual techniques;Patient/family education;Taping;Dry needling;Passive range of motion    PT Next Visit Plan f/u on mini squats, do hip hinge and single leg    PT Home Exercise Plan Access Code: [email or web address]    Consulted and Agree with Plan of Care Patient             Patient will benefit from skilled therapeutic intervention in order to improve the following deficits and impairments:  Decreased strength, Postural dysfunction,  Decreased range of motion, Decreased coordination, Decreased endurance, Increased muscle spasms, Increased fascial restricitons, Other (comment)  Visit Diagnosis: Muscle weakness (generalized)  Cramp and spasm     Problem List Patient Active Problem List   Diagnosis Date Noted   Diastasis recti 07/14/2020   Urinary incontinence 07/14/2020   SVD (spontaneous vaginal delivery) 05/26/2020   Post-dates pregnancy 05/25/2020   Anemia affecting pregnancy 01/30/2020   Gastric reflux 01/30/2020   Umbilical hernia without obstruction and without gangrene 01/02/2020   Supervision of low-risk pregnancy, second trimester 12/05/2019   Back pain affecting pregnancy 10/04/2019   Low grade squamous intraepith lesion on cytologic smear cervix (lgsil) 10/12/2016    Junious Silk, PT 09/30/2020, 5:24 PM  [facility] [address] [place], [place], [address] Phone: [phone]   Fax:  [phone]  Name: Tovah Slavick MRN: [id number] Date of Birth: [birth year]

## 2020-10-05 ENCOUNTER — Ambulatory Visit: Payer: Medicaid Other | Admitting: Physical Therapy

## 2020-10-05 ENCOUNTER — Other Ambulatory Visit: Payer: Self-pay

## 2020-10-05 DIAGNOSIS — M6281 Muscle weakness (generalized): Secondary | ICD-10-CM | POA: Diagnosis not present

## 2020-10-05 DIAGNOSIS — R252 Cramp and spasm: Secondary | ICD-10-CM

## 2020-10-05 NOTE — Patient Instructions (Addendum)
0STRETCHING THE PELVIC FLOOR MUSCLES NO DILATOR  Supplies Vaginal lubricant Mirror (optional) Gloves (optional) or clean hands Positioning Start in a semi-reclined position with your head propped up. Bend your knees and place your thumb or finger at the vaginal opening. Procedure Apply a moderate amount of lubricant on the outer skin of your vagina, the labia minora.  Apply additional lubricant to your finger. Spread the skin away from the vaginal opening. Place the end of your finger at the opening. Do a maximum contraction of the pelvic floor muscles. Tighten the vagina and the anus maximally and relax. When you know they are relaxed, gently and slowly insert your finger into your vagina, directing your finger slightly downward, for 2-3 inches of insertion. Relax and stretch the 6 o'clock position Hold each stretch for _30-60 seconds, no pain more than 3/10 Repeat the stretching in the 4 o'clock and 8 o'clock positions. Next gently move your finger in a "U" shape  several times.  You can also enter a second finger to work to spread the vaginal opening wider from 3:00-6:00 and 6:00-9:00 or 3:00-9:00 Perform daily or every other day Once you have accomplished the techniques you may try them in standing with one foot resting on the tub, or in other positions.  This is a good stretch to do in the shower if you don't need to use lubricant.    Do quick flicks - quick kegel and relax all the way, repeat until the strength weakens (start with sets of 3)  Russella Dar, PT 10/05/20 4:52 PM

## 2020-10-05 NOTE — Therapy (Signed)
[facility] [address], [address] [place], [place], [address] Phone: [phone]   Fax:  [phone]  Physical Therapy Treatment  Patient Details  Name: Lisa Reese MRN: [id number] Date of Birth: 15-Dec-1990 Referring Provider (PT): Gita Kudo, MD   Encounter Date: 10/05/2020   PT End of Session - 10/05/20 1703     Visit Number 4    Date for PT Re-Evaluation 12/01/20    Authorization Type healthy blue - put aut in for 12 visits until 8/16    Authorization - Visit Number 3    Authorization - Number of Visits 12    PT Start Time 1616    PT Stop Time 1654    PT Time Calculation (min) 38 min    Activity Tolerance Patient tolerated treatment well    Behavior During Therapy [facility] for tasks assessed/performed             Past Medical History:  Diagnosis Date   Hyperemesis gravidarum    Vaginal Pap smear, abnormal     Past Surgical History:  Procedure Laterality Date   NO PAST SURGERIES      There were no vitals filed for this visit.                   Pelvic Floor Special Questions - 10/05/20 0001     Pelvic Floor Internal Exam pt identity confirmed and informed consent to assess pelvic floor    Exam Type Vaginal    Palpation Rt levator tear; Lt tight and TTP    Strength weak squeeze, no lift    Strength # of reps --   3 quick with fatigue   Strength # of seconds 3               [facility] Adult PT Treatment/Exercise - 10/05/20 0001       Self-Care   Self-Care Other Self-Care Comments    Other Self-Care Comments  manual stretch and massage to pelvic floor with demo and pt using pic of pelvic floor      Neuro Re-ed    Neuro Re-ed Details  sitting on ball - pelvic circle, kegel, march with kegel      Lumbar Exercises: Supine   AB Set Limitations kegel and quick flicks with tactile cues      Manual Therapy   Manual Therapy Internal Pelvic Floor    Manual therapy comments pt  identity confirmed and internal assessed and treated with informed consent    Internal Pelvic Floor bil levators and scar tissue stretch and cross fiber friction                    PT Education - 10/05/20 1658     Education Details self massage and quick flicks    Person(s) Educated Patient    Methods Explanation;Demonstration;Tactile cues;Verbal cues;Handout    Comprehension Verbalized understanding;Returned demonstration                 PT Long Term Goals - 10/05/20 1720       PT LONG TERM GOAL #1   Title Pt will feel comfortable at work without feel like she has worry about leaking    Status On-going      PT LONG TERM GOAL #2   Title Pt will have at most a couple drips of leakage during activities that can cause leakage such as sneezing and bending.    Status On-going  PT LONG TERM GOAL #3   Title Pt will be able to use 1 pad per day at most    Baseline 1-2/day    Status On-going      PT LONG TERM GOAL #4   Title Pt will be ind with advanced HEP to maintain core strength for functional activities    Status On-going      PT LONG TERM GOAL #5   Title Pt will demonstrate diastasis of rectus reduced to <2 fingers and no depth or tenderness    Status On-going                   Plan - 10/05/20 1704     Clinical Impression Statement Pt was re-assessed today due to her improved ability to control her bladder symptoms overall, but still having a hard time with endurance and she is clenching all day feeling like she could leak any time.  Pt had significant tearing and levator tear on the Rt side. Lt pelvic floor is very tight and TTP but released with STM.  She is able to hold for 2-3 seconds and can do 3 quick flicks before she fatigues and the contraction is diminished.  Pt also educated in how to release pelvic floor muscles manually to improve function.  Pt continues to benefit from skilled PT to address muscle coordination and endurance.    PT  Treatment/Interventions ADLs/Self Care Home Management;Biofeedback;Cryotherapy;Electrical Stimulation;Moist Heat;Therapeutic activities;Therapeutic exercise;Neuromuscular re-education;Manual techniques;Patient/family education;Taping;Dry needling;Passive range of motion    PT Next Visit Plan f/u on stretch pelvic floor and quick flicks and kegel biofeedback, assess diastasis    PT Home Exercise Plan Access Code: [email or web address]    Consulted and Agree with Plan of Care Patient             Patient will benefit from skilled therapeutic intervention in order to improve the following deficits and impairments:  Decreased strength, Postural dysfunction, Decreased range of motion, Decreased coordination, Decreased endurance, Increased muscle spasms, Increased fascial restricitons, Other (comment)  Visit Diagnosis: Muscle weakness (generalized)  Cramp and spasm     Problem List Patient Active Problem List   Diagnosis Date Noted   Diastasis recti 07/14/2020   Urinary incontinence 07/14/2020   SVD (spontaneous vaginal delivery) 05/26/2020   Post-dates pregnancy 05/25/2020   Anemia affecting pregnancy 01/30/2020   Gastric reflux 01/30/2020   Umbilical hernia without obstruction and without gangrene 01/02/2020   Supervision of low-risk pregnancy, second trimester 12/05/2019   Back pain affecting pregnancy 10/04/2019   Low grade squamous intraepith lesion on cytologic smear cervix (lgsil) 10/12/2016    Junious Silk, PT 10/05/2020, 5:30 PM  [facility] [address], [address] [place], [place], [address] Phone: [phone]   Fax:  [phone]  Name: Tarren Sabree MRN: [id number] Date of Birth: Jan 10, 1991

## 2020-10-15 ENCOUNTER — Other Ambulatory Visit: Payer: Self-pay

## 2020-10-15 ENCOUNTER — Ambulatory Visit: Payer: Medicaid Other | Admitting: Physical Therapy

## 2020-10-15 ENCOUNTER — Encounter: Payer: Self-pay | Admitting: Physical Therapy

## 2020-10-15 DIAGNOSIS — R252 Cramp and spasm: Secondary | ICD-10-CM

## 2020-10-15 DIAGNOSIS — M6281 Muscle weakness (generalized): Secondary | ICD-10-CM | POA: Diagnosis not present

## 2020-10-15 NOTE — Therapy (Signed)
[facility] [address], [address] [place], [place], [address] Phone: [phone]   Fax:  [phone]  Physical Therapy Treatment  Patient Details  Name: Minerva Bluett MRN: [id number] Date of Birth: 1990-11-03 Referring Provider (PT): Gita Kudo, MD   Encounter Date: 10/15/2020   PT End of Session - 10/15/20 1453     Visit Number 5    Date for PT Re-Evaluation 12/01/20    Authorization Type healthy blue - put aut in for 12 visits until 8/16    Authorization - Visit Number 4    Authorization - Number of Visits 12    PT Start Time 1450    PT Stop Time 1530    PT Time Calculation (min) 40 min    Activity Tolerance Patient tolerated treatment well    Behavior During Therapy [facility] for tasks assessed/performed             Past Medical History:  Diagnosis Date   Hyperemesis gravidarum    Vaginal Pap smear, abnormal     Past Surgical History:  Procedure Laterality Date   NO PAST SURGERIES      There were no vitals filed for this visit.   Subjective Assessment - 10/15/20 1508     Subjective I had one accident at work but other than that I think it is a little better    Patient Stated Goals be able to not have leakage    Currently in Pain? No/denies                               [facility] Adult PT Treatment/Exercise - 10/15/20 0001       Neuro Re-ed    Neuro Re-ed Details  transverse ab with S sound      Lumbar Exercises: Supine   Other Supine Lumbar Exercises ball overhead, rollout, bent knee raise, bend knee fall out - 15 x all TC and VC to perform correctly      Manual Therapy   Manual Therapy Taping    Kinesiotex Facilitate Muscle      Kinesiotix   Facilitate Muscle  star to correct DRA                    PT Education - 10/15/20 1729     Education Details Access Code: [email or web address]    Person(s) Educated Patient    Methods Explanation;Demonstration;Tactile cues;Verbal  cues;Handout    Comprehension Verbalized understanding;Returned demonstration                 PT Long Term Goals - 10/15/20 1532       PT LONG TERM GOAL #1   Title Pt will feel comfortable at work without feel like she has worry about leaking    Status On-going      PT LONG TERM GOAL #2   Title Pt will have at most a couple drips of leakage during activities that can cause leakage such as sneezing and bending.    Status On-going      PT LONG TERM GOAL #3   Title Pt will be able to use 1 pad per day at most    Status On-going                   Plan - 10/15/20 1508     Clinical Impression Statement Pt is feeling better overall.  Today's session focused more on core  strength to address diastasis rectus ab.  Pt was given ktape which seemed to helpmore with the upper abdomen.  Used star technique of taping. Pt did well with intial core exercises but fatigues quickly.  Pt will benefit from skilled PT to continue to address strength deficits for return to full function.    PT Treatment/Interventions ADLs/Self Care Home Management;Biofeedback;Cryotherapy;Electrical Stimulation;Moist Heat;Therapeutic activities;Therapeutic exercise;Neuromuscular re-education;Manual techniques;Patient/family education;Taping;Dry needling;Passive range of motion    PT Next Visit Plan ktape to DRA, core strength    PT Home Exercise Plan Access Code: [email or web address]    Consulted and Agree with Plan of Care Patient             Patient will benefit from skilled therapeutic intervention in order to improve the following deficits and impairments:  Decreased strength, Postural dysfunction, Decreased range of motion, Decreased coordination, Decreased endurance, Increased muscle spasms, Increased fascial restricitons, Other (comment)  Visit Diagnosis: No diagnosis found.     Problem List Patient Active Problem List   Diagnosis Date Noted   Diastasis recti 07/14/2020   Urinary incontinence  07/14/2020   SVD (spontaneous vaginal delivery) 05/26/2020   Post-dates pregnancy 05/25/2020   Anemia affecting pregnancy 01/30/2020   Gastric reflux 01/30/2020   Umbilical hernia without obstruction and without gangrene 01/02/2020   Supervision of low-risk pregnancy, second trimester 12/05/2019   Back pain affecting pregnancy 10/04/2019   Low grade squamous intraepith lesion on cytologic smear cervix (lgsil) 10/12/2016    Junious Silk, PT 10/15/2020, 5:33 PM  [facility] [address] [place], [place], [address] Phone: [phone]   Fax:  [phone]  Name: Briggitte Boline MRN: [id number] Date of Birth: [birth year]

## 2020-10-15 NOTE — Patient Instructions (Signed)
Access Code: [email or web address] URL: [email or web address] Date: 10/15/2020 Prepared by: Dwana Curd  Exercises Supine Hamstring Stretch with Strap - 1 x daily - 7 x weekly - 1 sets - 3 reps - 30 sec hold Supine Butterfly Groin Stretch - 1 x daily - 7 x weekly - 1 sets - 3 reps - 30 sec hold Supine Lower Trunk Rotation - 1 x daily - 7 x weekly - 1 sets - 10 reps - 5 sec hold Supine Pelvic Floor Stretch - 1 x daily - 7 x weekly - 1 sets - 3 reps - 30 sec hold Prone Quadriceps Stretch with Strap - 1 x daily - 7 x weekly - 1 sets - 3 reps - 30sec hold Prone Pelvic Floor Contraction with Heel Squeeze - 1 x daily - 7 x weekly - 3 sets - 10 reps Supine Hip Internal and External Rotation - 1 x daily - 7 x weekly - 10 reps - 1 sets - 5 sec hold Hip Flexor Stretch at Edge of Bed - 1 x daily - 7 x weekly - 3 sets - 10 reps Mini Squat with Pelvic Floor Contraction - 1 x daily - 7 x weekly - 2 sets - 10 reps Bent Knee Fallouts - 1 x daily - 7 x weekly - 3 sets - 10 reps Hooklying Transversus Abdominis Palpation - 1 x daily - 7 x weekly - 3 sets - 10 reps Supine Knees to Chest with Swiss Ball - 1 x daily - 7 x weekly - 3 sets - 10 reps Wall Push Up - 1 x daily - 7 x weekly - 3 sets - 10 reps

## 2020-10-28 ENCOUNTER — Ambulatory Visit: Payer: Medicaid Other | Attending: Obstetrics and Gynecology | Admitting: Physical Therapy

## 2020-10-28 ENCOUNTER — Other Ambulatory Visit: Payer: Self-pay

## 2020-10-28 DIAGNOSIS — M6281 Muscle weakness (generalized): Secondary | ICD-10-CM | POA: Insufficient documentation

## 2020-10-28 DIAGNOSIS — R252 Cramp and spasm: Secondary | ICD-10-CM | POA: Insufficient documentation
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
## 2020-11-05 ENCOUNTER — Other Ambulatory Visit: Payer: Self-pay

## 2020-11-05 ENCOUNTER — Encounter: Payer: Self-pay | Admitting: Physical Therapy

## 2020-11-05 ENCOUNTER — Ambulatory Visit: Payer: Medicaid Other | Admitting: Physical Therapy

## 2020-11-05 DIAGNOSIS — R252 Cramp and spasm: Secondary | ICD-10-CM

## 2020-11-05 DIAGNOSIS — M6281 Muscle weakness (generalized): Secondary | ICD-10-CM | POA: Diagnosis not present

## 2020-11-05 NOTE — Therapy (Signed)
[facility] [address], [address] [place], [place], [address] Phone: [phone]   Fax:  [phone]  Physical Therapy Treatment  Patient Details  Name: Lisa Reese MRN: [id number] Date of Birth: 06-03-1990 Referring Provider (PT): Gita Kudo, MD   Encounter Date: 11/05/2020   PT End of Session - 11/05/20 1612     Visit Number 7    Date for PT Re-Evaluation 12/01/20    Authorization Type healthy blue - put aut in for 12 visits until 8/16    Authorization - Visit Number 6    Authorization - Number of Visits 12    PT Start Time 1613    PT Stop Time 1655    PT Time Calculation (min) 42 min    Activity Tolerance Patient tolerated treatment well    Behavior During Therapy [facility] for tasks assessed/performed             Past Medical History:  Diagnosis Date   Hyperemesis gravidarum    Vaginal Pap smear, abnormal     Past Surgical History:  Procedure Laterality Date   NO PAST SURGERIES      There were no vitals filed for this visit.   Subjective Assessment - 11/05/20 1613     Subjective I think the tape helped last time.  No accidents                               [facility] Adult PT Treatment/Exercise - 11/05/20 0001       Neuro Re-ed    Neuro Re-ed Details  log roll technique with kegel and tactile cues for core engaged throughout      Lumbar Exercises: Aerobic   Nustep L3 x 8 min core engaged      Lumbar Exercises: Supine   Other Supine Lumbar Exercises ball overhead, rollout, bent knee raise, bend knee fall out - 15 x all TC and VC to perform correctly      Lumbar Exercises: Quadruped   Other Quadruped Lumbar Exercises push up 10x    Other Quadruped Lumbar Exercises rocking - 10x      Manual Therapy   Kinesiotex Facilitate Muscle      Kinesiotix   Facilitate Muscle  # placement to correct DRA                         PT Long Term Goals - 10/28/20 1553        PT LONG TERM GOAL #1   Title Pt will feel comfortable at work without feel like she has worry about leaking    Baseline better    Status On-going      PT LONG TERM GOAL #2   Title Pt will have at most a couple drips of leakage during activities that can cause leakage such as sneezing and bending.    Baseline sneezing caused full incontinence last week    Status On-going      PT LONG TERM GOAL #3   Title Pt will be able to use 1 pad per day at most    Status On-going      PT LONG TERM GOAL #4   Title Pt will be ind with advanced HEP to maintain core strength for functional activities    Status On-going      PT LONG TERM GOAL #5   Title Pt will demonstrate diastasis of rectus  reduced to <2 fingers and no depth or tenderness    Baseline 3 fingers with 1 knuckle deep    Status On-going                   Plan - 11/05/20 1634     Clinical Impression Statement Pt did well with exercises added today.  Pt needed cues for keeping ribcage from flaring during exercises.  She does well with ktape for DRA.  Overall, she is having less frequency and core is much stronger noted with easier palpation of transversus abdominus muscle.  Pt will benefit from skilled PT to continue to progress core strength and bladder control    PT Treatment/Interventions ADLs/Self Care Home Management;Biofeedback;Cryotherapy;Electrical Stimulation;Moist Heat;Therapeutic activities;Therapeutic exercise;Neuromuscular re-education;Manual techniques;Patient/family education;Taping;Dry needling;Passive range of motion    PT Next Visit Plan f/u on log roll and ex's with kegel weights; progress qped and cues for ribcage; re-check DRA    PT Home Exercise Plan Access Code: [email or web address]    Consulted and Agree with Plan of Care Patient             Patient will benefit from skilled therapeutic intervention in order to improve the following deficits and impairments:  Decreased strength, Postural dysfunction,  Decreased range of motion, Decreased coordination, Decreased endurance, Increased muscle spasms, Increased fascial restricitons, Other (comment)  Visit Diagnosis: Muscle weakness (generalized)  Cramp and spasm     Problem List Patient Active Problem List   Diagnosis Date Noted   Diastasis recti 07/14/2020   Urinary incontinence 07/14/2020   SVD (spontaneous vaginal delivery) 05/26/2020   Post-dates pregnancy 05/25/2020   Anemia affecting pregnancy 01/30/2020   Gastric reflux 01/30/2020   Umbilical hernia without obstruction and without gangrene 01/02/2020   Supervision of low-risk pregnancy, second trimester 12/05/2019   Back pain affecting pregnancy 10/04/2019   Low grade squamous intraepith lesion on cytologic smear cervix (lgsil) 10/12/2016    Junious Silk, PT 11/05/2020, 4:59 PM  [facility] [address], [address] [place], [place], [address] Phone: [phone]   Fax:  [phone]  Name: Lisa Reese MRN: [id number] Date of Birth: 10-18-1990

## 2020-11-12 ENCOUNTER — Ambulatory Visit: Payer: Medicaid Other | Admitting: Physical Therapy

## 2021-06-25 ENCOUNTER — Encounter (HOSPITAL_COMMUNITY): Payer: Self-pay | Admitting: Emergency Medicine

## 2021-06-25 ENCOUNTER — Emergency Department (HOSPITAL_COMMUNITY)
Admission: EM | Admit: 2021-06-25 | Discharge: 2021-06-25 | Disposition: A | Discharge status: Home / Self Care | Payer: Medicaid Other | Attending: Emergency Medicine | Admitting: Emergency Medicine

## 2021-06-25 ENCOUNTER — Other Ambulatory Visit: Payer: Self-pay

## 2021-06-25 ENCOUNTER — Emergency Department (HOSPITAL_COMMUNITY): Payer: Medicaid Other

## 2021-06-25 DIAGNOSIS — S20219A Contusion of unspecified front wall of thorax, initial encounter: Secondary | ICD-10-CM | POA: Insufficient documentation

## 2021-06-25 DIAGNOSIS — Y92410 Unspecified street and highway as the place of occurrence of the external cause: Secondary | ICD-10-CM | POA: Insufficient documentation

## 2021-06-25 LAB — BASIC METABOLIC PANEL
Anion gap: 9 (ref 5–15)
BUN: 12 mg/dL (ref 6–20)
CO2: 23 mmol/L (ref 22–32)
Calcium: 9.2 mg/dL (ref 8.9–10.3)
Chloride: 102 mmol/L (ref 98–111)
Creatinine, Ser: 0.75 mg/dL (ref 0.44–1.00)
GFR, Estimated: >60 mL/min (ref >60)
Glucose, Bld: 91 mg/dL (ref 70–99)
Potassium: 3.6 mmol/L (ref 3.5–5.1)
Sodium: 134 mmol/L — ABNORMAL LOW (ref 135–145)

## 2021-06-25 LAB — CBC
HCT: 36.4 % (ref 36.0–46.0)
Hemoglobin: 12.1 g/dL (ref 12.0–15.0)
MCH: 30.2 pg (ref 26.0–34.0)
MCHC: 33.2 g/dL (ref 30.0–36.0)
MCV: 90.8 fL (ref 80.0–100.0)
Platelets: 316 10*3/uL (ref 150–400)
RBC: 4.01 MIL/uL (ref 3.87–5.11)
RDW: 12 % (ref 11.5–15.5)
WBC: 5.2 10*3/uL (ref 4.0–10.5)
nRBC: 0 % (ref 0.0–0.2)

## 2021-06-25 LAB — I-STAT BETA HCG BLOOD, ED (MC, WL, AP ONLY): I-stat hCG, quantitative: <5 m[IU]/mL (ref <5)

## 2021-06-25 LAB — TROPONIN I (HIGH SENSITIVITY): Troponin I (High Sensitivity): 3 ng/L (ref <18)

## 2021-06-25 MED ORDER — IBUPROFEN 400 MG PO TABS
400.0000 mg | ORAL_TABLET | Freq: Once | ORAL | Status: AC
  Administered 2021-06-25: 400 mg via ORAL
  Filled 2021-06-25: qty 1

## 2021-06-25 MED ORDER — NAPROXEN 500 MG PO TABS: 500.0000 mg | ORAL_TABLET | Freq: Two times a day (BID) | ORAL | 0 refills | Status: AC

## 2021-06-25 MED ORDER — CYCLOBENZAPRINE HCL 10 MG PO TABS: 10.0000 mg | ORAL_TABLET | Freq: Two times a day (BID) | ORAL | 0 refills | Status: DC | PRN

## 2021-06-25 MED ORDER — HYDROCODONE-ACETAMINOPHEN 5-325 MG PO TABS
1.0000 | ORAL_TABLET | Freq: Once | ORAL | Status: AC
Start: 2021-06-25 — End: 2021-06-25
  Administered 2021-06-25: 1 via ORAL
  Filled 2021-06-25: qty 1
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
## 2021-06-25 NOTE — ED Notes (Signed)
Per MD second troponin not needed  °
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
## 2021-06-29 ENCOUNTER — Other Ambulatory Visit: Payer: Self-pay

## 2021-06-29 ENCOUNTER — Ambulatory Visit (HOSPITAL_COMMUNITY)
Admission: EM | Admit: 2021-06-29 | Discharge: 2021-06-29 | Disposition: A | Discharge status: Home / Self Care | Payer: Medicaid Other

## 2021-06-29 ENCOUNTER — Encounter (HOSPITAL_COMMUNITY): Payer: Self-pay

## 2021-06-29 DIAGNOSIS — R079 Chest pain, unspecified: Secondary | ICD-10-CM | POA: Diagnosis not present

## 2021-06-29 DIAGNOSIS — M25561 Pain in right knee: Secondary | ICD-10-CM | POA: Diagnosis not present

## 2021-06-29 DIAGNOSIS — M545 Low back pain, unspecified: Secondary | ICD-10-CM | POA: Diagnosis not present
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
## 2021-06-29 NOTE — Discharge Instructions (Signed)
I recommend that you take the naproxen during the day.  You can take that every 8 hours with food and water.  Take the cyclobenzaprine at bedtime to help with sleep and muscle spasm. ?Perform Epson salt soaks in your bathtub to help with soreness. ?Use ice for pain or swelling and heat for muscle spasm and stiffness.  Apply for 20 minutes, off for an hour and then repeat. ?Your medical chart was reviewed and all of your previous lab work and imaging was negative.  Your symptoms will eventually improve.  Follow-up as needed. ?

## 2021-07-06 IMAGING — CR DG CHEST 2V
2 series · 2 of 2 positions shown · non-contrast
Comparison: December 14, 2016

CLINICAL DATA: Chest pain and shortness of breath.

EXAM:
CHEST - 2 VIEW

[chest pa]
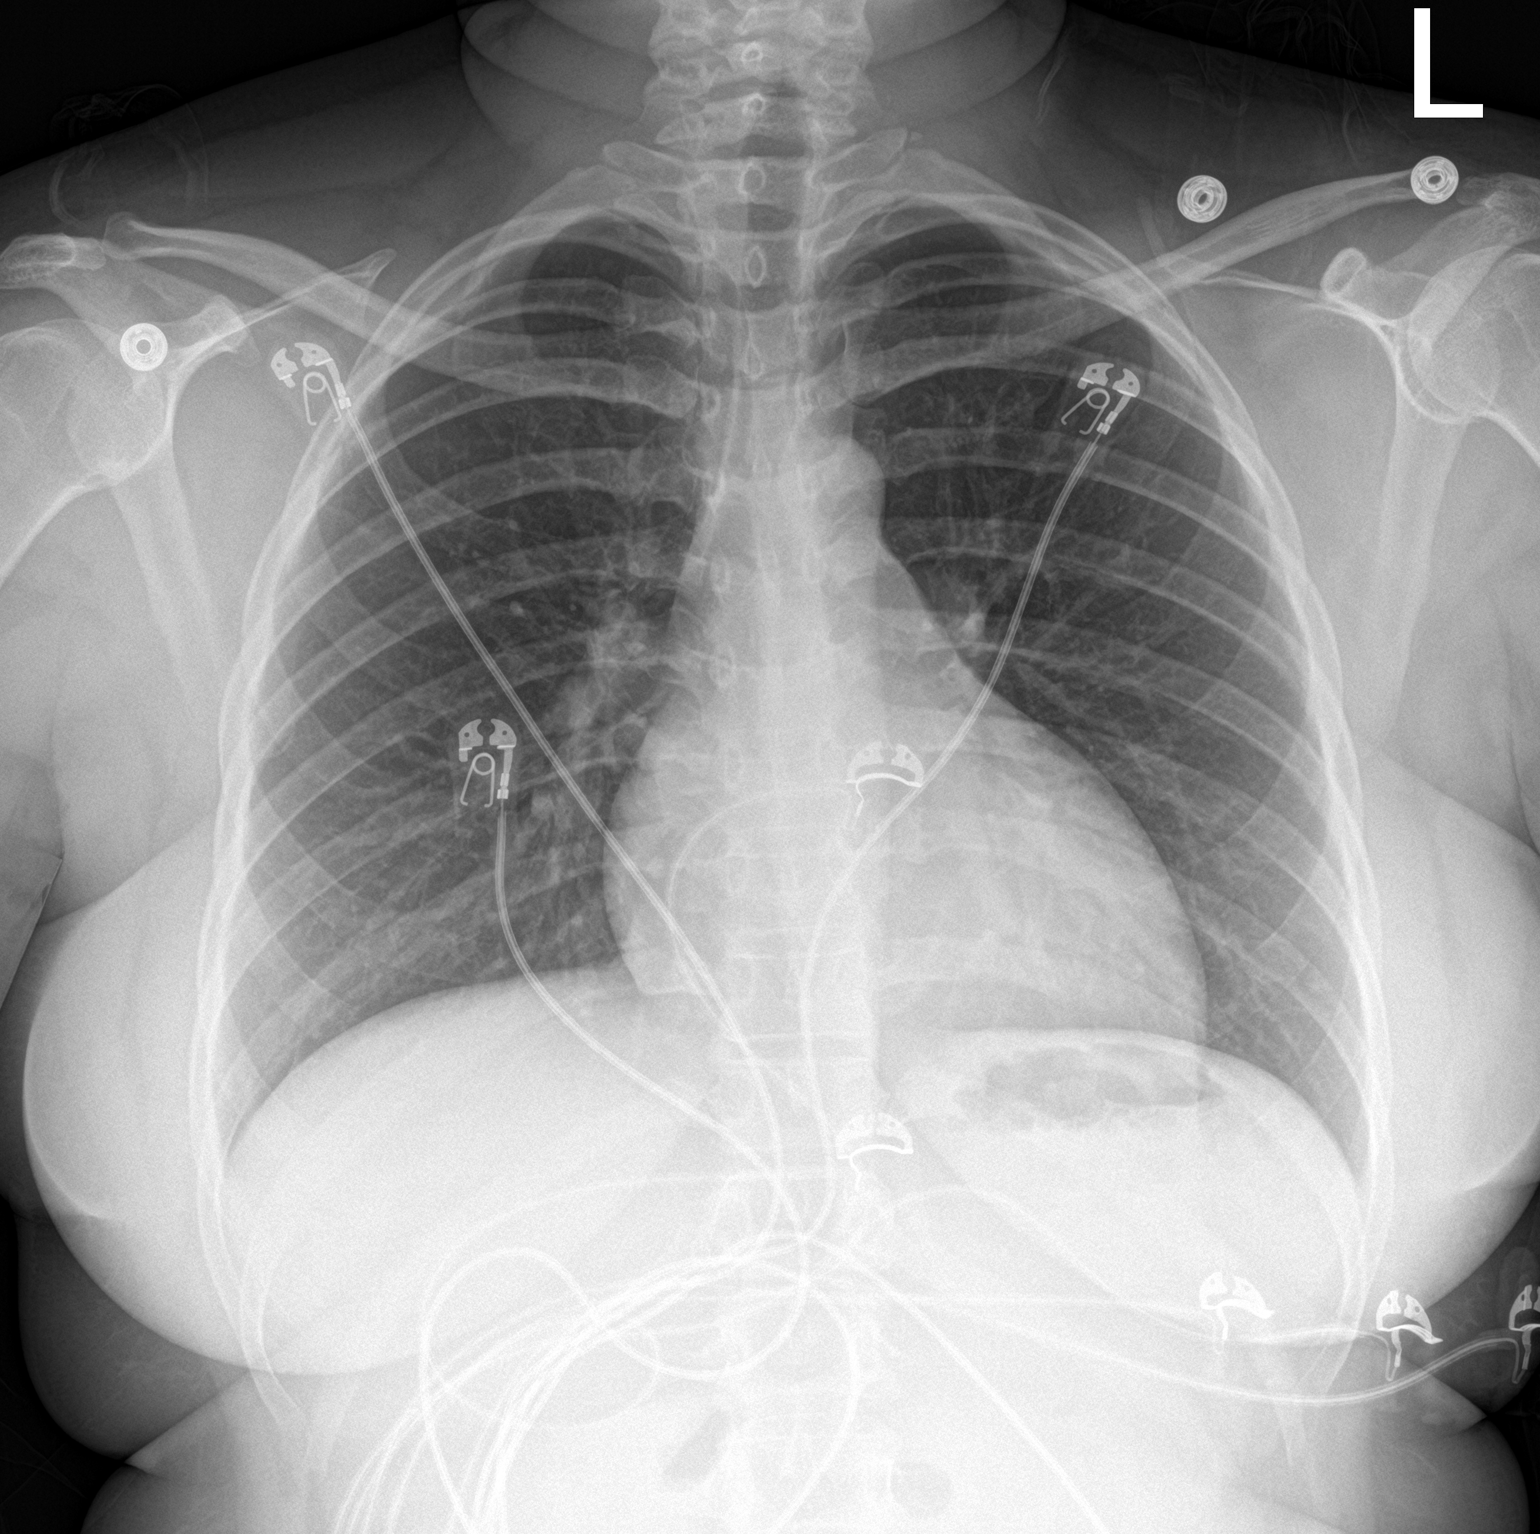

[chest lat]
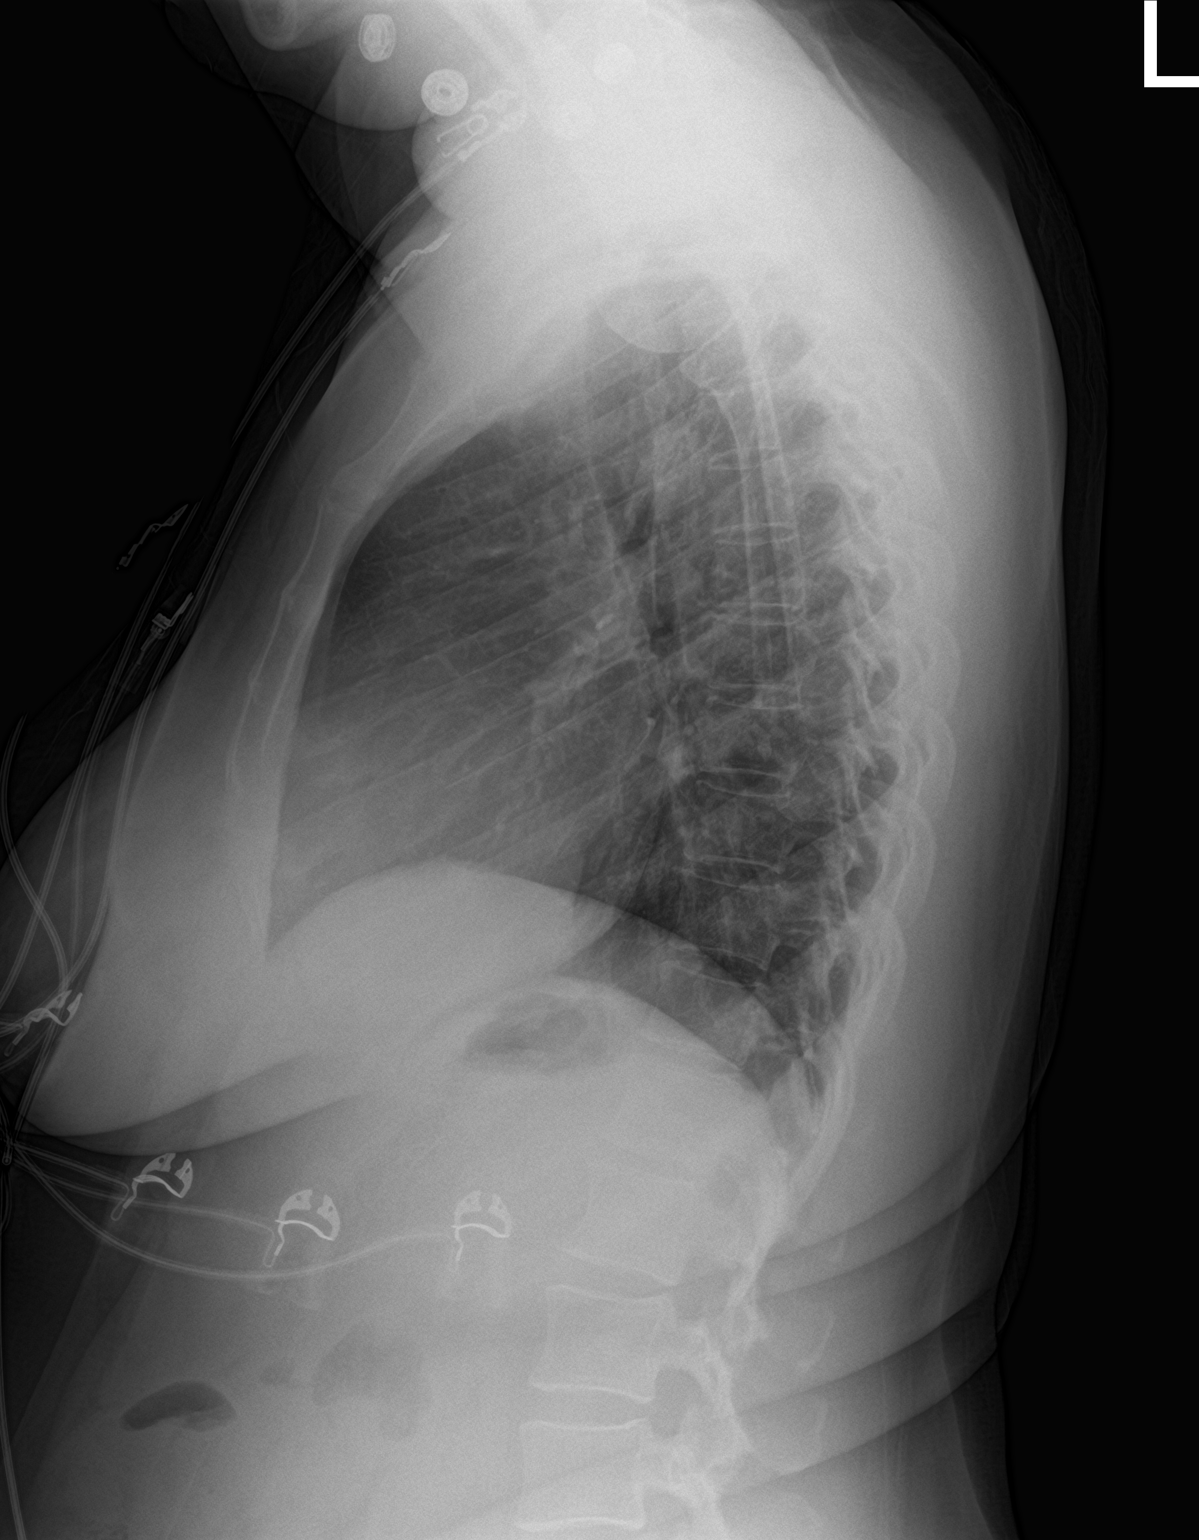

[2 of 2 positions shown; findings below may reference images not displayed]

FINDINGS: The heart size and mediastinal contours are within normal limits.
Both lungs are clear. The visualized skeletal structures are
unremarkable.
IMPRESSION: No active cardiopulmonary disease.

## 2021-09-12 ENCOUNTER — Inpatient Hospital Stay (HOSPITAL_COMMUNITY)
Admission: AD | Admit: 2021-09-12 | Discharge: 2021-09-12 | Disposition: A | Discharge status: Home / Self Care | Payer: Medicaid Other | Attending: Family Medicine | Admitting: Family Medicine

## 2021-09-12 ENCOUNTER — Other Ambulatory Visit: Payer: Self-pay

## 2021-09-12 DIAGNOSIS — N939 Abnormal uterine and vaginal bleeding, unspecified: Secondary | ICD-10-CM | POA: Diagnosis not present

## 2021-09-12 DIAGNOSIS — Z3202 Encounter for pregnancy test, result negative: Secondary | ICD-10-CM

## 2021-09-12 DIAGNOSIS — R87612 Low grade squamous intraepithelial lesion on cytologic smear of cervix (LGSIL): Secondary | ICD-10-CM

## 2021-09-12 DIAGNOSIS — R1030 Lower abdominal pain, unspecified: Secondary | ICD-10-CM | POA: Insufficient documentation

## 2021-09-12 LAB — POCT PREGNANCY, URINE: Preg Test, Ur: NEGATIVE

## 2021-09-12 LAB — HCG, QUANTITATIVE, PREGNANCY: hCG, Beta Chain, Quant, S: <1 m[IU]/mL (ref <5)

## 2021-09-12 MED ORDER — KETOROLAC TROMETHAMINE 10 MG PO TABS: 10.0000 mg | ORAL_TABLET | Freq: Four times a day (QID) | ORAL | 0 refills | Status: AC | PRN

## 2021-09-12 NOTE — MAU Note (Signed)
Lisa Reese is a 31 y.o. here in MAU reporting: had a normal period starting May 7. States she started bleeding again on May 21-24. Today started lower abdominal pain that she also feels in her rectum. Pain is intermittent.   Negative UPT at home  Went to [facility] today and they did not do anything and told her to come to MAU.  LMP: 08/22/2021  Onset of complaint: today  Pain score: 10/10  Vitals:   09/12/21 1800  BP: 132/81  Pulse: 85  Resp: 16  Temp: 98 F (36.7 C)  SpO2: 100%     Lab orders placed from triage: upt

## 2021-09-12 NOTE — MAU Provider Note (Signed)
History    CSN: [id number]  Arrival date and time: 09/12/21 1743  Event Date/Time  First Provider Initiated Contact with Patient 09/12/21 1915     Chief Complaint  Patient presents with   Abdominal Pain   HPI Taqwa Gauger is a 31 y.o. [id number] who presents to MAU with concern for miscarriage. Patient reports a regular menstrual cycle which began on 08/22/21. She started bleeding again on 09/05/21. She changed her pad 2-3 times during the business day. She also experienced abdominal pain during this episode of bleeding, which lasted until 09/08/21. She reports a negative pregnancy test.  On arrival to MAU patient states she has started bleeding again. She is using 2-3 pads during the day. She is not saturating pads. She is also reporting 10/10 lower abdominal pain. She has not taken medication for this complaint. She denies aggravating or alleviating factors. She is not experiencing weakness, dizziness or activity intolerance.  OB History     Gravida  2   Para  2   Term  2   Preterm  0   AB  0   Living  2      SAB  0   IAB  0   Ectopic  0   Multiple  0   Live Births  2           Past Medical History:  Diagnosis Date   Hyperemesis gravidarum    Vaginal Pap smear, abnormal     Past Surgical History:  Procedure Laterality Date   NO PAST SURGERIES      Family History  Problem Relation Age of Onset   Diabetes Father     Social History   Tobacco Use   Smoking status: Never   Smokeless tobacco: Never  Vaping Use   Vaping Use: Never used  Substance Use Topics   Alcohol use: No   Drug use: No    Allergies: No Known Allergies  Medications Prior to Admission  Medication Sig Dispense Refill Last Dose   acetaminophen (TYLENOL) 325 MG tablet Take 2 tablets (650 mg total) by mouth every 6 (six) hours as needed (for pain scale < 4). (Patient taking differently: Take 650 mg by mouth every 6 (six) hours as needed for moderate pain or headache (for  pain scale < 4).) 30 tablet 0    Blood Pressure KIT 1 Units by Does not apply route once a week. To monitor BP weekly for low risk pregnancy (z34.90) 1 kit 0    coconut oil OIL Apply 1 application topically as needed. (Patient taking differently: Apply 1 application topically as needed (dryness).) 25 mL 0    cyclobenzaprine (FLEXERIL) 10 MG tablet Take 1 tablet (10 mg total) by mouth 2 (two) times daily as needed for muscle spasms. 20 tablet 0    famotidine (PEPCID) 20 MG tablet Take 1 tablet (20 mg total) by mouth 2 (two) times daily. (Patient not taking: Reported on 07/14/2020) 60 tablet 1    hydrocortisone (ANUSOL-HC) 25 MG suppository Place 1 suppository (25 mg total) rectally 2 (two) times daily. (Patient not taking: No sig reported) 12 suppository 0    naproxen (NAPROSYN) 500 MG tablet Take 1 tablet (500 mg total) by mouth 2 (two) times daily. 20 tablet 0    vitamin C (ASCORBIC ACID) 500 MG tablet Take 500 mg by mouth daily.       Review of Systems  Gastrointestinal:  Positive for abdominal pain.  Genitourinary:  Positive for vaginal bleeding.  All other systems reviewed and are negative. Physical Exam   Blood pressure 132/81, pulse 85, temperature 98 F (36.7 C), temperature source Oral, resp. rate 16, SpO2 100 %, not currently breastfeeding.  Physical Exam Vitals and nursing note reviewed. Exam conducted with a chaperone present.  Constitutional:      Appearance: She is well-developed.  Cardiovascular:     Rate and Rhythm: Normal rate.     Heart sounds: Normal heart sounds.  Pulmonary:     Effort: Pulmonary effort is normal.  Abdominal:     Palpations: Abdomen is soft.  Skin:    Capillary Refill: Capillary refill takes less than 2 seconds.  Neurological:     Mental Status: She is alert and oriented to person, place, and time.  Psychiatric:        Mood and Affect: Mood normal.        Behavior: Behavior normal.    MAU Course  Procedures  MDM --Negative HPT, Negative  UPT in MAU. Patient offered quant hCG with goal of reducing possible triage time in [facility].  Patient Vitals for the past 24 hrs:  BP Temp Temp src Pulse Resp SpO2  09/12/21 1930 126/85 98.4 F (36.9 C) Oral 79 17 100 %  09/12/21 1800 132/81 98 F (36.7 C) Oral 85 16 100 %   Results for orders placed or performed during the hospital encounter of 09/12/21 (from the past 24 hour(s))  Pregnancy, urine POC     Status: None   Collection Time: 09/12/21  5:55 PM  Result Value Ref Range   Preg Test, Ur NEGATIVE NEGATIVE  hCG, quantitative, pregnancy     Status: None   Collection Time: 09/12/21  6:21 PM  Result Value Ref Range   hCG, Beta Chain, Quant, S <1 <5 mIU/mL   Meds ordered this encounter  Medications   ketorolac (TORADOL) 10 MG tablet    Sig: Take 1 tablet (10 mg total) by mouth every 6 (six) hours as needed.    Dispense:  20 tablet    Refill:  0    Order Specific Question:   Supervising Provider    Answer:   Donnamae Jude [[id number]]   Assessment and Plan  --31 y.o. [id number]  --Negative UPT --Quant hCG <1 --Abnormal Uterine Bleeding --Discussed indication for medication, ED evaluation if saturating a pad in one hour --Discharge home in stable condition  F/U: Patient to present to [facility] for Gyn problem visit  Darlina Rumpf, CNM 09/12/2021, 9:37 PM
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
## 2023-08-03 ENCOUNTER — Emergency Department
Admission: EM | Admit: 2023-08-03 | Discharge: 2023-08-03 | Disposition: A | Discharge status: Home / Self Care | Attending: Emergency Medicine | Admitting: Emergency Medicine

## 2023-08-03 ENCOUNTER — Other Ambulatory Visit: Payer: Self-pay

## 2023-08-03 DIAGNOSIS — J309 Allergic rhinitis, unspecified: Secondary | ICD-10-CM | POA: Insufficient documentation

## 2023-08-03 DIAGNOSIS — R0981 Nasal congestion: Secondary | ICD-10-CM | POA: Diagnosis present

## 2023-08-03 LAB — RESP PANEL BY RT-PCR (RSV, FLU A&B, COVID)  RVPGX2
Influenza A by PCR: NEGATIVE
Influenza B by PCR: NEGATIVE
Resp Syncytial Virus by PCR: NEGATIVE
SARS Coronavirus 2 by RT PCR: NEGATIVE

## 2023-08-03 MED ORDER — FLUTICASONE PROPIONATE 50 MCG/ACT NA SUSP: 2.0000 | Freq: Every day | NASAL | 0 refills | Status: AC

## 2023-08-03 MED ORDER — CETIRIZINE HCL 10 MG PO TABS: 10.0000 mg | ORAL_TABLET | Freq: Two times a day (BID) | ORAL | 2 refills | Status: AC | PRN

## 2023-08-03 NOTE — ED Triage Notes (Signed)
 Pt reports she has had nasal congestion for the past 3 days, denies cough or fever. Denies sore throat

## 2023-08-03 NOTE — ED Notes (Signed)
 ED Provider at bedside.

## 2023-08-03 NOTE — ED Provider Notes (Signed)
 [facility] Provider Note    Event Date/Time   First MD Initiated Contact with Patient 08/03/23 0149     (approximate)   History   Nasal Congestion   HPI  Lisa Reese is a 33 y.o. female   Past medical history of significant past medical history presents to the Emergency Department a few days of stuffy nose, itchy nose, clear discharge from her eyes, itchy eyes, itchy roof of mouth that is worse at night when she is at her new apartment but she moved into from February.  She denies any cough or fever.  There is no pain.    Physical Exam   Triage Vital Signs: ED Triage Vitals [08/03/23 0121]  Encounter Vitals Group     BP (!) 149/96     Systolic BP Percentile      Diastolic BP Percentile      Pulse Rate 82     Resp 18     Temp 97.8 F (36.6 C)     Temp src      SpO2 100 %     Weight 150 lb (68 kg)     Height 5\' 5"  (1.651 m)     Head Circumference      Peak Flow      Pain Score      Pain Loc      Pain Education      Exclude from Growth Chart     Most recent vital signs: Vitals:   08/03/23 0121 08/03/23 0236  BP: (!) 149/96 (!) 131/92  Pulse: 82 74  Resp: 18 18  Temp: 97.8 F (36.6 C) 98.7 F (37.1 C)  SpO2: 100% 100%    General: Awake, no distress.  CV:  Good peripheral perfusion.  Resp:  Normal effort.  Abd:  No distention.  Other:  She has clear discharge from both eyes, nasal congestion, and a normal-appearing oropharynx with no lesions exudates masses and she is maintaining her airway.  She is clear lungs.  No skin rash.   ED Results / Procedures / Treatments   Labs (all labs ordered are listed, but only abnormal results are displayed) Labs Reviewed  RESP PANEL BY RT-PCR (RSV, FLU A&B, COVID)  [id number]     I ordered and reviewed the above labs they are notable for negative for COVID flu and RSV   PROCEDURES:  Critical Care performed: No  Procedures   MEDICATIONS ORDERED IN ED: Medications - No data to  display  IMPRESSION / MDM / ASSESSMENT AND PLAN / ED COURSE  I reviewed the triage vital signs and the nursing notes.                                Patient's presentation is most consistent with acute, uncomplicated illness.  Differential diagnosis includes, but is not limited to, allergic rhinitis, considered but less likely sinusitis, respiratory infection   The patient is on the cardiac monitor to evaluate for evidence of arrhythmia and/or significant heart rate changes.  MDM:    I think she is got allergic rhinitis.  She has been using cough and cold medicines which have not been helping, so I have prescribed her intranasal corticosteroid as well as second-generation antihistamine to help with her symptoms.  I will make a referral to her PMD to follow-up with her.       FINAL CLINICAL IMPRESSION(S) / ED DIAGNOSES   Final  diagnoses:  Allergic rhinitis, unspecified seasonality, unspecified trigger     Rx / DC Orders   ED Discharge Orders          Ordered    fluticasone (FLONASE) 50 MCG/ACT nasal spray  Daily        08/03/23 0241    cetirizine (ZYRTEC ALLERGY) 10 MG tablet  2 times daily PRN        08/03/23 0241    Ambulatory Referral to Primary Care (Establish Care)        08/03/23 0305             Note:  This document was prepared using Dragon voice recognition software and may include unintentional dictation errors.    Buell Carmin, MD 08/03/23 [phone]

## 2023-08-03 NOTE — Discharge Instructions (Signed)
 Use Flonase and cetirizine as prescribed.  Have somebody take a look at your house for potential mold exposures and allergic reactions to this.  Thank you for choosing us  for your health care today!  Please see your primary doctor this week for a follow up appointment.   If you have any new, worsening, or unexpected symptoms call your doctor right away or come back to the emergency department for reevaluation.  It was my pleasure to care for you today.   Arron Large Margery Sheets, MD
